# Patient Record
Sex: Female | Born: 1945
Health system: Southern US, Community
[De-identification: ages and names within clinical notes are randomized; demographics above are authoritative.]

## PROBLEM LIST (undated history)

## (undated) DIAGNOSIS — E785 Hyperlipidemia, unspecified: Secondary | ICD-10-CM

## (undated) DIAGNOSIS — R0683 Snoring: Secondary | ICD-10-CM

## (undated) DIAGNOSIS — E079 Disorder of thyroid, unspecified: Secondary | ICD-10-CM

## (undated) DIAGNOSIS — T7840XA Allergy, unspecified, initial encounter: Secondary | ICD-10-CM

## (undated) DIAGNOSIS — Z86010 Personal history of colonic polyps: Secondary | ICD-10-CM

## (undated) DIAGNOSIS — E7800 Pure hypercholesterolemia, unspecified: Secondary | ICD-10-CM

## (undated) DIAGNOSIS — K5900 Constipation, unspecified: Secondary | ICD-10-CM

## (undated) DIAGNOSIS — E119 Type 2 diabetes mellitus without complications: Secondary | ICD-10-CM

## (undated) DIAGNOSIS — N611 Abscess of the breast and nipple: Secondary | ICD-10-CM

## (undated) DIAGNOSIS — I10 Essential (primary) hypertension: Secondary | ICD-10-CM

## (undated) HISTORY — DX: Essential (primary) hypertension: I10

## (undated) HISTORY — DX: Constipation, unspecified: K59.00

## (undated) HISTORY — DX: Type 2 diabetes mellitus without complications: E11.9

## (undated) HISTORY — DX: Pure hypercholesterolemia, unspecified: E78.00

## (undated) HISTORY — DX: Abscess of the breast and nipple: N61.1

## (undated) HISTORY — DX: Snoring: R06.83

## (undated) HISTORY — DX: Allergy, unspecified, initial encounter: T78.40XA

## (undated) HISTORY — DX: Disorder of thyroid, unspecified: E07.9

## (undated) HISTORY — PX: POLYPECTOMY: SHX149

## (undated) HISTORY — DX: Hyperlipidemia, unspecified: E78.5

## (undated) HISTORY — DX: Personal history of colonic polyps: Z86.010

## (undated) HISTORY — PX: COLONOSCOPY: SHX174

---

## 1991-05-06 HISTORY — PX: ABDOMINAL HYSTERECTOMY: SHX81

## 2008-12-04 ENCOUNTER — Ambulatory Visit: Payer: Self-pay | Admitting: Internal Medicine

## 2008-12-19 ENCOUNTER — Ambulatory Visit: Payer: Self-pay | Admitting: Internal Medicine

## 2008-12-19 ENCOUNTER — Encounter: Payer: Self-pay | Admitting: Internal Medicine

## 2008-12-25 ENCOUNTER — Encounter: Payer: Self-pay | Admitting: Internal Medicine

## 2011-05-15 DIAGNOSIS — Z1231 Encounter for screening mammogram for malignant neoplasm of breast: Secondary | ICD-10-CM | POA: Diagnosis not present

## 2011-06-04 ENCOUNTER — Ambulatory Visit (INDEPENDENT_AMBULATORY_CARE_PROVIDER_SITE_OTHER): Payer: Medicare Other | Admitting: Family Medicine

## 2011-06-04 ENCOUNTER — Encounter: Payer: Self-pay | Admitting: Family Medicine

## 2011-06-04 VITALS — BP 112/61 | HR 76 | Temp 98.1°F | Resp 16 | Ht 64.0 in | Wt 117.0 lb

## 2011-06-04 DIAGNOSIS — F17200 Nicotine dependence, unspecified, uncomplicated: Secondary | ICD-10-CM | POA: Diagnosis not present

## 2011-06-04 DIAGNOSIS — Z720 Tobacco use: Secondary | ICD-10-CM

## 2011-06-04 DIAGNOSIS — E785 Hyperlipidemia, unspecified: Secondary | ICD-10-CM | POA: Diagnosis not present

## 2011-06-04 DIAGNOSIS — E119 Type 2 diabetes mellitus without complications: Secondary | ICD-10-CM | POA: Diagnosis not present

## 2011-06-04 MED ORDER — VARENICLINE TARTRATE 0.5 MG PO TABS: 0.5000 mg | ORAL_TABLET | Freq: Two times a day (BID) | ORAL | Status: AC

## 2011-06-04 MED ORDER — VARENICLINE TARTRATE 1 MG PO TABS: 1.0000 mg | ORAL_TABLET | Freq: Two times a day (BID) | ORAL | Status: AC

## 2011-06-04 MED ORDER — PRAVASTATIN SODIUM 40 MG PO TABS: 40.0000 mg | ORAL_TABLET | Freq: Every day | ORAL | Status: DC

## 2011-06-04 NOTE — Patient Instructions (Signed)
Smoking Cessation This document explains the best ways for you to quit smoking and new treatments to help. It lists new medicines that can double or triple your chances of quitting and quitting for good. It also considers ways to avoid relapses and concerns you may have about quitting, including weight gain. NICOTINE: A POWERFUL ADDICTION If you have tried to quit smoking, you know how hard it can be. It is hard because nicotine is a very addictive drug. For some people, it can be as addictive as heroin or cocaine. Usually, people make 2 or 3 tries, or more, before finally being able to quit. Each time you try to quit, you can learn about what helps and what hurts. Quitting takes hard work and a lot of effort, but you can quit smoking. QUITTING SMOKING IS ONE OF THE MOST IMPORTANT THINGS YOU WILL EVER DO.  You will live longer, feel better, and live better.   The impact on your body of quitting smoking is felt almost immediately:   Within 20 minutes, blood pressure decreases. Pulse returns to its normal level.   After 8 hours, carbon monoxide levels in the blood return to normal. Oxygen level increases.   After 24 hours, chance of heart attack starts to decrease. Breath, hair, and body stop smelling like smoke.   After 48 hours, damaged nerve endings begin to recover. Sense of taste and smell improve.   After 72 hours, the body is virtually free of nicotine. Bronchial tubes relax and breathing becomes easier.   After 2 to 12 weeks, lungs can hold more air. Exercise becomes easier and circulation improves.   Quitting will reduce your risk of having a heart attack, stroke, cancer, or lung disease:   After 1 year, the risk of coronary heart disease is cut in half.   After 5 years, the risk of stroke falls to the same as a nonsmoker.   After 10 years, the risk of lung cancer is cut in half and the risk of other cancers decreases significantly.   After 15 years, the risk of coronary heart  disease drops, usually to the level of a nonsmoker.   If you are pregnant, quitting smoking will improve your chances of having a healthy baby.   The people you live with, especially your children, will be healthier.   You will have extra money to spend on things other than cigarettes.  FIVE KEYS TO QUITTING Studies have shown that these 5 steps will help you quit smoking and quit for good. You have the best chances of quitting if you use them together: 1. Get ready.  2. Get support and encouragement.  3. Learn new skills and behaviors.  4. Get medicine to reduce your nicotine addiction and use it correctly.  5. Be prepared for relapse or difficult situations. Be determined to continue trying to quit, even if you do not succeed at first.  1. GET READY  Set a quit date.   Change your environment.   Get rid of ALL cigarettes, ashtrays, matches, and lighters in your home, car, and place of work.   Do not let people smoke in your home.   Review your past attempts to quit. Think about what worked and what did not.   Once you quit, do not smoke. NOT EVEN A PUFF!  2. GET SUPPORT AND ENCOURAGEMENT Studies have shown that you have a better chance of being successful if you have help. You can get support in many ways.  Tell   your family, friends, and coworkers that you are going to quit and need their support. Ask them not to smoke around you.   Talk to your caregivers (doctor, dentist, nurse, pharmacist, psychologist, and/or smoking counselor).   Get individual, group, or telephone counseling and support. The more counseling you have, the better your chances are of quitting. Programs are available at local hospitals and health centers. Call your local health department for information about programs in your area.   Spiritual beliefs and practices may help some smokers quit.   Quit meters are small computer programs online or downloadable that keep track of quit statistics, such as amount  of "quit-time," cigarettes not smoked, and money saved.   Many smokers find one or more of the many self-help books available useful in helping them quit and stay off tobacco.  3. LEARN NEW SKILLS AND BEHAVIORS  Try to distract yourself from urges to smoke. Talk to someone, go for a walk, or occupy your time with a task.   When you first try to quit, change your routine. Take a different route to work. Drink tea instead of coffee. Eat breakfast in a different place.   Do something to reduce your stress. Take a hot bath, exercise, or read a book.   Plan something enjoyable to do every day. Reward yourself for not smoking.   Explore interactive web-based programs that specialize in helping you quit.  4. GET MEDICINE AND USE IT CORRECTLY Medicines can help you stop smoking and decrease the urge to smoke. Combining medicine with the above behavioral methods and support can quadruple your chances of successfully quitting smoking. The U.S. Food and Drug Administration (FDA) has approved 7 medicines to help you quit smoking. These medicines fall into 3 categories.  Nicotine replacement therapy (delivers nicotine to your body without the negative effects and risks of smoking):   Nicotine gum: Available over-the-counter.   Nicotine lozenges: Available over-the-counter.   Nicotine inhaler: Available by prescription.   Nicotine nasal spray: Available by prescription.   Nicotine skin patches (transdermal): Available by prescription and over-the-counter.   Antidepressant medicine (helps people abstain from smoking, but how this works is unknown):   Bupropion sustained-release (SR) tablets: Available by prescription.   Nicotinic receptor partial agonist (simulates the effect of nicotine in your brain):   Varenicline tartrate tablets: Available by prescription.   Ask your caregiver for advice about which medicines to use and how to use them. Carefully read the information on the package.    Everyone who is trying to quit may benefit from using a medicine. If you are pregnant or trying to become pregnant, nursing an infant, you are under age 18, or you smoke fewer than 10 cigarettes per day, talk to your caregiver before taking any nicotine replacement medicines.   You should stop using a nicotine replacement product and call your caregiver if you experience nausea, dizziness, weakness, vomiting, fast or irregular heartbeat, mouth problems with the lozenge or gum, or redness or swelling of the skin around the patch that does not go away.   Do not use any other product containing nicotine while using a nicotine replacement product.   Talk to your caregiver before using these products if you have diabetes, heart disease, asthma, stomach ulcers, you had a recent heart attack, you have high blood pressure that is not controlled with medicine, a history of irregular heartbeat, or you have been prescribed medicine to help you quit smoking.  5. BE PREPARED FOR RELAPSE OR   DIFFICULT SITUATIONS  Most relapses occur within the first 3 months after quitting. Do not be discouraged if you start smoking again. Remember, most people try several times before they finally quit.   You may have symptoms of withdrawal because your body is used to nicotine. You may crave cigarettes, be irritable, feel very hungry, cough often, get headaches, or have difficulty concentrating.   The withdrawal symptoms are only temporary. They are strongest when you first quit, but they will go away within 10 to 14 days.  Here are some difficult situations to watch for:  Alcohol. Avoid drinking alcohol. Drinking lowers your chances of successfully quitting.   Caffeine. Try to reduce the amount of caffeine you consume. It also lowers your chances of successfully quitting.   Other smokers. Being around smoking can make you want to smoke. Avoid smokers.   Weight gain. Many smokers will gain weight when they quit, usually  less than 10 pounds. Eat a healthy diet and stay active. Do not let weight gain distract you from your main goal, quitting smoking. Some medicines that help you quit smoking may also help delay weight gain. You can always lose the weight gained after you quit.   Bad mood or depression. There are a lot of ways to improve your mood other than smoking.  If you are having problems with any of these situations, talk to your caregiver. SPECIAL SITUATIONS AND CONDITIONS Studies suggest that everyone can quit smoking. Your situation or condition can give you a special reason to quit.  Pregnant women/new mothers: By quitting, you protect your baby's health and your own.   Hospitalized patients: By quitting, you reduce health problems and help healing.   Heart attack patients: By quitting, you reduce your risk of a second heart attack.   Lung, head, and neck cancer patients: By quitting, you reduce your chance of a second cancer.   Parents of children and adolescents: By quitting, you protect your children from illnesses caused by secondhand smoke.  QUESTIONS TO THINK ABOUT Think about the following questions before you try to stop smoking. You may want to talk about your answers with your caregiver.  Why do you want to quit?   If you tried to quit in the past, what helped and what did not?   What will be the most difficult situations for you after you quit? How will you plan to handle them?   Who can help you through the tough times? Your family? Friends? Caregiver?   What pleasures do you get from smoking? What ways can you still get pleasure if you quit?  Here are some questions to ask your caregiver:  How can you help me to be successful at quitting?   What medicine do you think would be best for me and how should I take it?   What should I do if I need more help?   What is smoking withdrawal like? How can I get information on withdrawal?  Quitting takes hard work and a lot of effort,  but you can quit smoking. FOR MORE INFORMATION  Smokefree.gov (http://www.smokefree.gov) provides free, accurate, evidence-based information and professional assistance to help support the immediate and long-term needs of people trying to quit smoking. Document Released: 04/15/2001 Document Revised: 01/01/2011 Document Reviewed: 02/05/2009 [employer] Patient Information 2012 [employer]. 

## 2011-06-04 NOTE — Progress Notes (Signed)
  Subjective:    Patient ID: Terry Armstrong, female    DOB: Apr 22, 1946, 66 y.o.   MRN: [id number]  HPI 66 yo AA female who is recently retired and her for 39-month follow-up. She has no complaints       and no adverse effects with the current medications. She is ready to quit smoking and requests        "the little pill to help her quit". She was given a prescription several months ago but never filled it.        She denies hypoglycemia.        Review of Systems  Constitutional: Negative.   Respiratory: Negative.   Cardiovascular: Negative.   Gastrointestinal: Negative.   Musculoskeletal: Negative.   Skin: Negative.   Neurological: Negative.        Objective:   Physical Exam  Vitals reviewed. Constitutional: She is oriented to person, place, and time. She appears well-developed and well-nourished.  HENT:  Head: Normocephalic and atraumatic.  Neck: Normal range of motion. No thyromegaly present.  Cardiovascular: Normal rate, regular rhythm, normal heart sounds and intact distal pulses.   Pulmonary/Chest: Effort normal and breath sounds normal.  Abdominal: Soft. She exhibits no mass. There is no tenderness.  Musculoskeletal: Normal range of motion. She exhibits no edema.       Diabetic foot exam: Negative for abnormal foot shape or color, ulcerations. Normal sensation to light touch.  Neurological: She is alert and oriented to person, place, and time.  Skin: Skin is warm and dry.  Psychiatric: She has a normal mood and affect. Thought content normal.          Assessment & Plan:   1. Type II or unspecified type diabetes mellitus without mention of complication, not stated as uncontrolled  Well controlled, cont. Current medications  2. Tobacco user  Strongly advised Tobacco cessation, Rx: Chantix.   Started smoking at age 69. Today, pt is interested in cessation. Wants to try Chantix.  3. Hyperlipidemia LDL goal < 100  Cont. Pravastatin at current dose pending lab results.

## 2011-06-05 LAB — COMPREHENSIVE METABOLIC PANEL
AST: 13 U/L (ref 0–37)
Albumin: 4.5 g/dL (ref 3.5–5.2)
Alkaline Phosphatase: 67 U/L (ref 39–117)
BUN: 12 mg/dL (ref 6–23)
Potassium: 4.5 mEq/L (ref 3.5–5.3)
Sodium: 140 mEq/L (ref 135–145)
Total Bilirubin: 0.5 mg/dL (ref 0.3–1.2)
Total Protein: 6.7 g/dL (ref 6.0–8.3)

## 2011-06-05 LAB — LIPID PANEL
HDL: 54 mg/dL (ref >39)
LDL Cholesterol: 98 mg/dL (ref 0–99)
VLDL: 18 mg/dL (ref 0–40)

## 2011-06-05 NOTE — Progress Notes (Signed)
Quick Note:  Please notify pt that results are normal. ______ 

## 2012-01-28 ENCOUNTER — Ambulatory Visit (INDEPENDENT_AMBULATORY_CARE_PROVIDER_SITE_OTHER): Payer: Medicare Other | Admitting: Family Medicine

## 2012-01-28 VITALS — BP 128/64 | HR 75 | Temp 97.8°F | Resp 16 | Ht 64.0 in | Wt 120.0 lb

## 2012-01-28 DIAGNOSIS — L723 Sebaceous cyst: Secondary | ICD-10-CM | POA: Diagnosis not present

## 2012-01-28 DIAGNOSIS — E785 Hyperlipidemia, unspecified: Secondary | ICD-10-CM | POA: Diagnosis not present

## 2012-01-28 DIAGNOSIS — Z23 Encounter for immunization: Secondary | ICD-10-CM

## 2012-01-28 DIAGNOSIS — E119 Type 2 diabetes mellitus without complications: Secondary | ICD-10-CM

## 2012-01-28 DIAGNOSIS — L729 Follicular cyst of the skin and subcutaneous tissue, unspecified: Secondary | ICD-10-CM

## 2012-01-28 LAB — POCT GLYCOSYLATED HEMOGLOBIN (HGB A1C): Hemoglobin A1C: 5.4

## 2012-01-28 MED ORDER — METFORMIN HCL 500 MG PO TABS: 500.0000 mg | ORAL_TABLET | Freq: Two times a day (BID) | ORAL | Status: DC

## 2012-01-28 MED ORDER — PRAVASTATIN SODIUM 40 MG PO TABS: 40.0000 mg | ORAL_TABLET | Freq: Every day | ORAL | Status: DC

## 2012-01-28 NOTE — Progress Notes (Signed)
[facility] [address] [place] [address] [phone]  Date:  01/28/2012   Name:  Terry Armstrong   DOB:  21-Jun-1945   MRN:  [id number]  PCP:  No primary provider on file.    Chief Complaint: Cyst, Medication Refill and Flu Vaccine   History of Present Illness:  Terry Armstrong is a 67 y.o. very pleasant female patient who presents with the following:  She has noted a tender nodule on her left temple for about 2 weeks.  It has been getting larger.  She has not been able to get it to drain or otherwise resolve as of yet - she has been picking, squeezing, and trying to get it to drain.   She also needs to follow- up on her DM today- she is not fasting so will not check an FLP but her FLP in January of this year looked great.   Also desires her flu shot today  Patient Active Problem List  Diagnosis  . Type II or unspecified type diabetes mellitus without mention of complication, not stated as uncontrolled  . Tobacco user  . Hyperlipidemia LDL goal < 100    No past medical history on file.  Past Surgical History  Procedure Date  . Abdominal hysterectomy 1993    History  Substance Use Topics  . Smoking status: Current Every Day Smoker -- 1.0 packs/day for 28 years    Types: Cigarettes  . Smokeless tobacco: Not on file  . Alcohol Use: Not on file    Family History  Problem Relation Age of Onset  . Diabetes    . Hypertension    . Hyperlipidemia      Allergies  Allergen Reactions  . Aspirin     Medication list has been reviewed and updated.  Current Outpatient Prescriptions on File Prior to Visit  Medication Sig Dispense Refill  . metFORMIN (GLUCOPHAGE) 500 MG tablet Take 500 mg by mouth 2 (two) times daily with a meal.      . pravastatin (PRAVACHOL) 40 MG tablet Take 1 tablet (40 mg total) by mouth daily.  90 tablet  3    Review of Systems:  As per HPI- otherwise negative. Otherwise she feels well today and has no other  complaint  Physical Examination: Filed Vitals:   01/28/12 1238  BP: 128/64  Pulse: 75  Temp: 97.8 F (36.6 C)  Resp: 16   Filed Vitals:   01/28/12 1238  Height: 5\' 4"  (1.626 m)  Weight: 120 lb (54.432 kg)   Body mass index is 20.60 kg/(m^2). Ideal Body Weight: Weight in (lb) to have BMI = 25: 145.3   GEN: WDWN, NAD, Non-toxic, A & O x 3 HEENT: Atraumatic, Normocephalic. Neck supple. No masses, No LAD.  There is a small, round, moveable nodule palpable under the skin of her left temple, consistent with a cyst.  No pore or head, no inflammation Ears and Nose: No external deformity. CV: RRR, No M/G/R. No JVD. No thrill. No extra heart sounds. PULM: CTA B, no wheezes, crackles, rhonchi. No retractions. No resp. distress. No accessory muscle use. EXTR: No c/c/e NEURO Normal gait.  PSYCH: Normally interactive. Conversant. Not depressed or anxious appearing.  Calm demeanor.   Results for orders placed in visit on 01/28/12  POCT GLYCOSYLATED HEMOGLOBIN (HGB A1C)      Component Value Range   Hemoglobin A1C 5.4      Assessment and Plan: 1. Hyperlipidemia LDL goal < 100  pravastatin (  PRAVACHOL) 40 MG tablet  2. DM (diabetes mellitus)  metFORMIN (GLUCOPHAGE) 500 MG tablet, Flu vaccine greater than or equal to 3yo preservative free IM, POCT glycosylated hemoglobin (Hb A1C)  3. Skin cyst     Refilled pravacol, and plan to recheck FLP after the holidays.   DM is well controlled- continue metformin Give flu vaccine Cyst on face: this may resolve with watchful waiting.  She will not pick at it- if not resolved in 2 weeks she will call and I will send her to derm to have this removed.   Abbe Amsterdam, MD

## 2012-02-03 DIAGNOSIS — L723 Sebaceous cyst: Secondary | ICD-10-CM | POA: Diagnosis not present

## 2012-03-25 ENCOUNTER — Ambulatory Visit (INDEPENDENT_AMBULATORY_CARE_PROVIDER_SITE_OTHER): Payer: Medicare Other | Admitting: Emergency Medicine

## 2012-03-25 ENCOUNTER — Ambulatory Visit: Payer: Medicare Other

## 2012-03-25 VITALS — BP 120/78 | HR 78 | Temp 98.6°F | Resp 18 | Ht 65.0 in | Wt 119.2 lb

## 2012-03-25 DIAGNOSIS — S6990XA Unspecified injury of unspecified wrist, hand and finger(s), initial encounter: Secondary | ICD-10-CM | POA: Diagnosis not present

## 2012-03-25 DIAGNOSIS — M79646 Pain in unspecified finger(s): Secondary | ICD-10-CM

## 2012-03-25 DIAGNOSIS — M79609 Pain in unspecified limb: Secondary | ICD-10-CM | POA: Diagnosis not present

## 2012-03-25 DIAGNOSIS — S6980XA Other specified injuries of unspecified wrist, hand and finger(s), initial encounter: Secondary | ICD-10-CM | POA: Diagnosis not present

## 2012-03-25 NOTE — Progress Notes (Signed)
  Subjective:    Patient ID: Terry Armstrong, female    DOB: [birth year], 66 y.o.   MRN: [id number]  HPIpresents today with R index finger injury. Bowling Tuesday am and finger got smashed in between the balls.    Review of Systems     Objective:   Physical Exam is tenderness and swelling over the flexor pad of the right index finger. She has mild decreased range of motion at the DIP joint. There is no instability   [facility] reading (PRIMARY) by  Dr. Cleta Alberts no fracture is seen       Assessment & Plan:  We'll treat with splint, ice, elevation

## 2012-04-19 ENCOUNTER — Ambulatory Visit (INDEPENDENT_AMBULATORY_CARE_PROVIDER_SITE_OTHER): Payer: Medicare Other | Admitting: Family Medicine

## 2012-04-19 ENCOUNTER — Encounter: Payer: Self-pay | Admitting: Family Medicine

## 2012-04-19 VITALS — BP 114/67 | HR 73 | Temp 98.4°F | Resp 16 | Ht 65.0 in | Wt 118.0 lb

## 2012-04-19 DIAGNOSIS — E785 Hyperlipidemia, unspecified: Secondary | ICD-10-CM | POA: Diagnosis not present

## 2012-04-19 DIAGNOSIS — R102 Pelvic and perineal pain: Secondary | ICD-10-CM

## 2012-04-19 DIAGNOSIS — N760 Acute vaginitis: Secondary | ICD-10-CM

## 2012-04-19 DIAGNOSIS — R350 Frequency of micturition: Secondary | ICD-10-CM | POA: Diagnosis not present

## 2012-04-19 DIAGNOSIS — F17200 Nicotine dependence, unspecified, uncomplicated: Secondary | ICD-10-CM | POA: Diagnosis not present

## 2012-04-19 DIAGNOSIS — K5900 Constipation, unspecified: Secondary | ICD-10-CM | POA: Diagnosis not present

## 2012-04-19 DIAGNOSIS — E119 Type 2 diabetes mellitus without complications: Secondary | ICD-10-CM | POA: Diagnosis not present

## 2012-04-19 DIAGNOSIS — B9689 Other specified bacterial agents as the cause of diseases classified elsewhere: Secondary | ICD-10-CM

## 2012-04-19 DIAGNOSIS — Z720 Tobacco use: Secondary | ICD-10-CM

## 2012-04-19 DIAGNOSIS — A499 Bacterial infection, unspecified: Secondary | ICD-10-CM | POA: Diagnosis not present

## 2012-04-19 LAB — POCT UA - MICROSCOPIC ONLY
Casts, Ur, LPF, POC: NEGATIVE
Crystals, Ur, HPF, POC: NEGATIVE
Mucus, UA: NEGATIVE
Yeast, UA: NEGATIVE

## 2012-04-19 LAB — POCT URINALYSIS DIPSTICK
Bilirubin, UA: NEGATIVE
Blood, UA: NEGATIVE
Glucose, UA: NEGATIVE
Ketones, UA: NEGATIVE
Nitrite, UA: NEGATIVE
Protein, UA: NEGATIVE
Spec Grav, UA: 1.025
Urobilinogen, UA: 2
pH, UA: 6

## 2012-04-19 LAB — POCT WET PREP WITH KOH: Clue Cells Wet Prep HPF POC: 100

## 2012-04-19 MED ORDER — CEFTRIAXONE SODIUM 1 G IJ SOLR
1.0000 g | Freq: Once | INTRAMUSCULAR | Status: AC
Start: 2012-04-19 — End: 2012-04-19
  Administered 2012-04-19: 1 g via INTRAMUSCULAR

## 2012-04-19 MED ORDER — METRONIDAZOLE 500 MG PO TABS: 500.0000 mg | ORAL_TABLET | Freq: Two times a day (BID) | ORAL | Status: DC

## 2012-04-19 MED ORDER — PHENAZOPYRIDINE HCL 200 MG PO TABS: 200.0000 mg | ORAL_TABLET | Freq: Three times a day (TID) | ORAL | Status: DC | PRN

## 2012-04-19 MED ORDER — POLYETHYLENE GLYCOL 3350 17 GM/SCOOP PO POWD: 17.0000 g | Freq: Two times a day (BID) | ORAL | Status: DC | PRN

## 2012-04-19 NOTE — Progress Notes (Signed)
Subjective:    Patient ID: Terry Armstrong, female    DOB: 1945-12-09, 66 y.o.   MRN: [id number]  HPI  Terry Armstrong is a delightful 66 yo woman who presents with 1 wk of dark urine and urinary odor - esp if she has not urinated in a while. No dysuria or increased frequency. She has had similar sxs with UTI in the past so she tried cranberry juice and water but didn't improve. She presented here today when she developed right pelvic pain.  Has been having some constipation recently and tried 3 laxative pills w/o much relief.   Has not had a pelvic exam since 2009 since she no longer needs pap smears and is still sexually active.   Had total HSO and BSO in 1986 - for fibroids so went through early menopausea, never had any hormone therapy after TAH/BSO despite young age.    No past medical history on file. Current Outpatient Prescriptions on File Prior to Visit  Medication Sig Dispense Refill  . metFORMIN (GLUCOPHAGE) 500 MG tablet Take 1 tablet (500 mg total) by mouth 2 (two) times daily with a meal.  180 tablet  3  . pravastatin (PRAVACHOL) 40 MG tablet Take 1 tablet (40 mg total) by mouth daily.  90 tablet  3   Past Surgical History  Procedure Date  . Abdominal hysterectomy - total w/ BSO - for fibroids, no malignancy 1986 vs 1994     Review of Systems  Constitutional: Positive for diaphoresis. Negative for fever, chills, activity change, appetite change and unexpected weight change.  Gastrointestinal: Positive for abdominal pain and constipation. Negative for nausea and vomiting.  Genitourinary: Positive for hematuria and pelvic pain. Negative for dysuria, frequency, flank pain, decreased urine volume, vaginal bleeding, vaginal discharge, difficulty urinating, genital sores and menstrual problem.  Musculoskeletal: Negative for back pain.      BP 114/67  Pulse 73  Temp 98.4 F (36.9 C) (Oral)  Resp 16  Ht 5\' 5"  (1.651 m)  Wt 118 lb (53.524 kg)  BMI 19.64 kg/m2  SpO2 99% Objective:    Physical Exam  Constitutional: She is oriented to person, place, and time. She appears well-developed and well-nourished. No distress.  HENT:  Head: Normocephalic and atraumatic.  Cardiovascular: Normal rate, regular rhythm, normal heart sounds and intact distal pulses.   Pulmonary/Chest: Effort normal and breath sounds normal.  Abdominal: Soft. Bowel sounds are normal. She exhibits no distension. There is no hepatosplenomegaly. There is tenderness in the right lower quadrant. There is no rigidity, no rebound, no guarding, no CVA tenderness, no tenderness at McBurney's point and negative Murphy's sign. No hernia.  Genitourinary: Rectum normal. Pelvic exam was performed with patient supine. There is no rash, tenderness or lesion on the right labia. There is no rash, tenderness or lesion on the left labia. Right adnexum displays tenderness. Right adnexum displays no mass and no fullness. Left adnexum displays no mass, no tenderness and no fullness. No erythema or tenderness around the vagina. Vaginal discharge found.       Cervix and uterus surgically absent  Lymphadenopathy:       Right: No inguinal adenopathy present.       Left: No inguinal adenopathy present.  Neurological: She is alert and oriented to person, place, and time.  Skin: Skin is warm and dry. She is not diaphoretic.  Psychiatric: She has a normal mood and affect. Her behavior is normal.      Results for orders placed in visit on  04/19/12  POCT UA - MICROSCOPIC ONLY      Component Value Range   WBC, Ur, HPF, POC 0-1     RBC, urine, microscopic 0-1     Bacteria, U Microscopic trace     Mucus, UA neg     Epithelial cells, urine per micros 0-3     Crystals, Ur, HPF, POC neg     Casts, Ur, LPF, POC neg     Yeast, UA neg    POCT URINALYSIS DIPSTICK      Component Value Range   Color, UA yellow     Clarity, UA clear     Glucose, UA neg     Bilirubin, UA neg     Ketones, UA neg     Spec Grav, UA 1.025     Blood, UA neg      pH, UA 6.0     Protein, UA neg     Urobilinogen, UA 2.0     Nitrite, UA neg     Leukocytes, UA Trace    IFOBT (OCCULT BLOOD)      Component Value Range   IFOBT Negative    POCT WET PREP WITH KOH      Component Value Range   Trichomonas, UA Negative     Clue Cells Wet Prep HPF POC 100%     Epithelial Wet Prep HPF POC 5-15     Yeast Wet Prep HPF POC neg     Bacteria Wet Prep HPF POC 4++     RBC Wet Prep HPF POC 10-20     WBC Wet Prep HPF POC 0-5     KOH Prep POC Negative     Assessment & Plan:   1. Urinary frequency  POCT UA - Microscopic Only, POCT urinalysis dipstick, phenazopyridine (PYRIDIUM) 200 MG tablet, Urine culture, cefTRIAXone (ROCEPHIN) injection 1 g.  UA looks completley normal but due to pt's hx, will give 1g Rocepin IM x 1 now to hold pt until urine clture results return before we put her on a full course of abx course.  2. Pelvic pain - ddx inc possible uti, constipation, or BV. UClx P, treat BM and constipation. If no improvement or ever worsens, rtc immed for further eval. IFOBT POC (occult bld, rslt in office) neg, POCT Wet Prep with KOH, GC/chlamydia probe amp, genital  3. Constipation  polyethylene glycol powder (GLYCOLAX/MIRALAX) powder  4. Hyperlipidemia LDL goal < 100    5. Tobacco user    6. Type II or unspecified type diabetes mellitus without mention of complication, not stated as uncontrolled    7. Bacterial vaginosis  metroNIDAZOLE (FLAGYL) 500 MG tablet

## 2012-04-19 NOTE — Patient Instructions (Signed)
Try miralax and colace (docusate) and drink plenty of water and a high fiber diet.

## 2012-05-19 DIAGNOSIS — Z1231 Encounter for screening mammogram for malignant neoplasm of breast: Secondary | ICD-10-CM | POA: Diagnosis not present

## 2012-10-29 ENCOUNTER — Ambulatory Visit (INDEPENDENT_AMBULATORY_CARE_PROVIDER_SITE_OTHER): Payer: Medicare Other | Admitting: Family Medicine

## 2012-10-29 VITALS — BP 100/70 | HR 75 | Temp 98.0°F | Resp 16 | Ht 66.0 in | Wt 118.0 lb

## 2012-10-29 DIAGNOSIS — H93239 Hyperacusis, unspecified ear: Secondary | ICD-10-CM

## 2012-10-29 DIAGNOSIS — H6123 Impacted cerumen, bilateral: Secondary | ICD-10-CM

## 2012-10-29 DIAGNOSIS — H6120 Impacted cerumen, unspecified ear: Secondary | ICD-10-CM | POA: Diagnosis not present

## 2012-10-29 DIAGNOSIS — H93233 Hyperacusis, bilateral: Secondary | ICD-10-CM

## 2012-10-29 NOTE — Progress Notes (Addendum)
Subjective: Patient is here complaining of not being able to hear. She says she has wax in her years. She tried to get it out unsuccessfully. Cannot hear  Objective: Bilateral plugs in her years. Throat is clear. Speaking loudly and cannot hear well  Assessment: Cerumen impaction Hearing loss temporary do to cerumen  Plan: Have my assistant irrigate ears out.  Recheck of ears reveals a eardrums look normal. Is just a tiny bit of residual wax or old dry skin in both canals. She was instructed in is about at home.

## 2012-10-29 NOTE — Patient Instructions (Addendum)
In a couple of days repeat using the ear wax drops that you haven't home and try to clean the remainder of the wax out of the years.  About every 4 months makes in a routine to try to flush ears out. Use earwax softening drops. Keep that on your calendar. That can help you do not have to come in to get this done.

## 2012-11-23 ENCOUNTER — Telehealth: Payer: Self-pay

## 2012-11-23 DIAGNOSIS — N63 Unspecified lump in unspecified breast: Secondary | ICD-10-CM | POA: Diagnosis not present

## 2012-11-23 NOTE — Telephone Encounter (Signed)
Spoke w/Dr Clelia Croft and she Ok'd order for MM but asked for me to call pt and let her know MM is only one aspect of assessment. She should still come in for exam possibly after the MM. Called pt and she thanked [place] and agreed to come in and see Dr Clelia Croft Thurs morning at 102. I am putting in order for MM.

## 2012-11-23 NOTE — Telephone Encounter (Signed)
[facility] called and stated pt has appt scheduled today at 1:45 for Dx MM left breast for swelling/possible cyst. Dr Clelia Croft, pt asked if you can refer her for this?

## 2012-11-23 NOTE — Telephone Encounter (Signed)
Needs a referral,today, for her appointment at 1.

## 2012-11-25 ENCOUNTER — Other Ambulatory Visit: Payer: Self-pay | Admitting: Radiology

## 2012-11-25 DIAGNOSIS — N6489 Other specified disorders of breast: Secondary | ICD-10-CM | POA: Diagnosis not present

## 2012-11-25 DIAGNOSIS — N61 Mastitis without abscess: Secondary | ICD-10-CM | POA: Diagnosis not present

## 2012-11-30 ENCOUNTER — Encounter (INDEPENDENT_AMBULATORY_CARE_PROVIDER_SITE_OTHER): Payer: Self-pay | Admitting: Surgery

## 2012-11-30 ENCOUNTER — Ambulatory Visit (INDEPENDENT_AMBULATORY_CARE_PROVIDER_SITE_OTHER): Payer: Medicare Other | Admitting: Surgery

## 2012-11-30 VITALS — BP 110/60 | HR 62 | Resp 14 | Ht 66.0 in | Wt 119.0 lb

## 2012-11-30 DIAGNOSIS — N611 Abscess of the breast and nipple: Secondary | ICD-10-CM

## 2012-11-30 DIAGNOSIS — N61 Mastitis without abscess: Secondary | ICD-10-CM | POA: Diagnosis not present

## 2012-11-30 NOTE — Progress Notes (Signed)
Chief Complaint:  Left breast abscess at 12:00 treated by Dr. Yolanda Bonine with biopsy and doxycycline  History of Present Illness:  Terry Armstrong is an 67 y.o. female referred by Dr. Isabell Jarvis having had a tender area in the left breast with some suspicious findings on mammogram that required biopsy which showed inflammatory changes. Meanwhile an apparent breast abscesses drained spontaneously and while on doxycycline is begun to completely resolve. At the 12:00 position there is a little area that slightly excoriated and still draining some.  I think that she should finish our course of doxycycline over the next 3 days. Continue using warm compresses and shower. As long as this infection has resolved I will not need to see her again.    Past Medical History  Diagnosis Date  . Diabetes mellitus without complication     Past Surgical History  Procedure Laterality Date  . Abdominal hysterectomy  1993    Current Outpatient Prescriptions  Medication Sig Dispense Refill  . fish oil-omega-3 fatty acids 1000 MG capsule Take 2 g by mouth daily.      . metFORMIN (GLUCOPHAGE) 500 MG tablet Take 500 mg by mouth 2 (two) times daily with a meal.      . metroNIDAZOLE (FLAGYL) 500 MG tablet Take 1 tablet (500 mg total) by mouth 2 (two) times daily with a meal. DO NOT CONSUME ALCOHOL WHILE TAKING THIS MEDICATION.  14 tablet  0  . multivitamin-iron-minerals-folic acid (CENTRUM) chewable tablet Chew 1 tablet by mouth daily.      . phenazopyridine (PYRIDIUM) 200 MG tablet Take 1 tablet (200 mg total) by mouth 3 (three) times daily as needed for pain.  10 tablet  0  . polyethylene glycol powder (GLYCOLAX/MIRALAX) powder Take 17 g by mouth 2 (two) times daily as needed.  527 g  5  . pravastatin (PRAVACHOL) 40 MG tablet Take 1 tablet (40 mg total) by mouth daily.  90 tablet  3   No current facility-administered medications for this visit.   Aspirin Family History  Problem Relation Age of Onset  . Diabetes    .  Hypertension    . Hyperlipidemia     Social History:   reports that she has been smoking Cigarettes.  She has a 28 pack-year smoking history. She has never used smokeless tobacco. She reports that she does not drink alcohol or use illicit drugs.   REVIEW OF SYSTEMS - PERTINENT POSITIVES ONLY: noncontributory  Physical Exam:   Blood pressure 110/60, pulse 62, resp. rate 14, height 5\' 6"  (1.676 m), weight 119 lb (53.978 kg). Body mass index is 19.22 kg/(m^2).  Gen:  WDWN AAF NAD  Neurological: Alert and oriented to person, place, and time. Motor and sensory function is grossly intact  Head: Normocephalic and atraumatic.  Eyes: Conjunctivae are normal. Pupils are equal, round, and reactive to light. No scleral icterus.  Breast: The left breast at 12 oclock has a 0.5 cm area of healing excoriation.  Nontender. Marland Kitchen  LABORATORY RESULTS: No results found for this or any previous visit (from the past 48 hour(s)).  RADIOLOGY RESULTS: No results found.  Problem List: Patient Active Problem List   Diagnosis Date Noted  . Type II or unspecified type diabetes mellitus without mention of complication, not stated as uncontrolled 06/04/2011  . Tobacco user 06/04/2011  . Hyperlipidemia LDL goal < 100 06/04/2011    Assessment & Plan: Healing left breast abscess  Complete course of Doxycycline Return prn    Matt B. Daphine Deutscher, MD,  [facility] [phone] beeper [phone]  11/30/2012 3:43 PM

## 2012-11-30 NOTE — Patient Instructions (Addendum)
Thanks for your patience.  If you need further assistance after leaving the office, please call our office and speak with a [facility] nurse.  [phone].  If you want to leave a message for Dr. Mabel Roll, please call his office phone at [phone]. 

## 2012-12-02 ENCOUNTER — Encounter: Payer: Self-pay | Admitting: Family Medicine

## 2012-12-06 ENCOUNTER — Encounter: Payer: Self-pay | Admitting: Family Medicine

## 2013-01-06 DIAGNOSIS — Z09 Encounter for follow-up examination after completed treatment for conditions other than malignant neoplasm: Secondary | ICD-10-CM | POA: Diagnosis not present

## 2013-01-10 ENCOUNTER — Encounter (INDEPENDENT_AMBULATORY_CARE_PROVIDER_SITE_OTHER): Payer: Self-pay

## 2013-01-17 ENCOUNTER — Encounter (INDEPENDENT_AMBULATORY_CARE_PROVIDER_SITE_OTHER): Payer: Self-pay

## 2013-01-19 ENCOUNTER — Encounter (INDEPENDENT_AMBULATORY_CARE_PROVIDER_SITE_OTHER): Payer: Self-pay

## 2013-01-21 ENCOUNTER — Encounter (INDEPENDENT_AMBULATORY_CARE_PROVIDER_SITE_OTHER): Payer: Self-pay

## 2013-01-28 ENCOUNTER — Encounter: Payer: Self-pay | Admitting: *Deleted

## 2013-01-28 DIAGNOSIS — N611 Abscess of the breast and nipple: Secondary | ICD-10-CM

## 2013-02-01 ENCOUNTER — Encounter (INDEPENDENT_AMBULATORY_CARE_PROVIDER_SITE_OTHER): Payer: Self-pay

## 2013-03-16 ENCOUNTER — Other Ambulatory Visit: Payer: Self-pay | Admitting: Family Medicine

## 2013-03-17 ENCOUNTER — Other Ambulatory Visit: Payer: Self-pay | Admitting: Family Medicine

## 2013-04-08 ENCOUNTER — Encounter: Payer: Self-pay | Admitting: Family Medicine

## 2013-04-08 ENCOUNTER — Ambulatory Visit (INDEPENDENT_AMBULATORY_CARE_PROVIDER_SITE_OTHER): Payer: Medicare Other | Admitting: Family Medicine

## 2013-04-08 VITALS — BP 152/80 | HR 77 | Temp 98.3°F | Resp 16 | Ht 64.0 in | Wt 120.2 lb

## 2013-04-08 DIAGNOSIS — Z720 Tobacco use: Secondary | ICD-10-CM

## 2013-04-08 DIAGNOSIS — R5381 Other malaise: Secondary | ICD-10-CM | POA: Diagnosis not present

## 2013-04-08 DIAGNOSIS — Z23 Encounter for immunization: Secondary | ICD-10-CM

## 2013-04-08 DIAGNOSIS — R0609 Other forms of dyspnea: Secondary | ICD-10-CM

## 2013-04-08 DIAGNOSIS — E785 Hyperlipidemia, unspecified: Secondary | ICD-10-CM

## 2013-04-08 DIAGNOSIS — F17200 Nicotine dependence, unspecified, uncomplicated: Secondary | ICD-10-CM | POA: Diagnosis not present

## 2013-04-08 DIAGNOSIS — R0683 Snoring: Secondary | ICD-10-CM

## 2013-04-08 DIAGNOSIS — M654 Radial styloid tenosynovitis [de Quervain]: Secondary | ICD-10-CM

## 2013-04-08 DIAGNOSIS — M899 Disorder of bone, unspecified: Secondary | ICD-10-CM

## 2013-04-08 DIAGNOSIS — R030 Elevated blood-pressure reading, without diagnosis of hypertension: Secondary | ICD-10-CM

## 2013-04-08 DIAGNOSIS — E119 Type 2 diabetes mellitus without complications: Secondary | ICD-10-CM

## 2013-04-08 DIAGNOSIS — M8580 Other specified disorders of bone density and structure, unspecified site: Secondary | ICD-10-CM

## 2013-04-08 LAB — COMPREHENSIVE METABOLIC PANEL
AST: 15 U/L (ref 0–37)
Albumin: 4.4 g/dL (ref 3.5–5.2)
Alkaline Phosphatase: 68 U/L (ref 39–117)
BUN: 13 mg/dL (ref 6–23)
Calcium: 9.4 mg/dL (ref 8.4–10.5)
Chloride: 105 mEq/L (ref 96–112)
Creat: 0.61 mg/dL (ref 0.50–1.10)

## 2013-04-08 LAB — CBC WITH DIFFERENTIAL/PLATELET
Hemoglobin: 14.2 g/dL (ref 12.0–15.0)
Lymphocytes Relative: 30 % (ref 12–46)
Lymphs Abs: 2.5 10*3/uL (ref 0.7–4.0)
Monocytes Relative: 6 % (ref 3–12)
Neutro Abs: 5.1 10*3/uL (ref 1.7–7.7)
Neutrophils Relative %: 60 % (ref 43–77)
RBC: 4.29 MIL/uL (ref 3.87–5.11)
WBC: 8.3 10*3/uL (ref 4.0–10.5)

## 2013-04-08 LAB — LIPID PANEL
Cholesterol: 214 mg/dL — ABNORMAL HIGH (ref 0–200)
HDL: 70 mg/dL (ref >39)
LDL Cholesterol: 132 mg/dL — ABNORMAL HIGH (ref 0–99)
Total CHOL/HDL Ratio: 3.1 Ratio
Triglycerides: 59 mg/dL (ref <150)
VLDL: 12 mg/dL (ref 0–40)

## 2013-04-08 LAB — TSH: TSH: 0.306 u[IU]/mL — ABNORMAL LOW (ref 0.350–4.500)

## 2013-04-08 LAB — MICROALBUMIN / CREATININE URINE RATIO: Microalb Creat Ratio: 5.6 mg/g (ref 0.0–30.0)

## 2013-04-08 MED ORDER — ZOSTER VACCINE LIVE 19400 UNT/0.65ML ~~LOC~~ SOLR: 0.6500 mL | Freq: Once | SUBCUTANEOUS | Status: DC

## 2013-04-08 MED ORDER — METFORMIN HCL 500 MG PO TABS: 500.0000 mg | ORAL_TABLET | Freq: Every day | ORAL | Status: DC

## 2013-04-08 NOTE — Patient Instructions (Addendum)
I am seen at [facility] on [facility] - they are very good.  You could also go to [facility] or any other eye doctor that you prefer.  Recommend making an appointment for a wellness exam in 6 mos.  Check your blood pressure at home once a week to ensure it is normal - <130/85. If it is elevated, please return to clinic so we can discuss the need for a blood pressure medication.  Ice the area of left wrist pain at the base of your thumb 3-4 times/day for 20 minutes and wear the thumb spica splint for at least 3 to 4 weeks. After your pain is completely gone, you can take of the splint and start doing stretching exercises with the wrist. If the pain does not go away in 1 month despite icing and splint, please return to clinic. OK to use otc ibuprofen or aleve as needed.  De Quervain's Disease Suzette Battiest disease is a condition often seen in racquet sports where there is a soreness (inflammation) in the cord like structures (tendons) which attach muscle to bone on the thumb side of the wrist. There may be a tightening of the tissuesaround the tendons. This condition is often helped by giving up or modifying the activity which caused it. When conservative treatment does not help, surgery may be required. Conservative treatment could include changes in the activity which brought about the problem or made it worse. Anti-inflammatory medications and injections may be used to help decrease the inflammation and help with pain control. Your caregiver will help you determine which is best for you. DIAGNOSIS  Often the diagnosis (learning what is wrong) can be made by examination. Sometimes x-rays are required. HOME CARE INSTRUCTIONS   Apply ice to the sore area for 15-20 minutes, 03-04 times per day while awake. Put the ice in a plastic bag and place a towel between the bag of ice and your skin. This is especially helpful if it can be done after all activities involving the sore wrist.  Temporary  splinting may help.  Only take over-the-counter or prescription medicines for pain, discomfort or fever as directed by your caregiver. SEEK MEDICAL CARE IF:   Pain relief is not obtained with medications, or if you have increasing pain and seem to be getting worse rather than better. MAKE SURE YOU:   Understand these instructions.  Will watch your condition.  Will get help right away if you are not doing well or get worse. Document Released: 01/14/2001 Document Revised: 07/14/2011 Document Reviewed: 04/21/2005 [facility] Patient Information 2014 [place], [place].   DASH Diet The DASH diet stands for "Dietary Approaches to Stop Hypertension." It is a healthy eating plan that has been shown to reduce high blood pressure (hypertension) in as little as 14 days, while also possibly providing other significant health benefits. These other health benefits include reducing the risk of breast cancer after menopause and reducing the risk of type 2 diabetes, heart disease, colon cancer, and stroke. Health benefits also include weight loss and slowing kidney failure in patients with chronic kidney disease.  DIET GUIDELINES  Limit salt (sodium). Your diet should contain less than 1500 mg of sodium daily.  Limit refined or processed carbohydrates. Your diet should include mostly whole grains. Desserts and added sugars should be used sparingly.  Include small amounts of heart-healthy fats. These types of fats include nuts, oils, and tub margarine. Limit saturated and trans fats. These fats have been shown to be harmful in the  body. CHOOSING FOODS  The following food groups are based on a 2000 calorie diet. See your Registered Dietitian for individual calorie needs. Grains and Grain Products (6 to 8 servings daily)  Eat More Often: Whole-wheat bread, brown rice, whole-grain or wheat pasta, quinoa, popcorn without added fat or salt (air popped).  Eat Less Often: White bread, white pasta, white rice,  cornbread. Vegetables (4 to 5 servings daily)  Eat More Often: Fresh, frozen, and canned vegetables. Vegetables may be raw, steamed, roasted, or grilled with a minimal amount of fat.  Eat Less Often/Avoid: Creamed or fried vegetables. Vegetables in a cheese sauce. Fruit (4 to 5 servings daily)  Eat More Often: All fresh, canned (in natural juice), or frozen fruits. Dried fruits without added sugar. One hundred percent fruit juice ( cup [237 mL] daily).  Eat Less Often: Dried fruits with added sugar. Canned fruit in light or heavy syrup. [employer], Fish, and Poultry (2 servings or less daily. One serving is 3 to 4 oz [85-114 g]).  Eat More Often: Ninety percent or leaner ground beef, tenderloin, sirloin. Round cuts of beef, chicken breast, [place] breast. All fish. Grill, bake, or broil your meat. Nothing should be fried.  Eat Less Often/Avoid: Fatty cuts of meat, [place], or chicken leg, thigh, or wing. Fried cuts of meat or fish. Dairy (2 to 3 servings)  Eat More Often: Low-fat or fat-free milk, low-fat plain or light yogurt, reduced-fat or part-skim cheese.  Eat Less Often/Avoid: Milk (whole, 2%).Whole milk yogurt. Full-fat cheeses. Nuts, Seeds, and Legumes (4 to 5 servings per week)  Eat More Often: All without added salt.  Eat Less Often/Avoid: Salted nuts and seeds, canned beans with added salt. Fats and Sweets (limited)  Eat More Often: Vegetable oils, tub margarines without trans fats, sugar-free gelatin. Mayonnaise and salad dressings.  Eat Less Often/Avoid: Coconut oils, palm oils, butter, stick margarine, cream, half and half, cookies, candy, pie. FOR MORE INFORMATION The Dash Diet Eating Plan: www.dashdiet.org Document Released: 04/10/2011 Document Revised: 07/14/2011 Document Reviewed: 04/10/2011 [facility] Patient Information 2014 [place], [place].

## 2013-04-08 NOTE — Progress Notes (Signed)
Subjective:    Patient ID: Terry Armstrong, female    DOB: Feb 08, 1946, 67 y.o.   MRN: [id number] Chief Complaint  Patient presents with  . Follow-up    blood pressure     HPI  Does have a BP cuff at home which her husband uses and is used to cooking w/ a low salt diet due to her husbands HTN.   Not checking cbgs outside of the office.  On metformin 500 bid. Fasting today.  Smoking a little less than 1 ppd. Chantix was very expensive and stopped working as soon as she went off of it after the first mo. No claudication  Moved here in 2009 from [place] - had shingles around 2006-7.  Aspirin caused itching and swelling.  No eye exam since 2009. Taking fish oil everday. Taking good care of feet. Feet stay cold all the time but no numbness or tingling.  Husband tells her that she stops breathing overnight, occasinally snore so bad he has to wake her up. Wakes up fatigued. No daytime napping but stays very busy.  C/o 2 wks of pain in her left radial styloid aspect of wrist that shoots up her forearm - throbbing, trouble grasping, dropping things, really hurts her at night.  Would like a stress test - is concerned that she is at increased risk for heart disease due to her medical comorbidities and smoking, is asymptomatic.  Pneumovax 2012 TDaP 2010 No prior zostavax - gave rx today. Mammogram with biopsies done at [facility] Colonoscopy: DEXA: was ordered on 05/2011 but looks like never done - last DEXA scan per [facility] was 01/10/2009  Past Medical History  Diagnosis Date  . Diabetes mellitus without complication   . Breast abscess   . Hyperlipidemia   . Primary snoring    Past Surgical History  Procedure Laterality Date  . Abdominal hysterectomy  1993   Current Outpatient Prescriptions on File Prior to Visit  Medication Sig Dispense Refill  . multivitamin-iron-minerals-folic acid (CENTRUM) chewable tablet Chew 1 tablet by mouth daily.      . polyethylene glycol powder  (GLYCOLAX/MIRALAX) powder Take 17 g by mouth 2 (two) times daily as needed.  527 g  5  . pravastatin (PRAVACHOL) 40 MG tablet Take 1 tablet (40 mg total) by mouth daily. PATIENT NEEDS OFFICE VISIT FOR ADDITIONAL REFILLS  30 tablet  0   No current facility-administered medications on file prior to visit.   Allergies  Allergen Reactions  . Aspirin     Swelling, scratching in throat   Family History  Problem Relation Age of Onset  . Diabetes    . Hypertension Sister   . Hyperlipidemia    . Heart disease Sister     defibrillator  . Kidney failure Maternal Aunt     dialysis  . Hypertension Maternal Aunt   . Heart Problems Maternal Grandmother   . Cancer Maternal Grandmother    History   Social History  . Marital Status: Single    Spouse Name: N/A    Number of Children: 0  . Years of Education: 14   Occupational History  .     Social History Main Topics  . Smoking status: Current Every Day Smoker -- 1.00 packs/day for 28 years    Types: Cigarettes  . Smokeless tobacco: Never Used  . Alcohol Use: Yes     Comment: social  . Drug Use: No  . Sexual Activity: None   Other Topics Concern  . None  Social History Narrative   Patient lives with her significant other.   Patient is retired.   Patient drinks 2-3 cups of caffeine daily in the winter.   Patient has a college education.   Patient is right-handed.          Review of Systems  Constitutional: Positive for fatigue. Negative for fever, chills, diaphoresis and appetite change.  Eyes: Negative for visual disturbance.  Respiratory: Negative for cough and shortness of breath.   Cardiovascular: Negative for chest pain, palpitations and leg swelling.  Endocrine: Positive for cold intolerance.  Genitourinary: Negative for decreased urine volume.  Neurological: Negative for syncope, weakness, numbness and headaches.  Hematological: Does not bruise/bleed easily.      BP 152/80  Pulse 77  Temp(Src) 98.3 F (36.8 C)  (Oral)  Resp 16  Ht 5\' 4"  (1.626 m)  Wt 120 lb 3.2 oz (54.522 kg)  BMI 20.62 kg/m2  SpO2 100% Objective:   Physical Exam  Constitutional: She is oriented to person, place, and time. She appears well-developed and well-nourished. No distress.  HENT:  Head: Normocephalic and atraumatic.  Right Ear: External ear normal.  Left Ear: External ear normal.  Eyes: Conjunctivae are normal. No scleral icterus.  Neck: Normal range of motion. Neck supple. No thyromegaly present.  Cardiovascular: Normal rate, regular rhythm, normal heart sounds and intact distal pulses.   Pulmonary/Chest: Effort normal and breath sounds normal. No respiratory distress.  Musculoskeletal: She exhibits no edema.  Lymphadenopathy:    She has no cervical adenopathy.  Neurological: She is alert and oriented to person, place, and time.  Skin: Skin is warm and dry. She is not diaphoretic. No erythema.  Psychiatric: She has a normal mood and affect. Her behavior is normal.      Assessment & Plan:  Need for prophylactic vaccination and inoculation against influenza - Plan: Flu Vaccine QUAD 36+ mos IM  Elevated blood pressure - Plan: Ambulatory referral to Cardiology - check BP outside of the office, record, and bring to f/u.  Snoring disorder - Plan: Ambulatory referral to Sleep Studies  Other malaise and fatigue - Plan: Ambulatory referral to Sleep Studies, TSH, CBC with Differential  Type II or unspecified type diabetes mellitus without mention of complication, not stated as uncontrolled - Plan: Ambulatory referral to Ophthalmology, POCT glycosylated hemoglobin (Hb A1C), Comprehensive metabolic panel, Microalbumin/Creatinine Ratio, Urine, Ambulatory referral to CardiologyDecrease metformin from 500  - bid to qd as a1c fantastic at 5.4. Recheck in 6 mos and consider stopping. Foot exam today, rfer to optho, retry asa 81mg .  Hyperlipidemia LDL goal < 100 - Plan: Lipid panel, Ambulatory referral to Cardiology - Refill  pravastin after lipid panel returns.  Tobacco user - Plan: Vit D  25 hydroxy (rtn osteoporosis monitoring), Ambulatory referral to Cardiology  Disorder of bone and cartilage  - Plan: Vit D  25 hydroxy (rtn osteoporosis monitoring)  De Quervain's disease (radial styloid tenosynovitis)  Need for shingles vaccine - Plan: zoster vaccine live, PF, (ZOSTAVAX) 16109 UNT/0.65ML injection  Osteopenia - Plan: DG Bone Density - last DEXA was 01/10/2009 despite order in chart in Jan 2013. D[facility] from [facility] was put to scanned in to Epic but Rt hip T score was -2.4 so borderline osteoporosis  HM - recommend pt make appt for wellness visit at her f/u in 6 mos.  Over 40 minutes spent in face to face evaluation of patient. Additional time spent in coordinating pt care.  Meds ordered this encounter  Medications  .  zoster vaccine live, PF, (ZOSTAVAX) 21308 UNT/0.65ML injection    Sig: Inject 19,400 Units into the skin once.    Dispense:  1 each    Refill:  0  . metFORMIN (GLUCOPHAGE) 500 MG tablet    Sig: Take 1 tablet (500 mg total) by mouth daily with breakfast.    Dispense:  90 tablet    Refill:  3    Norberto Sorenson, MD MPH

## 2013-04-09 LAB — VITAMIN D 25 HYDROXY (VIT D DEFICIENCY, FRACTURES): Vit D, 25-Hydroxy: 65 ng/mL (ref 30–89)

## 2013-05-11 ENCOUNTER — Ambulatory Visit (INDEPENDENT_AMBULATORY_CARE_PROVIDER_SITE_OTHER): Payer: Medicare Other | Admitting: Internal Medicine

## 2013-05-11 ENCOUNTER — Encounter: Payer: Self-pay | Admitting: Internal Medicine

## 2013-05-11 VITALS — BP 152/96 | HR 80 | Ht 65.0 in | Wt 119.2 lb

## 2013-05-11 DIAGNOSIS — E119 Type 2 diabetes mellitus without complications: Secondary | ICD-10-CM

## 2013-05-11 DIAGNOSIS — I10 Essential (primary) hypertension: Secondary | ICD-10-CM

## 2013-05-11 DIAGNOSIS — R0602 Shortness of breath: Secondary | ICD-10-CM

## 2013-05-11 DIAGNOSIS — E785 Hyperlipidemia, unspecified: Secondary | ICD-10-CM

## 2013-05-11 DIAGNOSIS — Z139 Encounter for screening, unspecified: Secondary | ICD-10-CM | POA: Diagnosis not present

## 2013-05-11 NOTE — Patient Instructions (Signed)
Dr. Debara Pickett has ordered an exercise stress test. This test is done in our office.   Please schedule a follow up appointment in a few weeks, after your test.

## 2013-05-11 NOTE — Progress Notes (Signed)
OFFICE NOTE  Chief Complaint:  Hypertension  Primary Care Physician: Delman Cheadle, MD  HPI:  Terry Armstrong is a pleasant 68 year old female with a history of diabetes, dyslipidemia and a family history of heart disease. She's recently been having some elevated blood pressures and was referred for evaluation of hypertension and management questioning in the office recently also revealed that she snores loudly and may be stopping to breathe. She has been referred to a sleep study with [facility]. Her family history is significant for a sister who has heart disease at age 7 and has had AICD placed.  Both of her parents unfortunately died in separate car accidents. She does report some activity including exercise which does not cause her significant shortness of breath or chest discomfort.  However she is interested in more significant exercise and is concerned that her elevation in blood pressure may be due to cardiac ischemia.  PMHx:  Past Medical History  Diagnosis Date  . Diabetes mellitus without complication   . Breast abscess   . Hyperlipidemia     Past Surgical History  Procedure Laterality Date  . Abdominal hysterectomy  1993    FAMHx:  Family History  Problem Relation Age of Onset  . Diabetes    . Hypertension Sister   . Hyperlipidemia    . Heart disease Sister     defibrillator  . Kidney failure Maternal Aunt     dialysis  . Hypertension Maternal Aunt   . Heart Problems Maternal Grandmother   . Cancer Maternal Grandmother     SOCHx:   reports that she has been smoking Cigarettes.  She has a 28 pack-year smoking history. She has never used smokeless tobacco. She reports that she does not drink alcohol or use illicit drugs.  ALLERGIES:  Allergies  Allergen Reactions  . Aspirin     Swelling, scratching in throat    ROS: A comprehensive review of systems was negative.  HOME MEDS: Current Outpatient Prescriptions  Medication Sig Dispense Refill  .  metFORMIN (GLUCOPHAGE) 500 MG tablet Take 500 mg by mouth 2 (two) times daily with a meal.      . multivitamin-iron-minerals-folic acid (CENTRUM) chewable tablet Chew 1 tablet by mouth daily.      . Omega-3 Fatty Acids (FISH OIL) 1200 MG CAPS Take 1 capsule by mouth daily.      . polyethylene glycol powder (GLYCOLAX/MIRALAX) powder Take 17 g by mouth 2 (two) times daily as needed.  527 g  5  . pravastatin (PRAVACHOL) 40 MG tablet Take 1 tablet (40 mg total) by mouth daily. PATIENT NEEDS OFFICE VISIT FOR ADDITIONAL REFILLS  30 tablet  0  . zoster vaccine live, PF, (ZOSTAVAX) 23762 UNT/0.65ML injection Inject 19,400 Units into the skin once.  1 each  0   No current facility-administered medications for this visit.    LABS/IMAGING: No results found for this or any previous visit (from the past 48 hour(s)). No results found.  VITALS: BP 152/96  Pulse 80  Ht 5\' 5"  (1.651 m)  Wt 119 lb 3.2 oz (54.069 kg)  BMI 19.84 kg/m2  EXAM: General appearance: alert and no distress Neck: no carotid bruit and no JVD Lungs: clear to auscultation bilaterally Heart: regular rate and rhythm, S1, S2 normal, no murmur, click, rub or gallop Abdomen: soft, non-tender; bowel sounds normal; no masses,  no organomegaly Extremities: extremities normal, atraumatic, no cyanosis or edema Pulses: 2+ and symmetric Skin: Skin color, texture, turgor normal. No rashes or  lesions Neurologic: Grossly normal Psych: Mood, affect normal  EKG: Normal sinus rhythm at 80  ASSESSMENT: 1. Uncontrolled hypertension 2. Diabetes type 2 3. Dyslipidemia 4. Possible obstructive sleep apnea 5. Strong family history of coronary disease  PLAN: 1.   Based on Ms. Gibson's strong family history of coronary disease and other cardiac risk factors as well as new onset hypertension which is unusual for her, I would like for her to undergo exercise stress testing.  If this is negative, she could consider starting a more rigorous exercise  program to work on her blood pressure. At this point I'll wait on starting her on blood pressure medications. I did give her a card so that she can keep track of her blood pressures and bring a list of them to me at her next visit. If the blood pressure continues to be elevated, I will start her on blood pressure medication.  Pixie Casino, MD, [facility] Attending Cardiologist [facility]  HILTY,Kenneth C 05/11/2013, 7:14 PM

## 2013-05-12 DIAGNOSIS — M949 Disorder of cartilage, unspecified: Secondary | ICD-10-CM | POA: Diagnosis not present

## 2013-05-12 DIAGNOSIS — M899 Disorder of bone, unspecified: Secondary | ICD-10-CM | POA: Diagnosis not present

## 2013-05-12 LAB — HM DEXA SCAN

## 2013-05-18 ENCOUNTER — Encounter (HOSPITAL_COMMUNITY): Payer: PRIVATE HEALTH INSURANCE

## 2013-05-23 ENCOUNTER — Ambulatory Visit (INDEPENDENT_AMBULATORY_CARE_PROVIDER_SITE_OTHER): Payer: Medicare Other | Admitting: Neurology

## 2013-05-23 ENCOUNTER — Encounter: Payer: Self-pay | Admitting: Neurology

## 2013-05-23 ENCOUNTER — Encounter (INDEPENDENT_AMBULATORY_CARE_PROVIDER_SITE_OTHER): Payer: Self-pay

## 2013-05-23 VITALS — BP 147/76 | HR 74 | Resp 16 | Ht 64.25 in | Wt 119.5 lb

## 2013-05-23 DIAGNOSIS — R0609 Other forms of dyspnea: Secondary | ICD-10-CM | POA: Diagnosis not present

## 2013-05-23 DIAGNOSIS — G478 Other sleep disorders: Secondary | ICD-10-CM | POA: Diagnosis not present

## 2013-05-23 DIAGNOSIS — R0989 Other specified symptoms and signs involving the circulatory and respiratory systems: Secondary | ICD-10-CM

## 2013-05-23 DIAGNOSIS — R0683 Snoring: Secondary | ICD-10-CM | POA: Insufficient documentation

## 2013-05-23 DIAGNOSIS — F17200 Nicotine dependence, unspecified, uncomplicated: Secondary | ICD-10-CM | POA: Diagnosis not present

## 2013-05-23 NOTE — Progress Notes (Signed)
[facility]  Provider:  Larey Seat, M D  Referring Provider: Shawnee Knapp, MD Primary Care Physician:  Delman Cheadle, MD  Chief Complaint  Patient presents with  . New Evaluation    Room 11  . sleep consult- snoring disorder    HPI:  Terry Armstrong is a 68 y.o. female  Is seen here as a referral/ revisit  from Dr. Delman Cheadle for a sleep consultation.  Mrs. Terry Armstrong had recently seen her primary care physician as well as her cardiologist Dr. Debara Pickett, she has recently been having some elevated blood pressures and she was referred for further evaluation she also acknowledges that she's more likely. Dr. Annice Needy therefore referred her to a sleep study. The patient is an active smoker which for the makes the evaluation more reasonable as she has occasional shortness of breath and has not 28 pack years and is at a higher risk of developing emphysema or COPD. She also uses metformin for control of diabetes which is another risk factor. The patient is slender but she has a strong family history of coronary disease. Dr. Debara Pickett decided to wait until she would start on pressure medicines - here in  today's visit again it was 782NF Hg  Systolic.  Mrs. Terry Armstrong reports that over the holidays she had lots of company on proceed also was part of the season. 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 She had noticed that she slept poorly and that her sleep is nonrestorative. She does not believe that her snoring necessarily increased. She rarely has nocturia usually she would only goal once were not at all. Her bedtime is about 11:30 PM, usually she is asleep by midnight. She likes to watch the TV minerals before she goes to bed. She will wake up spontaneously between 6:30 and 7:00 personal alarm needed. To all roll her nocturnal sleep time equals 6-1/2 hours.  But she doesn't feel necessarily restored in the morning. She does normally not take her breakfast, drinkable cups of coffee in the morning, her morning medication she will take sometimes with a slice of tools to have something in the stomach Bufferin. But she reports that sometimes she will take her medication not until p.m. She often doesn't eat until 4 PM at all. The dinner at home traditionally as of 4 coarse meal that she cooks. The liver dinner is her largest meal of the day. She eats around 5:30 PM. There is no further intake of caffeine negative beverages she does not like structured, iced tea , sodas. Is not a regular exerciser but she gardens as a hobby.   [employer] has not reported any restlessness and E./N. yelling sleep talking sleep walking. This is to be no acute change in her sleep pattern and her snoring has been present in spite of her low body mass index for many years.  The patient today and was geriatric depression score at 2 points the fatigue severity score at 17 points in the Epworth sleepiness score at one point on I was able to review his laboratory results from Dr. Raul Del and Dr. Lysbeth Penner office HbA1c is 5.4 her TSH is slightly below normal her vitamin D level was 65 mammogram in middle range,  her CBC was normal and her lipid panel showed an elevated total cholesterol of 214 mg.  Her metabolic panel was entirely normal as was her urine test.  That she needs to stop smoking but has not found a way to make it - She thinks about joining perhaps a group , or some specific classes to get support.   Review of Systems: Out of a complete 14 system review, the patient complains of only the following symptoms, and all other reviewed systems are negative. Weight  loss, fatigue,  nonrestorative sleep,  History   Social History  . Marital Status: Single    Spouse Name: N/A    Number of Children: 0  . Years of Education: 14   Occupational History  .     Social History Main  Topics  . Smoking status: Current Every Day Smoker -- 1.00 packs/day for 28 years    Types: Cigarettes  . Smokeless tobacco: Never Used  . Alcohol Use: Yes     Comment: social  . Drug Use: No  . Sexual Activity: Not on file   Other Topics Concern  . Not on file   Social History Narrative   Patient lives with her significant other.   Patient is retired.   Patient drinks 2-3 cups of caffeine daily in the winter.   Patient has a college education.   Patient is right-handed.          Family History  Problem Relation Age of Onset  . Diabetes    . Hypertension Sister   . Hyperlipidemia    . Heart disease Sister     defibrillator  . Kidney failure Maternal Aunt     dialysis  . Hypertension Maternal Aunt   . Heart Problems Maternal Grandmother   . Cancer Maternal Grandmother     Past Medical History  Diagnosis Date  . Diabetes mellitus without complication   . Breast abscess   . Hyperlipidemia   . Primary snoring     Past Surgical History  Procedure Laterality Date  . Abdominal hysterectomy  1993    Current Outpatient Prescriptions  Medication Sig Dispense Refill  . metFORMIN (GLUCOPHAGE) 500 MG tablet Take 500 mg by mouth 2 (two) times daily with a meal.      . multivitamin-iron-minerals-folic acid (CENTRUM) chewable tablet Chew 1 tablet by mouth daily.      . Omega-3 Fatty Acids (FISH OIL) 1200 MG CAPS Take 1 capsule by mouth daily.      . polyethylene glycol powder (GLYCOLAX/MIRALAX) powder Take 17 g by mouth 2 (two) times daily as needed.  527 g  5  . pravastatin (PRAVACHOL) 40 MG tablet Take 1 tablet (40 mg total) by mouth daily. PATIENT NEEDS OFFICE VISIT FOR ADDITIONAL REFILLS  30 tablet  0  . zoster vaccine live, PF, (ZOSTAVAX) 91478 UNT/0.65ML injection Inject 19,400 Units into the skin once.  1 each  0   No current facility-administered medications for this visit.    Allergies as of 05/23/2013 - Review Complete 05/23/2013  Allergen Reaction Noted  .  Aspirin  12/04/2008    Vitals: BP 147/76  Pulse 74  Resp 16  Ht 5' 4.25" (1.632 m)  Wt 119 lb 8 oz (54.205 kg)  BMI 20.35 kg/m2 Last Weight:  Wt Readings from Last 1 Encounters:  05/23/13 119 lb 8 oz (54.205 kg)   Last Height:   Ht Readings from Last 1 Encounters:  05/23/13 5' 4.25" (1.632 m)    Physical exam:  General: The patient is awake, alert and appears not in acute distress. The patient is  well groomed. Head: Normocephalic, atraumatic. Neck is supple. Mallampati 4 , neck circumference:13 - suprisingly large tongue.  Cardiovascular:  Regular rate and rhythm , without  murmurs or carotid bruit, and without distended neck veins. Respiratory: Lungs are clear to auscultation. Skin:  Without evidence of edema, or rash Trunk:  Very slender . Neurologic exam : The patient is awake and alert, oriented to place and time.  Memory subjective described as intact. There is a normal attention span & concentration ability.  Speech is fluent without  dysarthria, dysphonia or aphasia. Mood and affect are appropriate.  Cranial nerves: Pupils are equal and briskly reactive to light. Funduscopic exam without  evidence of pallor or edema. Extraocular movements  in vertical and horizontal planes intact and without nystagmus. Visual fields by finger perimetry are intact. Hearing to finger rub intact.  Facial sensation intact to fine touch. Facial motor strength is symmetric and tongue and uvula move midline.  Motor exam:   Normal tone and normal muscle bulk and symmetric normal strength in all extremities.  Sensory:  Fine touch, pinprick and vibration were tested in all extremities. Proprioception  normal.  Coordination: Rapid alternating movements in the fingers/hands is tested and normal. Finger-to-nose maneuver tested and normal without evidence of ataxia, dysmetria or tremor.  Gait and station: Patient walks without assistive device . Deep tendon reflexes: in the  upper and lower  extremities are symmetric and intact, rather brisk , but without clonus. Babinski maneuver response is down going on the left, equivocal on the right .       Assessment:  After physical and neurologic examination, review of laboratory studies, imaging, neurophysiology testing and pre-existing records, assessment is   Transient non restorative sleep, new onset hypertension, weight loss and TSH is reduced- she is hyperreflexic . Her right Babinski is equivocal .Plan:  Treatment plan and additional workup :  Will evaluate for oxygen levels at night, PSG with co2 measures. I referred her for a smoking cessation class to [place].   TSH to be reviewed by PCP- weight loss was not planned, she reports.

## 2013-05-23 NOTE — Patient Instructions (Signed)
You Can Quit Smoking If you are ready to quit smoking or are thinking about it, congratulations! You have chosen to help yourself be healthier and live longer! There are lots of different ways to quit smoking. Nicotine gum, nicotine patches, a nicotine inhaler, or nicotine nasal spray can help with physical craving. Hypnosis, support groups, and medicines help break the habit of smoking. TIPS TO GET OFF AND STAY OFF CIGARETTES  Learn to predict your moods. Do not let a bad situation be your excuse to have a cigarette. Some situations in your life might tempt you to have a cigarette.  Ask friends and co-workers not to smoke around you.  Make your home smoke-free.  Never have "just one" cigarette. It leads to wanting another and another. Remind yourself of your decision to quit.  On a card, make a list of your reasons for not smoking. Read it at least the same number of times a day as you have a cigarette. Tell yourself everyday, "I do not want to smoke. I choose not to smoke."  Ask someone at home or work to help you with your plan to quit smoking.  Have something planned after you eat or have a cup of coffee. Take a walk or get other exercise to perk you up. This will help to keep you from overeating.  Try a relaxation exercise to calm you down and decrease your stress. Remember, you may be tense and nervous the first two weeks after you quit. This will pass.  Find new activities to keep your hands busy. Play with a pen, coin, or rubber band. Doodle or draw things on paper.  Brush your teeth right after eating. This will help cut down the craving for the taste of tobacco after meals. You can try mouthwash too.  Try gum, breath mints, or diet candy to keep something in your mouth. IF YOU SMOKE AND WANT TO QUIT:  Do not stock up on cigarettes. Never buy a carton. Wait until one pack is finished before you buy another.  Never carry cigarettes with you at work or at home.  Keep cigarettes  as far away from you as possible. Leave them with someone else.  Never carry matches or a lighter with you.  Ask yourself, "Do I need this cigarette or is this just a reflex?"  Bet with someone that you can quit. Put cigarette money in a piggy bank every morning. If you smoke, you give up the money. If you do not smoke, by the end of the week, you keep the money.  Keep trying. It takes 21 days to change a habit!  Talk to your doctor about using medicines to help you quit. These include nicotine replacement gum, lozenges, or skin patches. Document Released: 02/15/2009 Document Revised: 07/14/2011 Document Reviewed: 02/15/2009 [employer] Patient Information 2014 [employer].  

## 2013-05-24 ENCOUNTER — Ambulatory Visit: Payer: PRIVATE HEALTH INSURANCE | Admitting: Internal Medicine

## 2013-06-01 ENCOUNTER — Ambulatory Visit (HOSPITAL_COMMUNITY)
Admission: RE | Admit: 2013-06-01 | Discharge: 2013-06-01 | Disposition: A | Discharge status: Home / Self Care | Payer: Medicare Other | Source: Ambulatory Visit | Attending: Internal Medicine | Admitting: Internal Medicine

## 2013-06-01 DIAGNOSIS — I10 Essential (primary) hypertension: Secondary | ICD-10-CM | POA: Diagnosis not present

## 2013-06-01 DIAGNOSIS — R0602 Shortness of breath: Secondary | ICD-10-CM

## 2013-06-01 DIAGNOSIS — Z139 Encounter for screening, unspecified: Secondary | ICD-10-CM

## 2013-06-02 ENCOUNTER — Encounter: Payer: Self-pay | Admitting: Family Medicine

## 2013-06-02 MED ORDER — ATORVASTATIN CALCIUM 40 MG PO TABS: 40.0000 mg | ORAL_TABLET | Freq: Every day | ORAL | Status: DC

## 2013-06-03 ENCOUNTER — Telehealth: Payer: Self-pay | Admitting: Family Medicine

## 2013-06-03 MED ORDER — ALENDRONATE SODIUM 70 MG PO TABS: 70.0000 mg | ORAL_TABLET | ORAL | Status: DC

## 2013-06-03 NOTE — Telephone Encounter (Signed)
Lm for rtn call 

## 2013-06-03 NOTE — Telephone Encounter (Signed)
Pt DEXA scan came back showing new-onset osteoporosis.  Needs to start bisphosphonate therapy - fosamax was sent to pharmacy - take once per week on an empty stomach for at least an hr and sit upright during this time.  Make sure she is taking calcium total of 1200mg  daily (at least in 2 divided doses) and vitamin D and getting weight bearing exercise. We will need to recheck DEXA scan in 2 yrs.

## 2013-06-05 NOTE — Telephone Encounter (Signed)
Pt notified. A little upset that we didn't call her about her labs and that we changed her to lipitor from pravastatin. Wants to call tomorrow and make an appt with you this week to sit and discuss her labs and DEXA.

## 2013-06-06 NOTE — Telephone Encounter (Signed)
Pt was sent letter in the mail that states below and included her lab results:  The test results show that your salts in your blood, kidneys, liver, blood counts, and vitamin D levels are perfect.    However, your thyroid is a hair below normal which could mean that it may be overactive.  Please make an appointment or come in to urgent care to have additional thyroid testing done.    Your cholesterol level is still much to high so we will change your pravastatin to atorvastatin - a stronger cholesterol medication which has been sent to your pharmacy.    At your next visit, we will want to recheck your liver to ensure you are tolerating your atorvastatin but you do not need to be fasting until your follow-up visit this summer.  She is welcome to make an appt w/ Dr. Marin Comment or Dr. Leward Quan as needed.

## 2013-06-06 NOTE — Telephone Encounter (Signed)
Patient is wanting to stay on Pravastatin instead of Lipitor.    Best#: [phone]

## 2013-06-06 NOTE — Telephone Encounter (Signed)
Unfortunately Dr. Brigitte Pulse is out of the office on maternity leave.  However we would be happy to discuss results with her at an appt with another provider

## 2013-06-06 NOTE — Telephone Encounter (Signed)
Made appointment with Dr Marin Comment on 2/27 @ 8am to follow up of bloodwork.

## 2013-06-06 NOTE — Telephone Encounter (Signed)
Message copied by Gonzella Lex on Mon Jun 06, 2013  2:20 PM ------      Message from: Brigitte Pulse, EVA      Created: Thu Jun 02, 2013 12:54 PM       please make OV for pt to come in for repeat lab testing sometime in the next month.  Her thyroid was just borderline low soneeds to be rechecked along with some additional studies.  Also, her cholesterol from pravastatin to atorvastatin which is stronger so when she gets her blood rechecked for thyroid tests she should also have her liver rechecked.  Does NOT need to be fasting.      Dr. Brigitte Pulse ------

## 2013-06-07 ENCOUNTER — Encounter: Payer: Self-pay | Admitting: Internal Medicine

## 2013-06-07 ENCOUNTER — Ambulatory Visit (INDEPENDENT_AMBULATORY_CARE_PROVIDER_SITE_OTHER): Payer: Medicare Other | Admitting: Internal Medicine

## 2013-06-07 VITALS — BP 130/86 | HR 76 | Ht 65.0 in | Wt 121.1 lb

## 2013-06-07 DIAGNOSIS — E785 Hyperlipidemia, unspecified: Secondary | ICD-10-CM | POA: Diagnosis not present

## 2013-06-07 DIAGNOSIS — I10 Essential (primary) hypertension: Secondary | ICD-10-CM

## 2013-06-07 DIAGNOSIS — E119 Type 2 diabetes mellitus without complications: Secondary | ICD-10-CM | POA: Diagnosis not present

## 2013-06-07 NOTE — Progress Notes (Signed)
OFFICE NOTE  Chief Complaint:  Hypertension  Primary Care Physician: Terry Cheadle, MD  HPI:  Terry Armstrong is a pleasant 68 year old female with a history of diabetes, dyslipidemia and a family history of heart disease. She's recently been having some elevated blood pressures and was referred for evaluation of hypertension and management questioning in the office recently also revealed that she snores loudly and may be stopping to breathe. She has been referred to a sleep study with [facility]. Her family history is significant for a sister who has heart disease at age 80 and has had AICD placed.  Both of her parents unfortunately died in separate car accidents. She does report some activity including exercise which does not cause her significant shortness of breath or chest discomfort.  However she is interested in more significant exercise and is concerned that her elevation in blood pressure may be due to cardiac ischemia.  Terry Armstrong returns today for followup of her exercise treadmill stress test. She did very well and exercised for over 7 minutes 11 metabolic equivalents. There is no evidence for ischemia. She did have a hypertensive response to exercise with systolic blood pressure of 200.  Her blood pressure however quickly recovered back to baseline. I did ask her to take blood pressure readings at home and she brought him about 15 readings today. This demonstrates blood pressures between 875 systolic up to 643/32-95. Overall this is generally good control and indicates there is probably not a need for blood pressure medication.  PMHx:  Past Medical History  Diagnosis Date  . Diabetes mellitus without complication   . Breast abscess   . Hyperlipidemia   . Primary snoring     Past Surgical History  Procedure Laterality Date  . Abdominal hysterectomy  1993    FAMHx:  Family History  Problem Relation Age of Onset  . Diabetes    . Hypertension Sister   .  Hyperlipidemia    . Heart disease Sister     defibrillator  . Kidney failure Maternal Aunt     dialysis  . Hypertension Maternal Aunt   . Heart Problems Maternal Grandmother   . Cancer Maternal Grandmother     SOCHx:   reports that she has been smoking Cigarettes.  She has a 28 pack-year smoking history. She has never used smokeless tobacco. She reports that she drinks alcohol. She reports that she does not use illicit drugs.  ALLERGIES:  Allergies  Allergen Reactions  . Aspirin     Swelling, scratching in throat    ROS: A comprehensive review of systems was negative.  HOME MEDS: Current Outpatient Prescriptions  Medication Sig Dispense Refill  . alendronate (FOSAMAX) 70 MG tablet Take 1 tablet (70 mg total) by mouth once a week. Take with a full glass of water on an empty stomach.  12 tablet  3  . metFORMIN (GLUCOPHAGE) 500 MG tablet Take 500 mg by mouth 2 (two) times daily with a meal.      . multivitamin-iron-minerals-folic acid (CENTRUM) chewable tablet Chew 1 tablet by mouth daily.      . Omega-3 Fatty Acids (FISH OIL) 1200 MG CAPS Take 1 capsule by mouth daily.      . polyethylene glycol powder (GLYCOLAX/MIRALAX) powder Take 17 g by mouth 2 (two) times daily as needed.  527 g  5  . PRAVASTATIN SODIUM PO Take by mouth daily.      Marland Kitchen zoster vaccine live, PF, (ZOSTAVAX) 18841 UNT/0.65ML injection Inject 19,400  Units into the skin once.  1 each  0   No current facility-administered medications for this visit.    LABS/IMAGING: No results found for this or any previous visit (from the past 48 hour(s)). No results found.  VITALS: BP 130/86  Pulse 76  Ht 5\' 5"  (1.651 m)  Wt 121 lb 1.6 oz (54.931 kg)  BMI 20.15 kg/m2  EXAM: deferred  EKG: deferred  ASSESSMENT: 1. Diabetes type 2 2. Dyslipidemia 3. Possible obstructive sleep apnea 4. Strong family history of coronary disease  PLAN: 1.   Ms. Terry Armstrong had a low-risk treadmill stress test. She is asymptomatic at  this time. She does have risk factors for coronary disease and should be watched closely. Her blood pressure is well controlled and I do not feel that she needs medication for this at this time. She was referred to Dr. Brett Armstrong at [facility] for a sleep study. I believe this is still pending. It would be interesting to see if she has sleep apnea. Her hypertension may be situational or perhaps "white coat".  I advised that she continue to periodically monitor her blood pressure and increase her exercise which should help if she has an element of small vessel ischemia which could have caused diastolic dysfunction and the increase in her blood pressure during exercise.   I'll plan to see her back annually or sooner as necessary.  Terry Casino, MD, [facility] Attending Cardiologist [facility]  Terry Armstrong 06/07/2013, 4:19 PM

## 2013-06-07 NOTE — Patient Instructions (Signed)
Your physician wants you to follow-up in: 1 year. You will receive a reminder letter in the mail two months in advance. If you don't receive a letter, please call our office to schedule the follow-up appointment.  

## 2013-06-14 ENCOUNTER — Ambulatory Visit (INDEPENDENT_AMBULATORY_CARE_PROVIDER_SITE_OTHER): Payer: Medicare Other

## 2013-06-14 DIAGNOSIS — F17200 Nicotine dependence, unspecified, uncomplicated: Secondary | ICD-10-CM

## 2013-06-14 DIAGNOSIS — G4733 Obstructive sleep apnea (adult) (pediatric): Secondary | ICD-10-CM

## 2013-06-14 DIAGNOSIS — G478 Other sleep disorders: Secondary | ICD-10-CM

## 2013-06-14 DIAGNOSIS — R0683 Snoring: Secondary | ICD-10-CM

## 2013-06-15 ENCOUNTER — Other Ambulatory Visit: Payer: Self-pay | Admitting: Family Medicine

## 2013-06-24 ENCOUNTER — Telehealth: Payer: Self-pay | Admitting: Neurology

## 2013-06-24 ENCOUNTER — Encounter: Payer: Self-pay | Admitting: *Deleted

## 2013-06-24 NOTE — Telephone Encounter (Signed)
I called and left a message for the patient about her recent sleep study results. I informed the patient that the study revealed periodic leg movement arousals which affect patients sleep quality, evidence of Upper airway resistance syndrome with a respiratory disturbance index of 13.9 and mild obstructive sleep apnea. Dr. Brett Fairy recommends she schedule a revisit, so that she may discuss treatment options. I will mail a copy of the report to Dr. Wilhemina Bonito office and I will mail the report to the patient.

## 2013-07-01 ENCOUNTER — Ambulatory Visit: Payer: Medicare Other | Admitting: Family Medicine

## 2013-07-01 ENCOUNTER — Encounter: Payer: Medicare Other | Admitting: Family Medicine

## 2013-07-01 MED ORDER — PRAVASTATIN SODIUM 40 MG PO TABS: 40.0000 mg | ORAL_TABLET | Freq: Every day | ORAL | Status: DC

## 2013-07-08 ENCOUNTER — Encounter: Payer: Self-pay | Admitting: Family Medicine

## 2013-07-08 ENCOUNTER — Ambulatory Visit (INDEPENDENT_AMBULATORY_CARE_PROVIDER_SITE_OTHER): Payer: Medicare Other | Admitting: Family Medicine

## 2013-07-08 VITALS — BP 132/70 | HR 75 | Temp 97.9°F | Resp 16 | Ht 64.5 in | Wt 120.0 lb

## 2013-07-08 DIAGNOSIS — Z23 Encounter for immunization: Secondary | ICD-10-CM

## 2013-07-08 DIAGNOSIS — E119 Type 2 diabetes mellitus without complications: Secondary | ICD-10-CM

## 2013-07-08 DIAGNOSIS — E785 Hyperlipidemia, unspecified: Secondary | ICD-10-CM

## 2013-07-08 DIAGNOSIS — R946 Abnormal results of thyroid function studies: Secondary | ICD-10-CM

## 2013-07-08 DIAGNOSIS — R7989 Other specified abnormal findings of blood chemistry: Secondary | ICD-10-CM

## 2013-07-08 LAB — COMPREHENSIVE METABOLIC PANEL WITH GFR
ALT: 13 U/L (ref 0–35)
CO2: 27 meq/L (ref 19–32)
Chloride: 105 meq/L (ref 96–112)
Sodium: 140 meq/L (ref 135–145)
Total Bilirubin: 0.5 mg/dL (ref 0.2–1.2)
Total Protein: 6.6 g/dL (ref 6.0–8.3)

## 2013-07-08 LAB — COMPREHENSIVE METABOLIC PANEL
AST: 15 U/L (ref 0–37)
Albumin: 4.1 g/dL (ref 3.5–5.2)
Alkaline Phosphatase: 59 U/L (ref 39–117)
BUN: 12 mg/dL (ref 6–23)
Calcium: 9.5 mg/dL (ref 8.4–10.5)
Creat: 0.63 mg/dL (ref 0.50–1.10)
Glucose, Bld: 92 mg/dL (ref 70–99)
Potassium: 4.6 mEq/L (ref 3.5–5.3)

## 2013-07-08 LAB — TSH: TSH: 0.386 u[IU]/mL (ref 0.350–4.500)

## 2013-07-08 LAB — POCT GLYCOSYLATED HEMOGLOBIN (HGB A1C): Hemoglobin A1C: 5.5

## 2013-07-08 MED ORDER — PRAVASTATIN SODIUM 40 MG PO TABS: 40.0000 mg | ORAL_TABLET | Freq: Every day | ORAL | Status: DC

## 2013-07-08 MED ORDER — ZOSTER VACCINE LIVE 19400 UNT/0.65ML ~~LOC~~ SOLR: 0.6500 mL | Freq: Once | SUBCUTANEOUS | Status: DC

## 2013-07-08 MED ORDER — METFORMIN HCL 500 MG PO TABS: 500.0000 mg | ORAL_TABLET | Freq: Two times a day (BID) | ORAL | Status: DC

## 2013-07-08 NOTE — Progress Notes (Signed)
Chief Complaint:  Chief Complaint  Patient presents with  . Medication Refill    Pravastatin 40     HPI: Terry Armstrong is a 68 y.o. female who is here for  Medication refills.  She does not know if she needs metformin but she needs refills on her pravastatin She went to see Dr Brett Fairy. She had an abnormal sleep study, has not bee to d/w Dr Brett Fairy results: see note below from her office:  Study revealed periodic leg movement arousals which affect patients sleep quality, evidence of Upper airway resistance syndrome with a respiratory disturbance index of 13.9 and mild obstructive sleep apnea. Dr. Brett Fairy recommends she schedule a revisit, so that she may discuss treatment options.   She also saw cardiologist, Dr Debara Pickett. No BP intervention needed. See A/P from note: Terry Armstrong had a low-risk treadmill stress test. She is asymptomatic at this time. She does have risk factors for coronary disease and should be watched closely. Her blood pressure is well controlled and I do not feel that she needs medication for this at this time. She was referred to Dr. Brett Fairy at [facility] for a sleep study. I believe this is still pending. It would be interesting to see if she has sleep apnea. Her hypertension may be situational or perhaps "white coat". I advised that she continue to periodically monitor her blood pressure and increase her exercise which should help if she has an element of small vessel ischemia which could have caused diastolic dysfunction and the increase in her blood pressure during exercise. I'll plan to see her back annually or sooner as necessary.  She is currently in a high stress situation, step daughter, her boyfriend and their child moved down from [place] and is living with her and her partner  She has been with her partner ( childhood  since 2005, she is doing well with him but the stepdaughter and her family are making it stressfull Taking medications as  prescribed, no SEs No abd pain, n/v/fevers, chills, dizziness, cp  She also needs a reprint of shingles vaccine since she misplaced it   Last OV from Doctor Brigitte Pulse in 04/2013:   Assessment & Plan:   Need for prophylactic vaccination and inoculation against influenza - Plan: Flu Vaccine QUAD 36+ mos IM  Elevated blood pressure - Plan: Ambulatory referral to Cardiology - check BP outside of the office, record, and bring to f/u.  Snoring disorder - Plan: Ambulatory referral to Sleep Studies  Other malaise and fatigue - Plan: Ambulatory referral to Sleep Studies, TSH, CBC with Differential  Type II or unspecified type diabetes mellitus without mention of complication, not stated as uncontrolled - Plan: Ambulatory referral to Ophthalmology, POCT glycosylated hemoglobin (Hb A1C), Comprehensive metabolic panel, Microalbumin/Creatinine Ratio, Urine, Ambulatory referral to CardiologyDecrease metformin from 500 - bid to qd as a1c fantastic at 5.4. Recheck in 6 mos and consider stopping. Foot exam today, rfer to optho, retry asa 77m.  Hyperlipidemia LDL goal < 100 - Plan: Lipid panel, Ambulatory referral to Cardiology - Refill pravastin after lipid panel returns.  Tobacco user - Plan: Vit D 25 hydroxy (rtn osteoporosis monitoring), Ambulatory referral to Cardiology  Disorder of bone and cartilage - Plan: Vit D 25 hydroxy (rtn osteoporosis monitoring)  De Quervain's disease (radial styloid tenosynovitis)  Need for shingles vaccine - Plan: zoster vaccine live, PF, (ZOSTAVAX) 133383UNT/0.65ML injection  Osteopenia - Plan: DG Bone Density - last DEXA was 01/10/2009 despite order in chart  in Jan 2013. DEXA from [facility] was put to scanned in to Epic but Rt hip T score was -2.4 so borderline osteoporosis  HM - recommend pt make appt for wellness visit at her f/u in 6 mos.  Over 40 minutes spent in face to face evaluation of patient. Additional time spent in coordinating pt care.   Past Medical History  Diagnosis  Date  . Diabetes mellitus without complication   . Breast abscess   . Hyperlipidemia   . Primary snoring    Past Surgical History  Procedure Laterality Date  . Abdominal hysterectomy  1993   History   Social History  . Marital Status: Single    Spouse Name: N/A    Number of Children: 0  . Years of Education: 14   Occupational History  .     Social History Main Topics  . Smoking status: Current Every Day Smoker -- 1.00 packs/day for 28 years    Types: Cigarettes  . Smokeless tobacco: Never Used  . Alcohol Use: Yes     Comment: social  . Drug Use: No  . Sexual Activity: Not on file   Other Topics Concern  . Not on file   Social History Narrative   Patient lives with her significant other.   Patient is retired.   Patient drinks 2-3 cups of caffeine daily in the winter.   Patient has a college education.   Patient is right-handed.         Family History  Problem Relation Age of Onset  . Diabetes    . Hypertension Sister   . Hyperlipidemia    . Heart disease Sister     defibrillator  . Kidney failure Maternal Aunt     dialysis  . Hypertension Maternal Aunt   . Heart Problems Maternal Grandmother   . Cancer Maternal Grandmother    Allergies  Allergen Reactions  . Aspirin     Swelling, scratching in throat   Prior to Admission medications   Medication Sig Start Date End Date Taking? Authorizing Provider  alendronate (FOSAMAX) 70 MG tablet Take 1 tablet (70 mg total) by mouth once a week. Take with a full glass of water on an empty stomach. 06/03/13   Shawnee Knapp, MD  metFORMIN (GLUCOPHAGE) 500 MG tablet Take 500 mg by mouth 2 (two) times daily with a meal. 04/08/13   Shawnee Knapp, MD  multivitamin-iron-minerals-folic acid (CENTRUM) chewable tablet Chew 1 tablet by mouth daily.    Historical Provider, MD  Omega-3 Fatty Acids (FISH OIL) 1200 MG CAPS Take 1 capsule by mouth daily.    Historical Provider, MD  polyethylene glycol powder (GLYCOLAX/MIRALAX) powder Take  17 g by mouth 2 (two) times daily as needed. 04/19/12   Shawnee Knapp, MD  pravastatin (PRAVACHOL) 40 MG tablet Take 1 tablet (40 mg total) by mouth daily. PATIENT NEEDS OFFICE VISIT FOR ADDITIONAL REFILLS 07/01/13   Montrez Marietta P Wynn Kernes, DO  PRAVASTATIN SODIUM PO Take by mouth daily.    Historical Provider, MD  zoster vaccine live, PF, (ZOSTAVAX) 07867 UNT/0.65ML injection Inject 19,400 Units into the skin once. 04/08/13   Shawnee Knapp, MD     ROS: The patient denies fevers, chills, night sweats, unintentional weight loss, chest pain, palpitations, wheezing, dyspnea on exertion, nausea, vomiting, abdominal pain, dysuria, hematuria, melena, numbness, weakness, or tingling.   All other systems have been reviewed and were otherwise negative with the exception of those mentioned in the HPI and as above.  PHYSICAL EXAM: Filed Vitals:   07/08/13 0902  BP: 132/70  Pulse: 75  Temp: 97.9 F (36.6 C)  Resp: 16   Filed Vitals:   07/08/13 0902  Height: 5' 4.5" (1.638 m)  Weight: 120 lb (54.432 kg)   Body mass index is 20.29 kg/(m^2).  General: Alert, no acute distress, thin pleasant AA female HEENT:  Normocephalic, atraumatic, oropharynx patent. EOMI, PERRLA, fundo exam grossly nl Cardiovascular:  Regular rate and rhythm, no rubs murmurs or gallops.  No Carotid bruits, radial pulse intact. No pedal edema.  Respiratory: Clear to auscultation bilaterally.  No wheezes, rales, or rhonchi.  No cyanosis, no use of accessory musculature GI: No organomegaly, abdomen is soft and non-tender, positive bowel sounds.  No masses. Skin: No rashes. Neurologic: Facial musculature symmetric. Psychiatric: Patient is appropriate throughout our interaction. Lymphatic: No cervical lymphadenopathy Musculoskeletal: Gait intact. Microfilament exam--negative, good DP and PTA May have right great toe nail fungus but may be nailcolor, will monitor   LABS: Results for orders placed in visit on 04/08/13  VITAMIN D 25 HYDROXY       Result Value Ref Range   Vit D, 25-Hydroxy 65  30 - 89 ng/mL  TSH      Result Value Ref Range   TSH 0.306 (*) 0.350 - 4.500 uIU/mL  CBC WITH DIFFERENTIAL      Result Value Ref Range   WBC 8.3  4.0 - 10.5 K/uL   RBC 4.29  3.87 - 5.11 MIL/uL   Hemoglobin 14.2  12.0 - 15.0 g/dL   HCT 40.4  36.0 - 46.0 %   MCV 94.2  78.0 - 100.0 fL   MCH 33.1  26.0 - 34.0 pg   MCHC 35.1  30.0 - 36.0 g/dL   RDW 14.4  11.5 - 15.5 %   Platelets 320  150 - 400 K/uL   Neutrophils Relative % 60  43 - 77 %   Neutro Abs 5.1  1.7 - 7.7 K/uL   Lymphocytes Relative 30  12 - 46 %   Lymphs Abs 2.5  0.7 - 4.0 K/uL   Monocytes Relative 6  3 - 12 %   Monocytes Absolute 0.5  0.1 - 1.0 K/uL   Eosinophils Relative 3  0 - 5 %   Eosinophils Absolute 0.2  0.0 - 0.7 K/uL   Basophils Relative 1  0 - 1 %   Basophils Absolute 0.1  0.0 - 0.1 K/uL   Smear Review Criteria for review not met    LIPID PANEL      Result Value Ref Range   Cholesterol 214 (*) 0 - 200 mg/dL   Triglycerides 59  <150 mg/dL   HDL 70  >39 mg/dL   Total CHOL/HDL Ratio 3.1     VLDL 12  0 - 40 mg/dL   LDL Cholesterol 132 (*) 0 - 99 mg/dL  COMPREHENSIVE METABOLIC PANEL      Result Value Ref Range   Sodium 140  135 - 145 mEq/L   Potassium 4.2  3.5 - 5.3 mEq/L   Chloride 105  96 - 112 mEq/L   CO2 27  19 - 32 mEq/L   Glucose, Bld 89  70 - 99 mg/dL   BUN 13  6 - 23 mg/dL   Creat 0.61  0.50 - 1.10 mg/dL   Total Bilirubin 0.5  0.3 - 1.2 mg/dL   Alkaline Phosphatase 68  39 - 117 U/L   AST 15  0 - 37 U/L  ALT 15  0 - 35 U/L   Total Protein 7.1  6.0 - 8.3 g/dL   Albumin 4.4  3.5 - 5.2 g/dL   Calcium 9.4  8.4 - 10.5 mg/dL  MICROALBUMIN / CREATININE URINE RATIO      Result Value Ref Range   Microalb, Ur 1.16  0.00 - 1.89 mg/dL   Creatinine, Urine 206.2     Microalb Creat Ratio 5.6  0.0 - 30.0 mg/g  POCT GLYCOSYLATED HEMOGLOBIN (HGB A1C)      Result Value Ref Range   Hemoglobin A1C 5.4       EKG/XRAY:   Primary read interpreted by Dr. Marin Comment at  [facility].   ASSESSMENT/PLAN: Encounter Diagnoses  Name Primary?  . Need for shingles vaccine Yes  . Abnormal thyroid blood test   . Other and unspecified hyperlipidemia   . Type II or unspecified type diabetes mellitus without mention of complication, not stated as uncontrolled    She is doing well Compliant with meds without SEs Has not taken Fosamax will do that soon She will see Dr Carolynn Comment again to d/w her what options she has for her mold OSA and restless leg She will be re -referred to Dr Katy Fitch for a diabetic eye exam. Labs pending, will recheck TSH since slightly low last visit and never came back for recheck She will see Dr Brigitte Pulse in 3 months and let her know her progress   Gross sideeffects, risk and benefits, and alternatives of medications d/w patient. Patient is aware that all medications have potential sideeffects and we are unable to predict every sideeffect or drug-drug interaction that may occur.  Dillard Pascal, [place], DO 07/08/2013 10:02 AM

## 2013-07-14 ENCOUNTER — Encounter: Payer: Self-pay | Admitting: Family Medicine

## 2013-07-20 ENCOUNTER — Encounter: Payer: Self-pay | Admitting: Family Medicine

## 2013-07-21 ENCOUNTER — Encounter: Payer: Self-pay | Admitting: Family Medicine

## 2013-08-18 NOTE — Progress Notes (Signed)
This encounter was created in error - please disregard.

## 2013-08-24 DIAGNOSIS — N61 Mastitis without abscess: Secondary | ICD-10-CM | POA: Diagnosis not present

## 2013-08-30 DIAGNOSIS — E119 Type 2 diabetes mellitus without complications: Secondary | ICD-10-CM | POA: Diagnosis not present

## 2013-08-30 DIAGNOSIS — H2510 Age-related nuclear cataract, unspecified eye: Secondary | ICD-10-CM | POA: Diagnosis not present

## 2013-09-15 ENCOUNTER — Encounter: Payer: Self-pay | Admitting: Family Medicine

## 2013-09-16 ENCOUNTER — Encounter: Payer: Self-pay | Admitting: Family Medicine

## 2013-09-16 ENCOUNTER — Ambulatory Visit (INDEPENDENT_AMBULATORY_CARE_PROVIDER_SITE_OTHER): Payer: Medicare Other | Admitting: Family Medicine

## 2013-09-16 VITALS — BP 130/70 | HR 76 | Temp 98.4°F | Resp 16 | Ht 64.25 in | Wt 120.0 lb

## 2013-09-16 DIAGNOSIS — R3989 Other symptoms and signs involving the genitourinary system: Secondary | ICD-10-CM

## 2013-09-16 DIAGNOSIS — Z23 Encounter for immunization: Secondary | ICD-10-CM

## 2013-09-16 DIAGNOSIS — L748 Other eccrine sweat disorders: Secondary | ICD-10-CM

## 2013-09-16 DIAGNOSIS — N399 Disorder of urinary system, unspecified: Secondary | ICD-10-CM | POA: Diagnosis not present

## 2013-09-16 DIAGNOSIS — L750 Bromhidrosis: Secondary | ICD-10-CM

## 2013-09-16 LAB — POCT URINALYSIS DIPSTICK
Bilirubin, UA: NEGATIVE
Blood, UA: NEGATIVE
Glucose, UA: NEGATIVE
Leukocytes, UA: NEGATIVE
Nitrite, UA: NEGATIVE
PH UA: 7
PROTEIN UA: 30
SPEC GRAV UA: 1.02
UROBILINOGEN UA: 4

## 2013-09-16 LAB — POCT UA - MICROSCOPIC ONLY
CASTS, UR, LPF, POC: NEGATIVE
Crystals, Ur, HPF, POC: NEGATIVE
Epithelial cells, urine per micros: NEGATIVE
Mucus, UA: NEGATIVE
Yeast, UA: NEGATIVE

## 2013-09-16 MED ORDER — ZOSTER VACCINE LIVE 19400 UNT/0.65ML ~~LOC~~ SOLR: 0.6500 mL | Freq: Once | SUBCUTANEOUS | Status: DC

## 2013-09-16 NOTE — Progress Notes (Signed)
Subjective:    Patient ID: Terry Armstrong, female    DOB: 07/06/1945, 68 y.o.   MRN: [id number] Chief Complaint  Patient presents with  . Toe Pain    LEFT X 1 MONTH AND URINARY PROBLEMS WITH ODOR    HPI This chart was scribed for Shawnee Knapp, MD by Ladene Artist, ED Scribe. The patient was seen in room 21. Patient's care was started at 3:38 PM.  HPI Comments: Terry Armstrong is a 68 y.o. female who presents to the [facility] complaining of L great toe pain onset approximately 2 months ago. She reports discoloration to the toenail. Pt does not recall injury to the toe.   Pt also reports urinary urgency and malodor. Pt suspects a UTI that has resolved. She denies vaginal discharge or urinary incontinence. Pt states that she drinks less water than she should and reports fatigue with dehydration. She states that she will try to consume 2-3 bottles of water per day.    Pt is still taking metformin twice daily. She wants to continue the medication due to fear of being taken off of the medication. Pt states that she has returned to being active.   Pt takes Miralx once a week. She denies constipation.   Pt plans to start Fosamax newt week. Pt still takes Fish Oil.   Pt has eaten multiple times today.  Pt went to cardiologist, optometrist with [employer] [facility] and has had a sleep study. Pt did not get the shingles vaccination since she lost the prescription.   Pt states that she loves Spring and is doing a lot of gardening in the garden in her backyard.   Past Medical History  Diagnosis Date  . Diabetes mellitus without complication   . Breast abscess   . Hyperlipidemia   . Primary snoring    Current Outpatient Prescriptions on File Prior to Visit  Medication Sig Dispense Refill  . alendronate (FOSAMAX) 70 MG tablet Take 1 tablet (70 mg total) by mouth once a week. Take with a full glass of water on an empty stomach.  12 tablet  3  . metFORMIN (GLUCOPHAGE) 500 MG tablet  Take 1 tablet (500 mg total) by mouth 2 (two) times daily with a meal.  60 tablet  5  . multivitamin-iron-minerals-folic acid (CENTRUM) chewable tablet Chew 1 tablet by mouth daily.      . Omega-3 Fatty Acids (FISH OIL) 1200 MG CAPS Take 1 capsule by mouth daily.      . polyethylene glycol powder (GLYCOLAX/MIRALAX) powder Take 17 g by mouth 2 (two) times daily as needed.  527 g  5  . pravastatin (PRAVACHOL) 40 MG tablet Take 1 tablet (40 mg total) by mouth daily.  30 tablet  5  . PRAVASTATIN SODIUM PO Take by mouth daily.      Marland Kitchen zoster vaccine live, PF, (ZOSTAVAX) 40102 UNT/0.65ML injection Inject 19,400 Units into the skin once.  1 each  0  . zoster vaccine live, PF, (ZOSTAVAX) 72536 UNT/0.65ML injection Inject 19,400 Units into the skin once.  1 each  0   No current facility-administered medications on file prior to visit.   Allergies  Allergen Reactions  . Aspirin     Swelling, scratching in throat   Review of Systems  Constitutional: Positive for activity change and fatigue (occasional ).  Gastrointestinal: Negative for constipation.  Genitourinary: Positive for urgency. Negative for vaginal discharge and enuresis.  Musculoskeletal: Positive for myalgias.     BP  130/70  Pulse 76  Temp(Src) 98.4 F (36.9 C) (Oral)  Resp 16  Ht 5' 4.25" (1.632 m)  Wt 120 lb (54.432 kg)  BMI 20.44 kg/m2  SpO2 98%  Objective:   Physical Exam  Nursing note and vitals reviewed. Constitutional: She is oriented to person, place, and time. She appears well-developed and well-nourished. No distress.  HENT:  Head: Normocephalic and atraumatic.  Right Ear: External ear normal.  Left Ear: External ear normal.  Eyes: Conjunctivae are normal. No scleral icterus.  Neck: Normal range of motion. Neck supple. No thyromegaly present.  Cardiovascular: Normal rate, regular rhythm, normal heart sounds and intact distal pulses.   Pulmonary/Chest: Effort normal and breath sounds normal. No respiratory distress.    Lungs clear   Musculoskeletal: She exhibits no edema.  Lymphadenopathy:    She has no cervical adenopathy.  Neurological: She is alert and oriented to person, place, and time.  Skin: Skin is warm and dry. She is not diaphoretic. No erythema.  Psychiatric: She has a normal mood and affect. Her behavior is normal.      Results for orders placed in visit on 09/16/13  URINE CULTURE      Result Value Ref Range   Colony Count NO GROWTH     Organism ID, Bacteria NO GROWTH    POCT UA - MICROSCOPIC ONLY      Result Value Ref Range   WBC, Ur, HPF, POC 0-1     RBC, urine, microscopic 0-1     Bacteria, U Microscopic trace     Mucus, UA neg     Epithelial cells, urine per micros neg     Crystals, Ur, HPF, POC neg     Casts, Ur, LPF, POC neg     Yeast, UA neg    POCT URINALYSIS DIPSTICK      Result Value Ref Range   Color, UA yellow     Clarity, UA clear     Glucose, UA neg     Bilirubin, UA neg     Ketones, UA trace     Spec Grav, UA 1.020     Blood, UA neg     pH, UA 7.0     Protein, UA 30     Urobilinogen, UA 4.0     Nitrite, UA neg     Leukocytes, UA Negative      Assessment & Plan:   Urinary problem - Plan: POCT UA - Microscopic Only, POCT urinalysis dipstick, Urine culture  Need for shingles vaccine - Plan: zoster vaccine live, PF, (ZOSTAVAX) 94854 UNT/0.65ML injection  Urinary body odor - Plan: Urine culture  Meds ordered this encounter  Medications  . zoster vaccine live, PF, (ZOSTAVAX) 62703 UNT/0.65ML injection    Sig: Inject 19,400 Units into the skin once.    Dispense:  1 each    Refill:  0    I personally performed the services described in this documentation, which was scribed in my presence. The recorded information has been reviewed and considered, and addended by me as needed.  Delman Cheadle, MD MPH

## 2013-09-18 LAB — URINE CULTURE
Colony Count: NO GROWTH
Organism ID, Bacteria: NO GROWTH

## 2013-10-20 ENCOUNTER — Encounter: Payer: Self-pay | Admitting: Family Medicine

## 2013-10-21 ENCOUNTER — Encounter: Payer: Self-pay | Admitting: Family Medicine

## 2013-11-03 ENCOUNTER — Encounter: Payer: Self-pay | Admitting: Family Medicine

## 2014-01-16 ENCOUNTER — Encounter: Payer: Self-pay | Admitting: Internal Medicine

## 2014-01-27 ENCOUNTER — Ambulatory Visit: Payer: Medicare Other | Admitting: Family Medicine

## 2014-04-15 ENCOUNTER — Ambulatory Visit (INDEPENDENT_AMBULATORY_CARE_PROVIDER_SITE_OTHER): Payer: Medicare Other | Admitting: Family Medicine

## 2014-04-15 VITALS — BP 134/72 | HR 72 | Temp 98.2°F | Resp 18 | Ht 65.0 in | Wt 119.0 lb

## 2014-04-15 DIAGNOSIS — R1030 Lower abdominal pain, unspecified: Secondary | ICD-10-CM

## 2014-04-15 DIAGNOSIS — Z23 Encounter for immunization: Secondary | ICD-10-CM

## 2014-04-15 DIAGNOSIS — N898 Other specified noninflammatory disorders of vagina: Secondary | ICD-10-CM | POA: Diagnosis not present

## 2014-04-15 DIAGNOSIS — E119 Type 2 diabetes mellitus without complications: Secondary | ICD-10-CM

## 2014-04-15 LAB — COMPREHENSIVE METABOLIC PANEL
ALBUMIN: 4.3 g/dL (ref 3.5–5.2)
ALK PHOS: 63 U/L (ref 39–117)
ALT: 11 U/L (ref 0–35)
AST: 13 U/L (ref 0–37)
BILIRUBIN TOTAL: 0.5 mg/dL (ref 0.2–1.2)
BUN: 11 mg/dL (ref 6–23)
CO2: 24 mEq/L (ref 19–32)
Calcium: 9.3 mg/dL (ref 8.4–10.5)
Chloride: 107 mEq/L (ref 96–112)
Creat: 0.69 mg/dL (ref 0.50–1.10)
GLUCOSE: 92 mg/dL (ref 70–99)
POTASSIUM: 4.2 meq/L (ref 3.5–5.3)
Sodium: 139 mEq/L (ref 135–145)
TOTAL PROTEIN: 6.7 g/dL (ref 6.0–8.3)

## 2014-04-15 LAB — POCT CBC
Granulocyte percent: 58 % (ref 37–80)
HCT, POC: 41.3 % (ref 37.7–47.9)
Hemoglobin: 13.5 g/dL (ref 12.2–16.2)
Lymph, poc: 2.8 (ref 0.6–3.4)
MCH, POC: 32.4 pg — AB (ref 27–31.2)
MCHC: 32.7 g/dL (ref 31.8–35.4)
MCV: 99.3 fL — AB (ref 80–97)
MID (cbc): 0.3 (ref 0–0.9)
MPV: 7.5 fL (ref 0–99.8)
POC Granulocyte: 4.2 (ref 2–6.9)
POC LYMPH PERCENT: 38.5 % (ref 10–50)
POC MID %: 3.5 % (ref 0–12)
Platelet Count, POC: 295 10*3/uL (ref 142–424)
RBC: 4.15 M/uL (ref 4.04–5.48)
RDW, POC: 14.5 %
WBC: 7.2 10*3/uL (ref 4.6–10.2)

## 2014-04-15 LAB — POCT WET PREP WITH KOH
CLUE CELLS WET PREP PER HPF POC: NEGATIVE
KOH Prep POC: POSITIVE
RBC WET PREP PER HPF POC: NEGATIVE
Trichomonas, UA: NEGATIVE
Yeast Wet Prep HPF POC: POSITIVE

## 2014-04-15 LAB — POCT URINALYSIS DIPSTICK
BILIRUBIN UA: NEGATIVE
GLUCOSE UA: NEGATIVE
KETONES UA: NEGATIVE
Leukocytes, UA: NEGATIVE
Nitrite, UA: NEGATIVE
Protein, UA: NEGATIVE
RBC UA: NEGATIVE
Spec Grav, UA: 1.025
Urobilinogen, UA: 1
pH, UA: 6

## 2014-04-15 LAB — POCT UA - MICROSCOPIC ONLY
Bacteria, U Microscopic: NEGATIVE
Casts, Ur, LPF, POC: NEGATIVE
Crystals, Ur, HPF, POC: NEGATIVE
Epithelial cells, urine per micros: NEGATIVE
Mucus, UA: NEGATIVE
RBC, urine, microscopic: NEGATIVE
WBC, Ur, HPF, POC: NEGATIVE
Yeast, UA: NEGATIVE

## 2014-04-15 LAB — POCT GLYCOSYLATED HEMOGLOBIN (HGB A1C): Hemoglobin A1C: 5.3

## 2014-04-15 LAB — GLUCOSE, POCT (MANUAL RESULT ENTRY): POC GLUCOSE: 93 mg/dL (ref 70–99)

## 2014-04-15 MED ORDER — FLUCONAZOLE 150 MG PO TABS: 150.0000 mg | ORAL_TABLET | Freq: Once | ORAL | Status: DC

## 2014-04-15 NOTE — Patient Instructions (Signed)

## 2014-04-15 NOTE — Progress Notes (Signed)
Subjective:    Patient ID: Terry Armstrong, female    DOB: Jul 26, 1945, 68 y.o.   MRN: [id number] This chart was scribed for Delman Cheadle, MD by Zola Button, Medical Scribe. This patient was seen in Room 13 and the patient's care was started at 12:10 PM.  Chief Complaint  Patient presents with  . Vaginal Discharge    with odor since monday worse today   . Abdominal Pain    lower right side     HPI HPI Comments: Terry Armstrong is a 68 y.o. female who presents to the [facility] complaining of vaginal discharge with a fishy odor that began 5 days ago. Patient does not know what the discharge looks like because she has not looked at it. She also notes having some abdominal pain in her right inguinal area as well as some constipation problems that seems to have been going on for a while. She has been taking Miralax for constipation; she last took it 4 days ago. Patient denies any urinary symptoms. She has been compliant with her medications; she has received them from [facility] with my name on them. Patient had not eaten today, but she did have a coffee today with cream. She has not been checking her blood sugar levels at home.   Patient recently married in July; she is now sexually active. She denies dyspareunia.  Past Medical History  Diagnosis Date  . Diabetes mellitus without complication   . Breast abscess   . Hyperlipidemia   . Primary snoring    Current Outpatient Prescriptions on File Prior to Visit  Medication Sig Dispense Refill  . metFORMIN (GLUCOPHAGE) 500 MG tablet Take 1 tablet (500 mg total) by mouth 2 (two) times daily with a meal. 60 tablet 5  . multivitamin-iron-minerals-folic acid (CENTRUM) chewable tablet Chew 1 tablet by mouth daily.    . Omega-3 Fatty Acids (FISH OIL) 1200 MG CAPS Take 1 capsule by mouth daily.    . polyethylene glycol powder (GLYCOLAX/MIRALAX) powder Take 17 g by mouth 2 (two) times daily as needed. 527 g 5  . pravastatin (PRAVACHOL) 40  MG tablet Take 1 tablet (40 mg total) by mouth daily. 30 tablet 5  . alendronate (FOSAMAX) 70 MG tablet Take 1 tablet (70 mg total) by mouth once a week. Take with a full glass of water on an empty stomach. (Patient not taking: Reported on 04/15/2014) 12 tablet 3  . zoster vaccine live, PF, (ZOSTAVAX) 41962 UNT/0.65ML injection Inject 19,400 Units into the skin once. (Patient not taking: Reported on 04/15/2014) 1 each 0   No current facility-administered medications on file prior to visit.   Allergies  Allergen Reactions  . Aspirin     Swelling, scratching in throat     Review of Systems  Gastrointestinal: Positive for abdominal pain and constipation.  Genitourinary: Positive for vaginal discharge. Negative for dysuria, urgency, frequency, hematuria, decreased urine volume, difficulty urinating and dyspareunia.       Objective:  BP 134/72 mmHg  Pulse 72  Temp(Src) 98.2 F (36.8 C) (Oral)  Resp 18  Ht 5\' 5"  (1.651 m)  Wt 119 lb (53.978 kg)  BMI 19.80 kg/m2  SpO2 100%  Physical Exam  Constitutional: She is oriented to person, place, and time. She appears well-developed and well-nourished. No distress.  HENT:  Head: Normocephalic and atraumatic.  Mouth/Throat: Oropharynx is clear and moist. No oropharyngeal exudate.  Eyes: Pupils are equal, round, and reactive to light.  Neck: Neck supple.  No thyromegaly present.  Thyroid normal.  Cardiovascular: Normal rate and regular rhythm.   Murmur heard.  Systolic murmur is present with a grade of 2/6  2/6 systolic murmur, right upper sternal border.  Pulmonary/Chest: Effort normal and breath sounds normal. No respiratory distress. She has no wheezes. She has no rales.  Abdominal: Soft. Bowel sounds are normal. She exhibits no distension and no mass. There is no hepatosplenomegaly. There is tenderness in the right lower quadrant. There is no rebound and no guarding. Hernia confirmed negative in the right inguinal area and confirmed  negative in the left inguinal area.  Mild TTP over RLQ.  Genitourinary: Right adnexum displays no mass and no tenderness. Left adnexum displays no mass and no tenderness. Vaginal discharge found.  Labia majora and labia minora normal. Vaginal mucosa pink with small amount of white vaginal discharge. Cervix and uterus surgically absent. Vaginal cuff visualized without abnormality. No adnexal masses or tenderness on bimanual exam.  Musculoskeletal: She exhibits no edema.  Lymphadenopathy:    She has no cervical adenopathy.  Neurological: She is alert and oriented to person, place, and time. No cranial nerve deficit.  Skin: Skin is warm and dry. No rash noted.  Psychiatric: She has a normal mood and affect. Her behavior is normal.  Nursing note and vitals reviewed.     Results for orders placed or performed in visit on 04/15/14  Comprehensive metabolic panel  Result Value Ref Range   Sodium 139 135 - 145 mEq/L   Potassium 4.2 3.5 - 5.3 mEq/L   Chloride 107 96 - 112 mEq/L   CO2 24 19 - 32 mEq/L   Glucose, Bld 92 70 - 99 mg/dL   BUN 11 6 - 23 mg/dL   Creat 0.69 0.50 - 1.10 mg/dL   Total Bilirubin 0.5 0.2 - 1.2 mg/dL   Alkaline Phosphatase 63 39 - 117 U/L   AST 13 0 - 37 U/L   ALT 11 0 - 35 U/L   Total Protein 6.7 6.0 - 8.3 g/dL   Albumin 4.3 3.5 - 5.2 g/dL   Calcium 9.3 8.4 - 10.5 mg/dL  POCT urinalysis dipstick  Result Value Ref Range   Color, UA yellow    Clarity, UA clear    Glucose, UA neg    Bilirubin, UA neg    Ketones, UA neg    Spec Grav, UA 1.025    Blood, UA neg    pH, UA 6.0    Protein, UA neg    Urobilinogen, UA 1.0    Nitrite, UA neg    Leukocytes, UA Negative   POCT UA - Microscopic Only  Result Value Ref Range   WBC, Ur, HPF, POC neg    RBC, urine, microscopic neg    Bacteria, U Microscopic neg    Mucus, UA neg    Epithelial cells, urine per micros neg    Crystals, Ur, HPF, POC neg    Casts, Ur, LPF, POC neg    Yeast, UA neg   POCT CBC  Result Value  Ref Range   WBC 7.2 4.6 - 10.2 K/uL   Lymph, poc 2.8 0.6 - 3.4   POC LYMPH PERCENT 38.5 10 - 50 %L   MID (cbc) 0.3 0 - 0.9   POC MID % 3.5 0 - 12 %M   POC Granulocyte 4.2 2 - 6.9   Granulocyte percent 58.0 37 - 80 %G   RBC 4.15 4.04 - 5.48 M/uL   Hemoglobin 13.5 12.2 -  16.2 g/dL   HCT, POC 41.3 37.7 - 47.9 %   MCV 99.3 (A) 80 - 97 fL   MCH, POC 32.4 (A) 27 - 31.2 pg   MCHC 32.7 31.8 - 35.4 g/dL   RDW, POC 14.5 %   Platelet Count, POC 295 142 - 424 K/uL   MPV 7.5 0 - 99.8 fL  POCT glucose (manual entry)  Result Value Ref Range   POC Glucose 93 70 - 99 mg/dl  POCT Wet Prep with KOH  Result Value Ref Range   Trichomonas, UA Negative    Clue Cells Wet Prep HPF POC neg    Epithelial Wet Prep HPF POC 5-10    Yeast Wet Prep HPF POC positive    Bacteria Wet Prep HPF POC 1+    RBC Wet Prep HPF POC neg    WBC Wet Prep HPF POC 4-8    KOH Prep POC Positive   POCT glycosylated hemoglobin (Hb A1C)  Result Value Ref Range   Hemoglobin A1C 5.3     Assessment & Plan:   Lower abdominal pain - Plan: POCT urinalysis dipstick, POCT UA - Microscopic Only, POCT CBC, POCT Wet Prep with KOH, Comprehensive metabolic panel - suspect due to constipation, increase use of miralax and maintain on this  Vaginal discharge - Plan: POCT CBC, POCT glucose (manual entry), POCT Wet Prep with KOH, POCT glycosylated hemoglobin (Hb A1C), Comprehensive metabolic panel - YEAST infection  Need for prophylactic vaccination and inoculation against influenza - Plan: Flu Vaccine QUAD 36+ mos IM  Type 2 diabetes mellitus without complication - Plan: POCT glucose (manual entry), POCT glycosylated hemoglobin (Hb A1C) - doing EXCELLENT so decrease metformin from 500 bid to qam.  May be able to stop at f/u in 3-6 months  Meds ordered this encounter  Medications  . fluconazole (DIFLUCAN) 150 MG tablet    Sig: Take 1 tablet (150 mg total) by mouth once. Repeat if needed    Dispense:  2 tablet    Refill:  1    I  personally performed the services described in this documentation, which was scribed in my presence. The recorded information has been reviewed and considered, and addended by me as needed.  Delman Cheadle, MD MPH

## 2014-04-17 ENCOUNTER — Encounter: Payer: Self-pay | Admitting: Family Medicine

## 2014-04-17 ENCOUNTER — Telehealth: Payer: Self-pay | Admitting: Family Medicine

## 2014-04-17 NOTE — Telephone Encounter (Signed)
LMOM to CB. 

## 2014-04-24 NOTE — Progress Notes (Signed)
Appointment for fasting labs scheduled for 07/28/2014 @ 9:00 with Dr. Brigitte Pulse.  Patient notified.

## 2014-06-13 ENCOUNTER — Other Ambulatory Visit: Payer: Self-pay | Admitting: Family Medicine

## 2014-07-01 ENCOUNTER — Ambulatory Visit (INDEPENDENT_AMBULATORY_CARE_PROVIDER_SITE_OTHER): Payer: PPO | Admitting: Family Medicine

## 2014-07-01 ENCOUNTER — Ambulatory Visit (INDEPENDENT_AMBULATORY_CARE_PROVIDER_SITE_OTHER): Payer: PPO

## 2014-07-01 VITALS — BP 152/84 | HR 94 | Temp 97.3°F | Resp 18 | Ht 64.25 in | Wt 120.0 lb

## 2014-07-01 DIAGNOSIS — W109XXA Fall (on) (from) unspecified stairs and steps, initial encounter: Secondary | ICD-10-CM

## 2014-07-01 DIAGNOSIS — M79671 Pain in right foot: Secondary | ICD-10-CM

## 2014-07-01 DIAGNOSIS — S99911A Unspecified injury of right ankle, initial encounter: Secondary | ICD-10-CM | POA: Diagnosis not present

## 2014-07-01 DIAGNOSIS — S99921A Unspecified injury of right foot, initial encounter: Secondary | ICD-10-CM

## 2014-07-01 DIAGNOSIS — S92001A Unspecified fracture of right calcaneus, initial encounter for closed fracture: Secondary | ICD-10-CM

## 2014-07-01 MED ORDER — OXYCODONE-ACETAMINOPHEN 5-325 MG PO TABS: 1.0000 | ORAL_TABLET | Freq: Three times a day (TID) | ORAL | Status: DC | PRN

## 2014-07-01 NOTE — Patient Instructions (Signed)
Heel Spur A heel spur is a hook of bone that can form on the calcaneus (the heel bone and the largest bone of the foot). Heel spurs are often associated with plantar fasciitis and usually come in people who have had the problem for an extended period of time. The cause of the relationship is unknown. The pain associated with them is thought to be caused by an inflammation (soreness and redness) of the plantar fascia rather than the spur itself. The plantar fascia is a thick fibrous like tissue that runs from the calcaneus (heel bone) to the ball of the foot. This strong, tight tissue helps maintain the arch of your foot. It helps distribute the weight across your foot as you walk or run. Stresses placed on the plantar fascia can be tremendous. When it is inflamed normal activities become painful. Pain is worse in the morning after sleeping. After sleeping the plantar fascia is tight. The first movements stretch the fascia and this causes pain. As the tendon loosens, the pain usually gets better. It often returns with too much standing or walking.  About 70% of patients with plantar fasciitis have a heel spur. About half of people without foot pain also have heel spurs. DIAGNOSIS  The diagnosis of a heel spur is made by X-ray. The X-ray shows a hook of bone protruding from the bottom of the calcaneus at the point where the plantar fascia is attached to the heel bone.  TREATMENT  It is necessary to find out what is causing the stretching of the plantar fascia. If the cause is over-pronation (flat feet), orthotics and proper foot ware may help.  Stretching exercises, losing weight, wearing shoes that have a cushioned heel that absorbs shock, and elevating the heel with the use of a heel cradle, heel cup, or orthotics may all help. Heel cradles and heel cups provide extra comfort and cushion to the heel, and reduce the amount of shock to the sore area. AVOIDING THE PAIN OF PLANTAR FASCIITIS AND HEEL  SPURS  Consult a sports medicine professional before beginning a new exercise program.  Walking programs offer a good workout. There is a lower chance of overuse injuries common to the runners. There is less impact and less jarring of the joints.  Begin all new exercise programs slowly. If problems or pains develop, decrease the amount of time or distance until you are at a comfortable level.  Wear good shoes and replace them regularly.  Stretch your foot and the heel cords at the back of the ankle (Achilles tendons) both before and after exercise.  Run or exercise on even surfaces that are not hard. For example, asphalt is better than pavement.  Do not run barefoot on hard surfaces.  If using a treadmill, vary the incline.  Do not continue to workout if you have foot or joint problems. Seek professional help if they do not improve. HOME CARE INSTRUCTIONS   Avoid activities that cause you pain until you recover.  Use ice or cold packs to the problem or painful areas after working out.  Only take over-the-counter or prescription medicines for pain, discomfort, or fever as directed by your caregiver.  Soft shoe inserts or athletic shoes with air or gel sole cushions may be helpful.  If problems continue or become more severe, consult a sports medicine caregiver. Cortisone is a potent anti-inflammatory medication that may be injected into the painful area. You can discuss this treatment with your caregiver. MAKE SURE YOU:  Understand these instructions.  Will watch your condition.  Will get help right away if you are not doing well or get worse. Document Released: 05/28/2005 Document Revised: 07/14/2011 Document Reviewed: 06/22/2013 [facility] Patient Information 2015 [place], [place]. This information is not intended to replace advice given to you by your health care provider. Make sure you discuss any questions you have with your health care provider.

## 2014-07-01 NOTE — Progress Notes (Signed)
Subjective:  This chart was scribed for Dr. Delman Cheadle, MD by Erling Conte, ED Scribe. This patient was seen in Room 3 and the patient's care was started at 12:52 PM.   Patient ID: Terry Armstrong, female    DOB: Jul 08, 1945, 69 y.o.   MRN: [id number]  Chief Complaint  Patient presents with  . Fall    down stairs--right foot    HPI HPI Comments: Terry Armstrong is a 69 y.o. female who presents to the [facility] due to a fall that occurred yesterday. Pt states she fell down the stairs and injured her right foot. She is having some associated swelling. She has been using crutches to get around. She took Ibuprofen and Tylenol last night with no relief. Pt states she was not able to sleep much due to pain. She denies any numbness or weakness.   Past Medical History  Diagnosis Date  . Diabetes mellitus without complication   . Breast abscess   . Hyperlipidemia   . Primary snoring    Current Outpatient Prescriptions on File Prior to Visit  Medication Sig Dispense Refill  . alendronate (FOSAMAX) 70 MG tablet Take 1 tablet (70 mg total) by mouth once a week. Take with a full glass of water on an empty stomach. 12 tablet 3  . fluconazole (DIFLUCAN) 150 MG tablet Take 1 tablet (150 mg total) by mouth once. Repeat if needed 2 tablet 1  . metFORMIN (GLUCOPHAGE) 500 MG tablet TAKE ONE TABLET BY MOUTH TWICE DAILY WITH A MEAL 60 tablet 3  . multivitamin-iron-minerals-folic acid (CENTRUM) chewable tablet Chew 1 tablet by mouth daily.    . Omega-3 Fatty Acids (FISH OIL) 1200 MG CAPS Take 1 capsule by mouth daily.    . polyethylene glycol powder (GLYCOLAX/MIRALAX) powder Take 17 g by mouth 2 (two) times daily as needed. 527 g 5  . pravastatin (PRAVACHOL) 40 MG tablet TAKE ONE TABLET BY MOUTH ONCE DAILY 30 tablet 3  . zoster vaccine live, PF, (ZOSTAVAX) 03009 UNT/0.65ML injection Inject 19,400 Units into the skin once. 1 each 0   No current facility-administered medications on file  prior to visit.   Allergies  Allergen Reactions  . Aspirin     Swelling, scratching in throat    Review of Systems  Musculoskeletal: Positive for joint swelling (right foot) and arthralgias (right ankle and foot).  Skin: Negative for color change.  Neurological: Negative for weakness and numbness.       Vitals: BP 152/84 mmHg  Pulse 94  Temp(Src) 97.3 F (36.3 C) (Oral)  Resp 18  Ht 5' 4.25" (1.632 m)  Wt 120 lb (54.432 kg)  BMI 20.44 kg/m2  SpO2 97%  Objective:   Physical Exam  Constitutional: She is oriented to person, place, and time. She appears well-developed and well-nourished. No distress.  HENT:  Head: Normocephalic and atraumatic.  Eyes: Conjunctivae and EOM are normal.  Neck: Neck supple. No tracheal deviation present.  Cardiovascular: Normal rate.   Pulses:      Dorsalis pedis pulses are 2+ on the right side, and 2+ on the left side.       Posterior tibial pulses are 2+ on the right side, and 2+ on the left side.  Pulmonary/Chest: Effort normal. No respiratory distress.  Musculoskeletal: Normal range of motion.       Right ankle: Tenderness.       Right foot: There is tenderness.  Positive pain with tib fib pressure. Positive pain over left  ligament. No point tenderness over medial or lateral malleolus. She is having some significant pain to the posterior and distal to medial malleolus, same on lateral side. No tenderness over proximal 5th metatarsal. No significant tenderness over dorsal surface of metatarsals. Increased pain on plantar surface of proximal 1st and 2nd metatarsals  Neurological: She is alert and oriented to person, place, and time.  Skin: Skin is warm and dry.  Psychiatric: She has a normal mood and affect. Her behavior is normal.  Nursing note and vitals reviewed.  [facility] reading (PRIMARY) by  Dr. Brigitte Pulse -X-ray of right foot: Fracture seen in plantar surface of posterior calcaneous, non-displaced. osteoporosis present with some mild  degenerative changes of mid foot no other acute abnormalities seen - Right ankle x-ray: no acute abnormality    Dg Ankle Complete Right  07/01/2014   CLINICAL DATA:  Right ankle pain and swelling after fall on stairs last night. Initial encounter.  EXAM: RIGHT ANKLE - COMPLETE 3+ VIEW  COMPARISON:  None.  FINDINGS: There is no evidence of fracture, dislocation, or joint effusion in the ankle. Minimally displaced fracture of posterior calcaneus is noted. There is no evidence of arthropathy or other focal bone abnormality. Soft tissues are unremarkable.  IMPRESSION: No abnormality seen in the right ankle. Minimally displaced posterior calcaneal fracture is noted.   Electronically Signed   By: Marijo Conception, M.D.   On: 07/01/2014 13:52   Dg Foot Complete Right  07/01/2014   CLINICAL DATA:  Right foot injury from a fall on the stairs last night.  EXAM: RIGHT FOOT COMPLETE - 3+ VIEW  COMPARISON:  None.  FINDINGS: There is a minimally displaced fracture of the calcaneus, involving the posterior and inferior surfaces. Articular surface involvement is not fully assessed. No other fractures are identified. No dislocation.  IMPRESSION: Minimally displaced calcaneal fracture.   Electronically Signed   By: Nolon Nations M.D.   On: 07/01/2014 13:55    Assessment & Plan:   Foot injury, right, initial encounter - Plan: DG Foot Complete Right  Right ankle injury, initial encounter - Plan: DG Ankle Complete Right  Calcaneal fracture, right, closed, initial encounter - touch down weight-bearing only until pain resolved - then can gradually increase weight and ambulating as long as pain free.  RTC in 4-6 wks to  - pt has CPE sched for 6 wks so can repeat xray to document healing at that time. Ice, ROM.  Meds ordered this encounter  Medications  . oxyCODONE-acetaminophen (ROXICET) 5-325 MG per tablet    Sig: Take 1 tablet by mouth every 8 (eight) hours as needed for severe pain.    Dispense:  30 tablet     Refill:  0    I personally performed the services described in this documentation, which was scribed in my presence. The recorded information has been reviewed and considered, and addended by me as needed.  Delman Cheadle, MD MPH

## 2014-07-08 ENCOUNTER — Telehealth: Payer: Self-pay

## 2014-07-08 NOTE — Telephone Encounter (Signed)
Patient called stating she was seen recently by Dr. Brigitte Pulse and given a prescription for pain medication. The medication is not working and she would like to get something different. Preferred pharmacy is [facility] on [address]. Cb# [phone]

## 2014-07-09 ENCOUNTER — Other Ambulatory Visit: Payer: Self-pay

## 2014-07-09 NOTE — Telephone Encounter (Signed)
Pt would like a refill on her oxyCODONE-acetaminophen (ROXICET) 5-325 MG per tablet [[id number]] script. She can not come in until 3/25 for scheduled appointment with Dr. Brigitte Pulse. Please advise at [phone]

## 2014-07-09 NOTE — Telephone Encounter (Signed)
Pt called checking on status of medication. Please advise at [phone]

## 2014-07-09 NOTE — Telephone Encounter (Signed)
Oxycodone is the strongest pain medication that we prescribe.  It was prescribed as 1 tab every 8 hours - she can increase to EITHER 2 tabs every 8 hours OR 1 tab every 4 hours - she will need to follow up in OV for any additional refills so that we can ensure that her fracture is healing appropriately - she should not be having much pain as she should not be walking on her foot due to calcaneal fracture - she can do touchdown on her foot as long as it is not increasing in pain - her pain level makes me concerned that she is putting more pressure on her foot to get around with the crutches than she should which could impede healing - f/u w/ me this week to cons need for repeat imaging and/or referral.

## 2014-07-09 NOTE — Telephone Encounter (Signed)
Pt advised. Has not been weight bearing on her heel. Transferred to clerical to make a f/u appt to recheck this week

## 2014-07-10 NOTE — Telephone Encounter (Signed)
I spoke with Billing and they can re-submit the charge. Which code would you like to use?

## 2014-07-10 NOTE — Telephone Encounter (Signed)
See other phone note - I am concerned about pt's pain so really want her to f/u for eval - perhaps billing can work with me to see if we can resubmit her last OV and bill it under a fracture code charge so additional visits regarding this don't require copay. . . .

## 2014-07-14 MED ORDER — OXYCODONE-ACETAMINOPHEN 5-325 MG PO TABS: 1.0000 | ORAL_TABLET | ORAL | Status: DC | PRN

## 2014-07-14 NOTE — Telephone Encounter (Signed)
Oxycodone printed and signed for pt or her husband to pick up  - will look up fracture charge and submit next time am in clinic so pt doesn't have future copays for any visits about her foot during the healing period.

## 2014-07-14 NOTE — Telephone Encounter (Signed)
Notified pt rx is ready, and that Dr Brigitte Pulse is working on getting billing changes that would allow her to get back in before her appt. I advised we will be in touch with any new info about this.

## 2014-07-28 ENCOUNTER — Ambulatory Visit (INDEPENDENT_AMBULATORY_CARE_PROVIDER_SITE_OTHER): Payer: PPO | Admitting: Family Medicine

## 2014-07-28 ENCOUNTER — Encounter: Payer: Self-pay | Admitting: Family Medicine

## 2014-07-28 ENCOUNTER — Ambulatory Visit (INDEPENDENT_AMBULATORY_CARE_PROVIDER_SITE_OTHER): Payer: PPO

## 2014-07-28 VITALS — BP 121/71 | HR 83 | Temp 97.7°F | Resp 16 | Ht 65.0 in | Wt 117.8 lb

## 2014-07-28 DIAGNOSIS — M810 Age-related osteoporosis without current pathological fracture: Secondary | ICD-10-CM

## 2014-07-28 DIAGNOSIS — I10 Essential (primary) hypertension: Secondary | ICD-10-CM | POA: Diagnosis not present

## 2014-07-28 DIAGNOSIS — E041 Nontoxic single thyroid nodule: Secondary | ICD-10-CM | POA: Diagnosis not present

## 2014-07-28 DIAGNOSIS — Z79899 Other long term (current) drug therapy: Secondary | ICD-10-CM | POA: Diagnosis not present

## 2014-07-28 DIAGNOSIS — E119 Type 2 diabetes mellitus without complications: Secondary | ICD-10-CM

## 2014-07-28 DIAGNOSIS — S92001D Unspecified fracture of right calcaneus, subsequent encounter for fracture with routine healing: Secondary | ICD-10-CM | POA: Diagnosis not present

## 2014-07-28 DIAGNOSIS — E785 Hyperlipidemia, unspecified: Secondary | ICD-10-CM

## 2014-07-28 DIAGNOSIS — Z720 Tobacco use: Secondary | ICD-10-CM | POA: Diagnosis not present

## 2014-07-28 DIAGNOSIS — Z23 Encounter for immunization: Secondary | ICD-10-CM | POA: Diagnosis not present

## 2014-07-28 DIAGNOSIS — R7989 Other specified abnormal findings of blood chemistry: Secondary | ICD-10-CM

## 2014-07-28 LAB — COMPREHENSIVE METABOLIC PANEL
ALT: 8 U/L (ref 0–35)
AST: 9 U/L (ref 0–37)
Albumin: 4.3 g/dL (ref 3.5–5.2)
Alkaline Phosphatase: 91 U/L (ref 39–117)
BILIRUBIN TOTAL: 0.5 mg/dL (ref 0.2–1.2)
BUN: 12 mg/dL (ref 6–23)
CHLORIDE: 106 meq/L (ref 96–112)
CO2: 25 meq/L (ref 19–32)
CREATININE: 0.62 mg/dL (ref 0.50–1.10)
Calcium: 9.5 mg/dL (ref 8.4–10.5)
GLUCOSE: 99 mg/dL (ref 70–99)
Potassium: 4.5 mEq/L (ref 3.5–5.3)
Sodium: 142 mEq/L (ref 135–145)
Total Protein: 6.6 g/dL (ref 6.0–8.3)

## 2014-07-28 LAB — LIPID PANEL
Cholesterol: 220 mg/dL — ABNORMAL HIGH (ref 0–200)
HDL: 51 mg/dL (ref >=46)
LDL CALC: 139 mg/dL — AB (ref 0–99)
Total CHOL/HDL Ratio: 4.3 Ratio
Triglycerides: 148 mg/dL (ref <150)
VLDL: 30 mg/dL (ref 0–40)

## 2014-07-28 LAB — POCT GLYCOSYLATED HEMOGLOBIN (HGB A1C): Hemoglobin A1C: 5.9

## 2014-07-28 MED ORDER — ALENDRONATE SODIUM 70 MG PO TABS: 70.0000 mg | ORAL_TABLET | ORAL | Status: DC

## 2014-07-28 MED ORDER — ZOSTER VACCINE LIVE 19400 UNT/0.65ML ~~LOC~~ SOLR: 0.6500 mL | Freq: Once | SUBCUTANEOUS | Status: DC

## 2014-07-28 NOTE — Progress Notes (Addendum)
Subjective:    Patient ID: Terry Armstrong, female    DOB: [birth year], 69 y.o.   MRN: [id number] This chart was scribed for Terry Cheadle, MD by Zola Button, Medical Scribe. This patient was seen in Room 29 and the patient's care was started at 10:34 AM.   Chief Complaint  Patient presents with  . fasting blood work    HPI HPI Comments: Terry Armstrong is a 69 y.o. female with a hx of HTN, DM, and HDL who presents to the [facility] for fasting blood work.  Last saw 1 month ago for calcaneal fracture. 3 months ago, normal CMP, CBC. Hemoglobin A1C was 5.3. 1 year previously, vitamin D was normal at 65. Lipid panel was mildly elevated with an LDL of 132 and thyroid was borderline low but normalized. At that time, pravastatin was increased to atorvastatin. Urine microalbumin was normal. 1 year ago, when lipids were last checked, changed pravastatine to atorvastatin, but apparently she is back on pravastatin, but do not know why this was switched back. She is not taking her Fosamax and is taking her metformin twice a day. Dexa scan done at [facility] on January, 2015. Scanned into Epic. Shows osteoporosis, L-spine t-score -3.3. Weight has remained stable.  Right Calcaneal Fracture: Patient states her right foot has been doing well with not much pain. She states there is not much pain when ambulating at home. She has not had to take any pain medications for the past 2 days.   Lump on Head: When the patient fell 1 month ago, she felt like she had a lump to the right side of her scalp.  Hyperlipidemia: Patient is unsure why she did not get atorvastatin.  Vitamins/Supplements: Patient takes multivitamins and fish oil.  Constipation: She has had some constipation attributed to medications. She has been using laxatives with relief to her constipation.  Immunizations: Patient has not received the Armstrong vaccine.  Patient was recently married and is happy.  Review of Systems    Gastrointestinal: Positive for constipation.       Objective:  BP 121/71 mmHg  Pulse 83  Temp(Src) 97.7 F (36.5 C) (Oral)  Resp 16  Ht 5\' 5"  (1.651 m)  Wt 117 lb 12.8 oz (53.434 kg)  BMI 19.60 kg/m2  SpO2 96%  Physical Exam  Constitutional: She is oriented to person, place, and time. She appears well-developed and well-nourished. No distress.  HENT:  Head: Normocephalic and atraumatic.  Mouth/Throat: Oropharynx is clear and moist. No oropharyngeal exudate.  Eyes: Pupils are equal, round, and reactive to light.  Neck: Neck supple. Thyromegaly present.  Right increase in thyromegaly.  Cardiovascular: Normal rate, regular rhythm, S1 normal, S2 normal and normal heart sounds.   No murmur heard. Pulses:      Dorsalis pedis pulses are 2+ on the right side.       Posterior tibial pulses are 2+ on the right side.  Pulmonary/Chest: Effort normal and breath sounds normal. No respiratory distress. She has no wheezes. She has no rales.  Musculoskeletal: She exhibits tenderness. She exhibits no edema.  Tenderness over plantar calcaneous. No lower extremity edema.  Neurological: She is alert and oriented to person, place, and time. No cranial nerve deficit.  Skin: Skin is warm and dry. No rash noted.  5 mm soft, non-tender, non-mnile subcutaneous nodule to right anterior parietal scalp.  Psychiatric: She has a normal mood and affect. Her behavior is normal.  Vitals reviewed.   [facility] (PRIMARY) x-ray  report read by Dr. Brigitte Pulse: Right calcaneal fracture - no callus formation but unchanged from prior. Appears to be healing well.     .  Assessment & Plan:   Calcaneal fracture, right, with routine healing, subsequent encounter - Plan: DG Os Calcis Right - improving.  Essential hypertension  Type 2 diabetes mellitus without complication - Plan: POCT glycosylated hemoglobin (Hb A1C) - a1c excellent at 5.9 so try stopping metformin to see if pt can remain diet-controlled. Recheck in 4 mos.  F/u  w/ optho for annual diabetic retinopathy exam. Cont asa 81 qd.  Patient needs urine micro albumin at follow-up  Tobacco user  - pt will attempt cessaiton  Hyperlipidemia with target LDL less than 100 - Plan: Lipid panel - If pt can remain diet-controlled of her diabetes, we may be able to loosen her lipid goals. ldl worsening so try again to start atorvastatin 40 from pravastatin 40.  Polypharmacy - Plan: Comprehensive metabolic panel  Right thyroid nodule - Plan: TSH - TSH suppressed - recheck TFTs after 6 wks - if persists needs imaging  Osteoporosis - unsure why she is not on a bisphosphate - likely just fell of MAR.  Restart fosamax as well as calcium 600 bid w/ vit D  >800u  Need for zoster vaccination  Need for Armstrong vaccine - Plan: zoster vaccine live, PF, (ZOSTAVAX) 44315 UNT/0.65ML injection  Need for vaccination with 13-polyvalent pneumococcal conjugate vaccine - give at next OV.  Meds ordered this encounter  Medications  . alendronate (FOSAMAX) 70 MG tablet    Sig: Take 1 tablet (70 mg total) by mouth once a week. Take with a full glass of water on an empty stomach.    Dispense:  12 tablet    Refill:  3  . zoster vaccine live, PF, (ZOSTAVAX) 40086 UNT/0.65ML injection    Sig: Inject 19,400 Units into the skin once.    Dispense:  1 each    Refill:  0    I personally performed the services described in this documentation, which was scribed in my presence. The recorded information has been reviewed and considered, and addended by me as needed.  Terry Cheadle, MD MPH  Results for orders placed or performed in visit on 07/28/14  Comprehensive metabolic panel  Result Value Ref Range   Sodium 142 135 - 145 mEq/L   Potassium 4.5 3.5 - 5.3 mEq/L   Chloride 106 96 - 112 mEq/L   CO2 25 19 - 32 mEq/L   Glucose, Bld 99 70 - 99 mg/dL   BUN 12 6 - 23 mg/dL   Creat 0.62 0.50 - 1.10 mg/dL   Total Bilirubin 0.5 0.2 - 1.2 mg/dL   Alkaline Phosphatase 91 39 - 117 U/L   AST 9 0 - 37  U/L   ALT 8 0 - 35 U/L   Total Protein 6.6 6.0 - 8.3 g/dL   Albumin 4.3 3.5 - 5.2 g/dL   Calcium 9.5 8.4 - 10.5 mg/dL  Lipid panel  Result Value Ref Range   Cholesterol 220 (H) 0 - 200 mg/dL   Triglycerides 148 <150 mg/dL   HDL 51 >=46 mg/dL   Total CHOL/HDL Ratio 4.3 Ratio   VLDL 30 0 - 40 mg/dL   LDL Cholesterol 139 (H) 0 - 99 mg/dL  TSH  Result Value Ref Range   TSH 0.168 (L) 0.350 - 4.500 uIU/mL  POCT glycosylated hemoglobin (Hb A1C)  Result Value Ref Range   Hemoglobin A1C 5.9

## 2014-07-28 NOTE — Patient Instructions (Signed)
Stop your metformin.  Return to clinic in 4 months so we can make sure your blood sugars are doing great off this medication.  Make sure you have seen your eye doctor in the past year You do NOT need to be fasting at your next visit but we will check your urine and check your sugar level again. STOP SMOKING Take aspirin 73m daily. Restart the fosamax for your osteoporosis. Make sure you are taking 6070mof caclium twice a day with at least 800u of vitamin D at the same time. Be gentle with your foot.  Continue to use support whenever needed. If you are still having any pain in 1 month, please come back into clinic so we can see why. Osteoporosis Throughout your life, your body breaks down old bone and replaces it with new bone. As you get older, your body does not replace bone as quickly as it breaks it down. By the age of 3042ears, most people begin to gradually lose bone because of the imbalance between bone loss and replacement. Some people lose more bone than others. Bone loss beyond a specified normal degree is considered osteoporosis.  Osteoporosis affects the strength and durability of your bones. The inside of the ends of your bones and your flat bones, like the bones of your pelvis, look like honeycomb, filled with tiny open spaces. As bone loss occurs, your bones become less dense. This means that the open spaces inside your bones become bigger and the walls between these spaces become thinner. This makes your bones weaker. Bones of a person with osteoporosis can become so weak that they can break (fracture) during minor accidents, such as a simple fall. CAUSES  The following factors have been associated with the development of osteoporosis:  Smoking.  Drinking more than 2 alcoholic drinks several days per week.  Long-term use of certain medicines:  Corticosteroids.  Chemotherapy medicines.  Thyroid medicines.  Antiepileptic medicines.  Gonadal hormone suppression  medicine.  Immunosuppression medicine.  Being underweight.  Lack of physical activity.  Lack of exposure to the sun. This can lead to vitamin D deficiency.  Certain medical conditions:  Certain inflammatory bowel diseases, such as Crohn disease and ulcerative colitis.  Diabetes.  Hyperthyroidism.  Hyperparathyroidism. RISK FACTORS Anyone can develop osteoporosis. However, the following factors can increase your risk of developing osteoporosis:  Gender--Women are at higher risk than men.  Age--Being older than 50 years increases your risk.  Ethnicity--White and Asian people have an increased risk.  Weight --Being extremely underweight can increase your risk of osteoporosis.  Family history of osteoporosis--Having a family member who has developed osteoporosis can increase your risk. SYMPTOMS  Usually, people with osteoporosis have no symptoms.  DIAGNOSIS  Signs during a physical exam that may prompt your caregiver to suspect osteoporosis include:  Decreased height. This is usually caused by the compression of the bones that form your spine (vertebrae) because they have weakened and become fractured.  A curving or rounding of the upper back (kyphosis). To confirm signs of osteoporosis, your caregiver may request a procedure that uses 2 low-dose X-ray beams with different levels of energy to measure your bone mineral density (dual-energy X-ray absorptiometry [DXA]). Also, your caregiver may check your level of vitamin D. TREATMENT  The goal of osteoporosis treatment is to strengthen bones in order to decrease the risk of bone fractures. There are different types of medicines available to help achieve this goal. Some of these medicines work by slowing the processes  of bone loss. Some medicines work by increasing bone density. Treatment also involves making sure that your levels of calcium and vitamin D are adequate. PREVENTION  There are things you can do to help prevent  osteoporosis. Adequate intake of calcium and vitamin D can help you achieve optimal bone mineral density. Regular exercise can also help, especially resistance and weight-bearing activities. If you smoke, quitting smoking is an important part of osteoporosis prevention. MAKE SURE YOU:  Understand these instructions.  Will watch your condition.  Will get help right away if you are not doing well or get worse. FOR MORE INFORMATION www.osteo.org and EquipmentWeekly.com.ee Document Released: 01/29/2005 Document Revised: 08/16/2012 Document Reviewed: 04/05/2011 [facility] Patient Information 2015 [place], [place]. This information is not intended to replace advice given to you by your health care provider. Make sure you discuss any questions you have with your health care provider.  Complementary and Alternative Medical Therapies for Diabetes Complementary and alternative medicines are health care practices or products that are not always accepted as part of routine medicine. Complementary medicine is used along with routine medicine (medical therapy). Alternative medicine can sometimes be used instead of routine medicine. Some people use these methods to treat diabetes. While some of these therapies may be effective, others may not be. Some may even be harmful. Patients using these methods need to tell their caregiver. It is important to let your caregivers know what you are doing. Some of these therapies are discussed below. For more information, talk with your caregiver. THERAPIES Acupuncture Acupuncture is done by a professional who inserts needles into certain points on the skin. Some scientists believe that this triggers the release of the body's natural painkillers. It has been shown to relieve long-term (chronic) pain. This may help patients with painful nerve damage caused by diabetes. Biofeedback Biofeedback helps a person become more aware of the body's response to pain. It also helps you learn to deal  with the pain. This alternative therapy focuses on relaxation and stress-reduction techniques. Thinking of peaceful mental images (guided imagery) is one technique. Some people believe these images can ease their condition. MEDICATIONS Chromium Several studies report that chromium supplements may improve diabetes control. Chromium helps insulin improve its action. Research is not yet certain. Supplements have not been recommended or approved. Caution is needed if you have kidney (renal) problems. Ginseng There are several types of ginseng plants. American ginseng is used for diabetes studies. Those studies have shown some glucose-lowering effects. Those effects have been seen with fasting and after-meal blood glucose levels. They have also been seen in A1c levels (average blood glucose levels over a 86-monthperiod). More long-term studies are needed before recommendations for use of ginseng can be made. Magnesium Experts have studied the relationship between magnesium and diabetes for many years. But it is not yet fully understood. Studies suggest that a low amount of magnesium may make blood glucose control worse in type 2 diabetes. Research also shows that a low amount may contribute to certain diabetes complications. One study showed that people who consume more magnesium had less risk of type 2 diabetes. Eating whole grains, nuts, and green leafy vegetables raises the magnesium level. Vanadium Vanadium is a compound found in tiny amounts in plants and animals. Early studies showed that vanadium improved blood glucose levels in animals with type 1 and type 2 diabetes. One study found that when given vanadium, those with diabetes were able to decrease their insulin dosage. Researchers still need to learn how it  works in the body to discover any side effects, and to find safe dosages. Cinnamon There have been a couple of studies that seem to indicate cinnamon decreases insulin resistance and increases  insulin production. By doing so, it may lower blood glucose. Exact doses are unknown, but it may work best when used in combination with other diabetes medicines. Document Released: 02/16/2007 Document Revised: 07/14/2011 Document Reviewed: 03/01/2009 [facility] Patient Information 2015 [place], [place]. This information is not intended to replace advice given to you by your health care provider. Make sure you discuss any questions you have with your health care provider. Diabetes and Standards of Medical Care Diabetes is complicated. You may find that your diabetes team includes a dietitian, nurse, diabetes educator, eye doctor, and more. To help everyone know what is going on and to help you get the care you deserve, the following schedule of care was developed to help keep you on track. Below are the tests, exams, vaccines, medicines, education, and plans you will need. HbA1c test This test shows how well you have controlled your glucose over the past 2-3 months. It is used to see if your diabetes management plan needs to be adjusted.   It is performed at least 2 times a year if you are meeting treatment goals.  It is performed 4 times a year if therapy has changed or if you are not meeting treatment goals. Blood pressure test  This test is performed at every routine medical visit. The goal is less than 140/90 mm Hg for most people, but 130/80 mm Hg in some cases. Ask your health care provider about your goal. Dental exam  Follow up with the dentist regularly. Eye exam  If you are diagnosed with type 1 diabetes as a child, get an exam upon reaching the age of 68 years or older and have had diabetes for 3-5 years. Yearly eye exams are recommended after that initial eye exam.  If you are diagnosed with type 1 diabetes as an adult, get an exam within 5 years of diagnosis and then yearly.  If you are diagnosed with type 2 diabetes, get an exam as soon as possible after the diagnosis and then  yearly. Foot care exam  Visual foot exams are performed at every routine medical visit. The exams check for cuts, injuries, or other problems with the feet.  A comprehensive foot exam should be done yearly. This includes visual inspection as well as assessing foot pulses and testing for loss of sensation.  Check your feet nightly for cuts, injuries, or other problems with your feet. Tell your health care provider if anything is not healing. Kidney function test (urine microalbumin)  This test is performed once a year.  Type 1 diabetes: The first test is performed 5 years after diagnosis.  Type 2 diabetes: The first test is performed at the time of diagnosis.  A serum creatinine and estimated glomerular filtration rate (eGFR) test is done once a year to assess the level of chronic kidney disease (CKD), if present. Lipid profile (cholesterol, HDL, LDL, triglycerides)  Performed every 5 years for most people.  The goal for LDL is less than 100 mg/dL. If you are at high risk, the goal is less than 70 mg/dL.  The goal for HDL is 40 mg/dL-50 mg/dL for men and 50 mg/dL-60 mg/dL for women. An HDL cholesterol of 60 mg/dL or higher gives some protection against heart disease.  The goal for triglycerides is less than 150 mg/dL. Influenza vaccine,  pneumococcal vaccine, and hepatitis B vaccine  The influenza vaccine is recommended yearly.  It is recommended that people with diabetes who are over 68 years old get the pneumonia vaccine. In some cases, two separate shots may be given. Ask your health care provider if your pneumonia vaccination is up to date.  The hepatitis B vaccine is also recommended for adults with diabetes. Diabetes self-management education  Education is recommended at diagnosis and ongoing as needed. Treatment plan  Your treatment plan is reviewed at every medical visit. Document Released: 02/16/2009 Document Revised: 09/05/2013 Document Reviewed: 09/21/2012 [facility]  Patient Information 2015 [place], [place]. This information is not intended to replace advice given to you by your health care provider. Make sure you discuss any questions you have with your health care provider. Diabetes Mellitus and Food It is important for you to manage your blood sugar (glucose) level. Your blood glucose level can be greatly affected by what you eat. Eating healthier foods in the appropriate amounts throughout the day at about the same time each day will help you control your blood glucose level. It can also help slow or prevent worsening of your diabetes mellitus. Healthy eating may even help you improve the level of your blood pressure and reach or maintain a healthy weight.  HOW CAN FOOD AFFECT ME? Carbohydrates Carbohydrates affect your blood glucose level more than any other type of food. Your dietitian will help you determine how many carbohydrates to eat at each meal and teach you how to count carbohydrates. Counting carbohydrates is important to keep your blood glucose at a healthy level, especially if you are using insulin or taking certain medicines for diabetes mellitus. Alcohol Alcohol can cause sudden decreases in blood glucose (hypoglycemia), especially if you use insulin or take certain medicines for diabetes mellitus. Hypoglycemia can be a life-threatening condition. Symptoms of hypoglycemia (sleepiness, dizziness, and disorientation) are similar to symptoms of having too much alcohol.  If your health care provider has given you approval to drink alcohol, do so in moderation and use the following guidelines:  Women should not have more than one drink per day, and men should not have more than two drinks per day. One drink is equal to:  12 oz of beer.  5 oz of wine.  1 oz of hard liquor.  Do not drink on an empty stomach.  Keep yourself hydrated. Have water, diet soda, or unsweetened iced tea.  Regular soda, juice, and other mixers might contain a lot of  carbohydrates and should be counted. WHAT FOODS ARE NOT RECOMMENDED? As you make food choices, it is important to remember that all foods are not the same. Some foods have fewer nutrients per serving than other foods, even though they might have the same number of calories or carbohydrates. It is difficult to get your body what it needs when you eat foods with fewer nutrients. Examples of foods that you should avoid that are high in calories and carbohydrates but low in nutrients include:  Trans fats (most processed foods list trans fats on the Nutrition Facts label).  Regular soda.  Juice.  Candy.  Sweets, such as cake, pie, doughnuts, and cookies.  Fried foods. WHAT FOODS CAN I EAT? Have nutrient-rich foods, which will nourish your body and keep you healthy. The food you should eat also will depend on several factors, including:  The calories you need.  The medicines you take.  Your weight.  Your blood glucose level.  Your blood pressure level.  Your cholesterol level. You also should eat a variety of foods, including:  Protein, such as meat, poultry, fish, tofu, nuts, and seeds (lean animal proteins are best).  Fruits.  Vegetables.  Dairy products, such as milk, cheese, and yogurt (low fat is best).  Breads, grains, pasta, cereal, rice, and beans.  Fats such as olive oil, trans fat-free margarine, canola oil, avocado, and olives. DOES EVERYONE WITH DIABETES MELLITUS HAVE THE SAME MEAL PLAN? Because every person with diabetes mellitus is different, there is not one meal plan that works for everyone. It is very important that you meet with a dietitian who will help you create a meal plan that is just right for you. Document Released: 01/16/2005 Document Revised: 04/26/2013 Document Reviewed: 03/18/2013 [facility] Patient Information 2015 [place], [place]. This information is not intended to replace advice given to you by your health care provider. Make sure you discuss any  questions you have with your health care provider.

## 2014-07-28 NOTE — Telephone Encounter (Signed)
Pt seen in office today for her CPE  - she is doing well and hopefully will not need additional follow-up so may just leave as is

## 2014-07-29 LAB — TSH: TSH: 0.168 u[IU]/mL — ABNORMAL LOW (ref 0.350–4.500)

## 2014-08-30 ENCOUNTER — Encounter: Payer: Self-pay | Admitting: Family Medicine

## 2014-09-06 ENCOUNTER — Encounter: Payer: Self-pay | Admitting: Internal Medicine

## 2014-09-09 MED ORDER — ATORVASTATIN CALCIUM 40 MG PO TABS: 40.0000 mg | ORAL_TABLET | Freq: Every day | ORAL | Status: DC

## 2014-09-09 NOTE — Addendum Note (Signed)
Addended by: Delman Cheadle on: 09/09/2014 11:24 PM   Modules accepted: Orders, Medications

## 2014-12-01 ENCOUNTER — Ambulatory Visit: Payer: PPO | Admitting: Family Medicine

## 2015-01-10 DIAGNOSIS — M810 Age-related osteoporosis without current pathological fracture: Secondary | ICD-10-CM | POA: Insufficient documentation

## 2015-01-10 NOTE — Progress Notes (Signed)
Subjective:    Patient ID: Jonn Shingles, female    DOB: [birth year], 69 y.o.   MRN: [id number] Chief Complaint  Patient presents with  . Follow-up    wants thyroid checked  . Diabetes   Husband turned 8 HPI  Ms. Denes is a delightful 69 yo female who is here today for a 6 mo f/u OV of her chronic medical conditions.  Type II DM:  a1c was 5.9 at last OV 6 mos prior so pt was going to go off metformin to try diet control.  Has had annual DM optho exam??  On asa 81mg  and statin.  No acei/arb.  Tobacco use:  Osteoporosis: Last DEXA scan was 1/15 at [facility] which showed L-spine T-score of -3.2.   Vit D 65 04/2013 nml at 74. On fosamax, taking ca/vit D?    HPL:  Pt was increased from pravastatin 40 to atorvastatin 40 at last visit since LDL was not <100.  She is tolerating the fish oil well  HTN: currently exhausted frm having 4 generations of her family at her house for her husbands 75th birthday. Hyperthyroid:  TSH mildly suppressed at visit 6 mo prior - pt was asked to return for full thyroid panel to confirm but has not returned until today.  Pt has a sleep study around 06/14/2013 that showed periodic leg movement arousals which affect patients sleep quality, evidence of Upper airway resistance syndrome with a respiratory disturbance index of 13.9 and mild obstructive sleep apnea.  Pt did not f/u w/ Dr. Brett Fairy as she was recommended to discuss treatment options. Pt reports that she is still not sleeping well.  Pt and her husband are leaving on a vacation next wk - taking the train down to [place] where they will be for 5d - pt really looking forward to this.  Past Medical History  Diagnosis Date  . Diabetes mellitus without complication   . Breast abscess   . Hyperlipidemia   . Primary snoring    Past Surgical History  Procedure Laterality Date  . Abdominal hysterectomy  1993   Current Outpatient Prescriptions on File Prior to Visit  Medication Sig Dispense Refill  .  alendronate (FOSAMAX) 70 MG tablet Take 1 tablet (70 mg total) by mouth once a week. Take with a full glass of water on an empty stomach. 12 tablet 3  . multivitamin-iron-minerals-folic acid (CENTRUM) chewable tablet Chew 1 tablet by mouth daily.    . Omega-3 Fatty Acids (FISH OIL) 1200 MG CAPS Take 1 capsule by mouth daily.     No current facility-administered medications on file prior to visit.   Allergies  Allergen Reactions  . Aspirin     Swelling, scratching in throat   Family History  Problem Relation Age of Onset  . Diabetes    . Hypertension Sister   . Hyperlipidemia    . Heart disease Sister     defibrillator  . Kidney failure Maternal Aunt     dialysis  . Hypertension Maternal Aunt   . Heart Problems Maternal Grandmother   . Cancer Maternal Grandmother    Social History   Social History  . Marital Status: Single    Spouse Name: N/A  . Number of Children: 0  . Years of Education: 14   Occupational History  .     Social History Main Topics  . Smoking status: Current Every Day Smoker -- 1.00 packs/day for 28 years    Types: Cigarettes  . Smokeless tobacco:  Never Used  . Alcohol Use: Yes     Comment: social  . Drug Use: No  . Sexual Activity: Not Asked   Other Topics Concern  . None   Social History Narrative   Patient lives with her significant other.   Patient is retired.   Patient drinks 2-3 cups of caffeine daily in the winter.   Patient has a college education.   Patient is right-handed.           Review of Systems  Constitutional: Positive for fatigue. Negative for fever, chills, diaphoresis, activity change, appetite change and unexpected weight change.  HENT: Positive for congestion and postnasal drip.   Eyes: Negative for visual disturbance.  Respiratory: Negative for cough and shortness of breath.   Cardiovascular: Negative for chest pain, palpitations and leg swelling.  Gastrointestinal: Negative for nausea, vomiting, abdominal pain and  diarrhea.  Genitourinary: Negative for decreased urine volume.  Musculoskeletal: Negative for myalgias and gait problem.  Skin: Negative for rash.  Neurological: Negative for syncope, numbness and headaches.  Hematological: Does not bruise/bleed easily.  Psychiatric/Behavioral: Positive for sleep disturbance.       Objective:  BP 154/75 mmHg  Pulse 75  Temp(Src) 98.9 F (37.2 C)  Resp 16  Ht 5\' 5"  (1.651 m)  Wt 120 lb (54.432 kg)  BMI 19.97 kg/m2  Physical Exam  Constitutional: She is oriented to person, place, and time. She appears well-developed and well-nourished. No distress.  HENT:  Head: Normocephalic and atraumatic.  Right Ear: External ear normal.  Left Ear: External ear normal.  Eyes: Conjunctivae are normal. No scleral icterus.  Neck: Normal range of motion. Neck supple. No thyromegaly present.  Cardiovascular: Normal rate, regular rhythm, normal heart sounds and intact distal pulses.   Pulmonary/Chest: Effort normal and breath sounds normal. No respiratory distress.  Musculoskeletal: She exhibits no edema.  Lymphadenopathy:    She has no cervical adenopathy.  Neurological: She is alert and oriented to person, place, and time.  Skin: Skin is warm and dry. She is not diaphoretic. No erythema.  Psychiatric: She has a normal mood and affect. Her behavior is normal.   Diabetic Foot Exam - Simple   Simple Foot Form  Diabetic Foot exam was performed with the following findings:  Yes 01/11/2015  4:22 PM  Visual Inspection  No deformities, no ulcerations, no other skin breakdown bilaterally:  Yes  Sensation Testing  Intact to touch and monofilament testing bilaterally:  Yes  Pulse Check  Posterior Tibialis and Dorsalis pulse intact bilaterally:  Yes  Comments         Results for orders placed or performed in visit on 01/11/15  Comprehensive metabolic panel  Result Value Ref Range   Sodium 140 135 - 146 mmol/L   Potassium 4.4 3.5 - 5.3 mmol/L   Chloride 105 98 -  110 mmol/L   CO2 26 20 - 31 mmol/L   Glucose, Bld 91 65 - 99 mg/dL   BUN 10 7 - 25 mg/dL   Creat 0.68 0.50 - 0.99 mg/dL   Total Bilirubin 0.8 0.2 - 1.2 mg/dL   Alkaline Phosphatase 77 33 - 130 U/L   AST 14 10 - 35 U/L   ALT 11 6 - 29 U/L   Total Protein 6.5 6.1 - 8.1 g/dL   Albumin 4.1 3.6 - 5.1 g/dL   Calcium 9.2 8.6 - 10.4 mg/dL  Lipid panel  Result Value Ref Range   Cholesterol 205 (H) 125 - 200 mg/dL  Triglycerides 100 <150 mg/dL   HDL 64 >=46 mg/dL   Total CHOL/HDL Ratio 3.2 <=5.0 Ratio   VLDL 20 <30 mg/dL   LDL Cholesterol 121 <130 mg/dL  Thyroid Panel With TSH  Result Value Ref Range   T4, Total 7.0 4.5 - 12.0 ug/dL   T3 Uptake 30 22 - 35 %   Free Thyroxine Index 2.1 1.4 - 3.8   TSH 0.266 (L) 0.350 - 4.500 uIU/mL  POCT glycosylated hemoglobin (Hb A1C)  Result Value Ref Range   Hemoglobin A1C 5.8     Assessment & Plan:   flu shot done today Needs Prevnar at next OV - pt declines today 1. Essential hypertension - check bp w/ cuff at home - has been well controlled prior   2. Type 2 diabetes mellitus without complication - doing amazing!  Has been off of metformin for 6 wks and a1c is down to 5.8 (was 5.9 6 mo prior). Pt reminded to make f/u OV with Dr. Katy Fitch - last OV 08/30/13 was nml - pt requests repeat referral. Feet nml  3. Tobacco user   4. Hyperlipidemia with target LDL less than 100 - LDL not at goal at 121 on atorvastatin but as in pre-DM range off meds ok w/ leaving statin at current dose - recheck in 6 mos  5. Encounter for medication monitoring   6. Right thyroid nodule - hx noted in problem list but I do not see any prior imagine documenting this - rec repeat US - discuss at f/u  7. Abnormal TSH - suppressed but t3/t4 levels nml and pt without sig sxs. Reviewed sxs and risks of hyperthyroidism as well as various treatment options but pt asymptomatic and labs stable so will continue watchful waiting - recheck every 6 mos  8. Osteoporosis - At next oV will be  due for repeat dexa at [facility]. Has been forgetting to take fosamax but will restart  9. Need for immunization against Staphylococcal infection - agrees to prevnar at next OV in 6 mos  10. Need for prophylactic vaccination and inoculation against influenza   11. Need for hepatitis C screening test   12.     Poor sleep - Try flonase qhs due to upper airway resistance syndrome. Could f/u w/ Dr. Maureen Chatters to get further options or try a med for restless leg.  F/u in 6 mos for repeat fasting labs with CPE/AWV  Orders Placed This Encounter  Procedures  . Flu Vaccine QUAD 36+ mos IM  . Comprehensive metabolic panel    Order Specific Question:  Has the patient fasted?    Answer:  Yes  . Microalbumin/Creatinine Ratio, Urine  . Lipid panel    Order Specific Question:  Has the patient fasted?    Answer:  Yes  . Thyroid Panel With TSH  . Hepatitis C antibody  . Ambulatory referral to Ophthalmology    Referral Priority:  Routine    Referral Type:  Consultation    Referral Reason:  Specialty Services Required    Requested Specialty:  Ophthalmology    Number of Visits Requested:  1  . POCT glycosylated hemoglobin (Hb A1C)    Meds ordered this encounter  Medications  . atorvastatin (LIPITOR) 40 MG tablet    Sig: Take 1 tablet (40 mg total) by mouth daily.    Dispense:  90 tablet    Refill:  3  . fluticasone (FLONASE) 50 MCG/ACT nasal spray    Sig: Place 2 sprays into both nostrils  at bedtime.    Dispense:  16 g    Refill:  11     Delman Cheadle, MD MPH

## 2015-01-11 ENCOUNTER — Encounter: Payer: Self-pay | Admitting: Family Medicine

## 2015-01-11 ENCOUNTER — Ambulatory Visit (INDEPENDENT_AMBULATORY_CARE_PROVIDER_SITE_OTHER): Payer: PPO | Admitting: Family Medicine

## 2015-01-11 VITALS — BP 154/75 | HR 75 | Temp 98.9°F | Resp 16 | Ht 65.0 in | Wt 120.0 lb

## 2015-01-11 DIAGNOSIS — Z23 Encounter for immunization: Secondary | ICD-10-CM

## 2015-01-11 DIAGNOSIS — E119 Type 2 diabetes mellitus without complications: Secondary | ICD-10-CM | POA: Diagnosis not present

## 2015-01-11 DIAGNOSIS — Z5181 Encounter for therapeutic drug level monitoring: Secondary | ICD-10-CM

## 2015-01-11 DIAGNOSIS — G4761 Periodic limb movement disorder: Secondary | ICD-10-CM | POA: Insufficient documentation

## 2015-01-11 DIAGNOSIS — M810 Age-related osteoporosis without current pathological fracture: Secondary | ICD-10-CM | POA: Diagnosis not present

## 2015-01-11 DIAGNOSIS — G4733 Obstructive sleep apnea (adult) (pediatric): Secondary | ICD-10-CM | POA: Diagnosis not present

## 2015-01-11 DIAGNOSIS — E041 Nontoxic single thyroid nodule: Secondary | ICD-10-CM

## 2015-01-11 DIAGNOSIS — Z1159 Encounter for screening for other viral diseases: Secondary | ICD-10-CM | POA: Diagnosis not present

## 2015-01-11 DIAGNOSIS — I10 Essential (primary) hypertension: Secondary | ICD-10-CM

## 2015-01-11 DIAGNOSIS — G478 Other sleep disorders: Secondary | ICD-10-CM | POA: Diagnosis not present

## 2015-01-11 DIAGNOSIS — Z720 Tobacco use: Secondary | ICD-10-CM

## 2015-01-11 DIAGNOSIS — E785 Hyperlipidemia, unspecified: Secondary | ICD-10-CM

## 2015-01-11 DIAGNOSIS — R7989 Other specified abnormal findings of blood chemistry: Secondary | ICD-10-CM | POA: Insufficient documentation

## 2015-01-11 LAB — COMPREHENSIVE METABOLIC PANEL
ALBUMIN: 4.1 g/dL (ref 3.6–5.1)
ALT: 11 U/L (ref 6–29)
AST: 14 U/L (ref 10–35)
Alkaline Phosphatase: 77 U/L (ref 33–130)
BILIRUBIN TOTAL: 0.8 mg/dL (ref 0.2–1.2)
BUN: 10 mg/dL (ref 7–25)
CALCIUM: 9.2 mg/dL (ref 8.6–10.4)
CHLORIDE: 105 mmol/L (ref 98–110)
CO2: 26 mmol/L (ref 20–31)
Creat: 0.68 mg/dL (ref 0.50–0.99)
GLUCOSE: 91 mg/dL (ref 65–99)
POTASSIUM: 4.4 mmol/L (ref 3.5–5.3)
Sodium: 140 mmol/L (ref 135–146)
Total Protein: 6.5 g/dL (ref 6.1–8.1)

## 2015-01-11 LAB — MICROALBUMIN / CREATININE URINE RATIO
CREATININE, URINE: 178.8 mg/dL
MICROALB/CREAT RATIO: 3.4 mg/g (ref 0.0–30.0)
Microalb, Ur: 0.6 mg/dL (ref <2.0)

## 2015-01-11 LAB — LIPID PANEL
CHOLESTEROL: 205 mg/dL — AB (ref 125–200)
HDL: 64 mg/dL (ref >=46)
LDL Cholesterol: 121 mg/dL (ref <130)
Total CHOL/HDL Ratio: 3.2 Ratio (ref <=5.0)
Triglycerides: 100 mg/dL (ref <150)
VLDL: 20 mg/dL (ref <30)

## 2015-01-11 LAB — THYROID PANEL WITH TSH
Free Thyroxine Index: 2.1 (ref 1.4–3.8)
T3 Uptake: 30 % (ref 22–35)
T4 TOTAL: 7 ug/dL (ref 4.5–12.0)
TSH: 0.266 u[IU]/mL — AB (ref 0.350–4.500)

## 2015-01-11 LAB — POCT GLYCOSYLATED HEMOGLOBIN (HGB A1C): Hemoglobin A1C: 5.8

## 2015-01-11 MED ORDER — ATORVASTATIN CALCIUM 40 MG PO TABS: 40.0000 mg | ORAL_TABLET | Freq: Every day | ORAL | Status: DC

## 2015-01-11 MED ORDER — FLUTICASONE PROPIONATE 50 MCG/ACT NA SUSP: 2.0000 | Freq: Every day | NASAL | Status: DC

## 2015-01-12 ENCOUNTER — Ambulatory Visit: Payer: PPO | Admitting: Family Medicine

## 2015-01-12 LAB — HEPATITIS C ANTIBODY: HCV AB: NEGATIVE

## 2015-05-08 ENCOUNTER — Ambulatory Visit (INDEPENDENT_AMBULATORY_CARE_PROVIDER_SITE_OTHER): Payer: PPO | Admitting: Emergency Medicine

## 2015-05-08 ENCOUNTER — Ambulatory Visit (INDEPENDENT_AMBULATORY_CARE_PROVIDER_SITE_OTHER): Payer: PPO

## 2015-05-08 VITALS — BP 124/86 | HR 73 | Temp 97.8°F | Resp 16 | Ht 64.5 in | Wt 121.0 lb

## 2015-05-08 DIAGNOSIS — R1032 Left lower quadrant pain: Secondary | ICD-10-CM | POA: Diagnosis not present

## 2015-05-08 DIAGNOSIS — R1031 Right lower quadrant pain: Secondary | ICD-10-CM | POA: Diagnosis not present

## 2015-05-08 DIAGNOSIS — K5900 Constipation, unspecified: Secondary | ICD-10-CM

## 2015-05-08 LAB — POC MICROSCOPIC URINALYSIS (UMFC)

## 2015-05-08 LAB — POCT CBC
GRANULOCYTE PERCENT: 68.3 % (ref 37–80)
HCT, POC: 42.4 % (ref 37.7–47.9)
Hemoglobin: 14.7 g/dL (ref 12.2–16.2)
Lymph, poc: 2.5 (ref 0.6–3.4)
MCH: 32.7 pg — AB (ref 27–31.2)
MCHC: 34.7 g/dL (ref 31.8–35.4)
MCV: 94.2 fL (ref 80–97)
MID (CBC): 0.3 (ref 0–0.9)
MPV: 7.2 fL (ref 0–99.8)
PLATELET COUNT, POC: 275 10*3/uL (ref 142–424)
POC GRANULOCYTE: 6 (ref 2–6.9)
POC LYMPH %: 28.2 % (ref 10–50)
POC MID %: 3.5 %M (ref 0–12)
RBC: 4.5 M/uL (ref 4.04–5.48)
RDW, POC: 13.5 %
WBC: 8.8 10*3/uL (ref 4.6–10.2)

## 2015-05-08 LAB — POCT URINALYSIS DIP (MANUAL ENTRY)
BILIRUBIN UA: NEGATIVE
BILIRUBIN UA: NEGATIVE
Blood, UA: NEGATIVE
Leukocytes, UA: NEGATIVE
Nitrite, UA: NEGATIVE
Protein Ur, POC: NEGATIVE
SPEC GRAV UA: 1.02
Urobilinogen, UA: 1
pH, UA: 6

## 2015-05-08 NOTE — Progress Notes (Signed)
Subjective:  Patient ID: Terry Armstrong, female    DOB: March 20, 1946  Age: 70 y.o. MRN: [id number]  CC: Groin Pain   HPI Terry Armstrong presents   Patient has generalized abdominal pain mostly in the right lower quadrant. She has no nausea vomiting or stool change. In her stool or black stool. She has no fever or chills. Her appetite is normal. She has no food intolerance. She has no excess use of nonsteroidals or aspirin. She's had TAH/BSO. She has no vaginal discharge or bleeding dysuria urgency or frequency.  History Terry Armstrong has a past medical history of Diabetes mellitus without complication ([place]); Breast abscess; Hyperlipidemia; and Primary snoring.   She has past surgical history that includes Abdominal hysterectomy (1993).   Her  family history includes Cancer in her maternal grandmother; Heart Problems in her maternal grandmother; Heart disease in her sister; Hypertension in her maternal aunt and sister; Kidney failure in her maternal aunt.  She   reports that she has been smoking Cigarettes.  She has a 28 pack-year smoking history. She has never used smokeless tobacco. She reports that she drinks alcohol. She reports that she does not use illicit drugs.  Outpatient Prescriptions Prior to Visit  Medication Sig Dispense Refill  . atorvastatin (LIPITOR) 40 MG tablet Take 1 tablet (40 mg total) by mouth daily. 90 tablet 3  . fluticasone (FLONASE) 50 MCG/ACT nasal spray Place 2 sprays into both nostrils at bedtime. 16 g 11  . multivitamin-iron-minerals-folic acid (CENTRUM) chewable tablet Chew 1 tablet by mouth daily.    . Omega-3 Fatty Acids (FISH OIL) 1200 MG CAPS Take 1 capsule by mouth daily.    Marland Kitchen alendronate (FOSAMAX) 70 MG tablet Take 1 tablet (70 mg total) by mouth once a week. Take with a full glass of water on an empty stomach. (Patient not taking: Reported on 05/08/2015) 12 tablet 3   No facility-administered medications prior to visit.    Social History   Social History    . Marital Status: Single    Spouse Name: N/A  . Number of Children: 0  . Years of Education: 14   Occupational History  .     Social History Main Topics  . Smoking status: Current Every Day Smoker -- 1.00 packs/day for 28 years    Types: Cigarettes  . Smokeless tobacco: Never Used  . Alcohol Use: Yes     Comment: social  . Drug Use: No  . Sexual Activity: Not Asked   Other Topics Concern  . None   Social History Narrative   Patient lives with her significant other.   Patient is retired.   Patient drinks 2-3 cups of caffeine daily in the winter.   Patient has a college education.   Patient is right-handed.           Review of Systems  Constitutional: Negative for fever, chills and appetite change.  HENT: Negative for congestion, ear pain, postnasal drip, sinus pressure and sore throat.   Eyes: Negative for pain and redness.  Respiratory: Negative for cough, shortness of breath and wheezing.   Cardiovascular: Negative for leg swelling.  Gastrointestinal: Positive for abdominal pain (RLQ  pain). Negative for nausea, vomiting, diarrhea, constipation and blood in stool.  Endocrine: Negative for polyuria.  Genitourinary: Negative for dysuria, urgency, frequency and flank pain.  Musculoskeletal: Negative for gait problem.  Skin: Negative for rash.  Neurological: Negative for weakness and headaches.  Psychiatric/Behavioral: Negative for confusion and decreased concentration. The patient is not  nervous/anxious.     Objective:  BP 124/86 mmHg  Pulse 73  Temp(Src) 97.8 F (36.6 C) (Oral)  Resp 16  Ht 5' 4.5" (1.638 m)  Wt 121 lb (54.885 kg)  BMI 20.46 kg/m2  SpO2 98%  Physical Exam  Constitutional: She is oriented to person, place, and time. She appears well-developed and well-nourished. No distress.  HENT:  Head: Normocephalic and atraumatic.  Right Ear: External ear normal.  Left Ear: External ear normal.  Nose: Nose normal.  Eyes: Conjunctivae and EOM are  normal. Pupils are equal, round, and reactive to light. No scleral icterus.  Neck: Normal range of motion. Neck supple. No tracheal deviation present.  Cardiovascular: Normal rate, regular rhythm and normal heart sounds.   Pulmonary/Chest: Effort normal. No respiratory distress. She has no wheezes. She has no rales.  Abdominal: She exhibits no mass. There is tenderness in the right lower quadrant. There is no rebound and no guarding.  Musculoskeletal: She exhibits no edema.  Lymphadenopathy:    She has no cervical adenopathy.  Neurological: She is alert and oriented to person, place, and time. Coordination normal.  Skin: Skin is warm and dry. No rash noted.  Psychiatric: She has a normal mood and affect. Her behavior is normal.      Assessment & Plan:   Terry Armstrong was seen today for groin pain.  Diagnoses and all orders for this visit:  Abdominal pain, right lower quadrant -     POCT CBC -     POCT urinalysis dipstick -     POCT Microscopic Urinalysis ([facility]) -     DG Abd Acute W/Chest; Future  Constipation, unspecified constipation type  I am having Terry Armstrong maintain her multivitamin-iron-minerals-folic acid, Fish Oil, alendronate, atorvastatin, fluticasone, and metFORMIN.  Meds ordered this encounter  Medications  . metFORMIN (GLUCOPHAGE) 500 MG tablet    Sig: Take by mouth 1 day or 1 dose.    Appropriate red flag conditions were discussed with the patient as well as actions that should be taken.  Patient expressed his understanding.  Follow-up: Return if symptoms worsen or fail to improve.  Roselee Culver, MD  [facility] reading (PRIMARY) by  Dr. Ouida Sills.  Increased stool pattern.    Results for orders placed or performed in visit on 05/08/15  POCT CBC  Result Value Ref Range   WBC 8.8 4.6 - 10.2 K/uL   Lymph, poc 2.5 0.6 - 3.4   POC LYMPH PERCENT 28.2 10 - 50 %L   MID (cbc) 0.3 0 - 0.9   POC MID % 3.5 0 - 12 %M   POC Granulocyte 6.0 2 - 6.9   Granulocyte  percent 68.3 37 - 80 %G   RBC 4.50 4.04 - 5.48 M/uL   Hemoglobin 14.7 12.2 - 16.2 g/dL   HCT, POC 42.4 37.7 - 47.9 %   MCV 94.2 80 - 97 fL   MCH, POC 32.7 (A) 27 - 31.2 pg   MCHC 34.7 31.8 - 35.4 g/dL   RDW, POC 13.5 %   Platelet Count, POC 275 142 - 424 K/uL   MPV 7.2 0 - 99.8 fL  POCT urinalysis dipstick  Result Value Ref Range   Color, UA yellow yellow   Clarity, UA clear clear   Glucose, UA =100 (A) negative   Bilirubin, UA negative negative   Ketones, POC UA negative negative   Spec Grav, UA 1.020    Blood, UA negative negative   pH, UA 6.0  Protein Ur, POC negative negative   Urobilinogen, UA 1.0    Nitrite, UA Negative Negative   Leukocytes, UA Negative Negative  POCT Microscopic Urinalysis ([facility])  Result Value Ref Range   WBC,UR,HPF,POC None None WBC/hpf   RBC,UR,HPF,POC Few (A) None RBC/hpf   Bacteria None None, Too numerous to count   Mucus Present (A) Absent   Epithelial Cells, UR Per Microscopy Few (A) None, Too numerous to count cells/hpf

## 2015-05-08 NOTE — Patient Instructions (Signed)

## 2015-05-14 ENCOUNTER — Telehealth: Payer: Self-pay

## 2015-05-14 NOTE — Telephone Encounter (Signed)
Constipation, unspecified constipation type  I am having Ms. Terry Armstrong maintain her multivitamin-iron-minerals-folic acid, Fish Oil, alendronate, atorvastatin, fluticasone, and metFORMIN.  Can we send in something for pt? SHe was seen on 05/08/15

## 2015-05-14 NOTE — Telephone Encounter (Signed)
PT would like something called in for constipation.// no allergies// prefers [facility] on [address] [facility]  [phone]

## 2015-05-14 NOTE — Telephone Encounter (Signed)
Please tell pt she can buy miralax over the counter, this will help her constipated. Tell her to dissolve 1 capful in liquid twice a day until her stools are loose and then take 1 capful per day for 1 week to prevent constipation from recurring.

## 2015-05-15 NOTE — Telephone Encounter (Signed)
SPoke with pt, advised message from [place].

## 2015-05-22 ENCOUNTER — Other Ambulatory Visit: Payer: Self-pay | Admitting: Family Medicine

## 2015-05-23 ENCOUNTER — Other Ambulatory Visit: Payer: Self-pay | Admitting: Family Medicine

## 2015-06-15 ENCOUNTER — Telehealth: Payer: Self-pay | Admitting: Family Medicine

## 2015-06-15 NOTE — Telephone Encounter (Signed)
lmom that patient appointment has been cancelled with Brigitte Pulse on 07-19-2015 please to reschedule

## 2015-07-19 ENCOUNTER — Encounter: Payer: Self-pay | Admitting: Family Medicine

## 2015-07-26 ENCOUNTER — Ambulatory Visit (INDEPENDENT_AMBULATORY_CARE_PROVIDER_SITE_OTHER): Payer: PPO | Admitting: Family Medicine

## 2015-07-26 ENCOUNTER — Encounter: Payer: Self-pay | Admitting: Family Medicine

## 2015-07-26 VITALS — BP 171/81 | HR 66 | Temp 98.1°F | Resp 16 | Ht 65.0 in | Wt 121.0 lb

## 2015-07-26 DIAGNOSIS — E119 Type 2 diabetes mellitus without complications: Secondary | ICD-10-CM

## 2015-07-26 DIAGNOSIS — Z130 Encounter for screening for diseases of the blood and blood-forming organs and certain disorders involving the immune mechanism: Secondary | ICD-10-CM | POA: Diagnosis not present

## 2015-07-26 DIAGNOSIS — Z1389 Encounter for screening for other disorder: Secondary | ICD-10-CM

## 2015-07-26 DIAGNOSIS — Z0000 Encounter for general adult medical examination without abnormal findings: Secondary | ICD-10-CM

## 2015-07-26 DIAGNOSIS — Z23 Encounter for immunization: Secondary | ICD-10-CM

## 2015-07-26 DIAGNOSIS — Z1239 Encounter for other screening for malignant neoplasm of breast: Secondary | ICD-10-CM

## 2015-07-26 DIAGNOSIS — G4733 Obstructive sleep apnea (adult) (pediatric): Secondary | ICD-10-CM

## 2015-07-26 DIAGNOSIS — I10 Essential (primary) hypertension: Secondary | ICD-10-CM

## 2015-07-26 DIAGNOSIS — R7989 Other specified abnormal findings of blood chemistry: Secondary | ICD-10-CM | POA: Diagnosis not present

## 2015-07-26 DIAGNOSIS — Z720 Tobacco use: Secondary | ICD-10-CM | POA: Diagnosis not present

## 2015-07-26 DIAGNOSIS — Z136 Encounter for screening for cardiovascular disorders: Secondary | ICD-10-CM | POA: Diagnosis not present

## 2015-07-26 DIAGNOSIS — E785 Hyperlipidemia, unspecified: Secondary | ICD-10-CM | POA: Diagnosis not present

## 2015-07-26 DIAGNOSIS — E041 Nontoxic single thyroid nodule: Secondary | ICD-10-CM

## 2015-07-26 DIAGNOSIS — Z1383 Encounter for screening for respiratory disorder NEC: Secondary | ICD-10-CM | POA: Diagnosis not present

## 2015-07-26 DIAGNOSIS — Z1212 Encounter for screening for malignant neoplasm of rectum: Secondary | ICD-10-CM

## 2015-07-26 DIAGNOSIS — Z1211 Encounter for screening for malignant neoplasm of colon: Secondary | ICD-10-CM | POA: Diagnosis not present

## 2015-07-26 DIAGNOSIS — M810 Age-related osteoporosis without current pathological fracture: Secondary | ICD-10-CM | POA: Diagnosis not present

## 2015-07-26 DIAGNOSIS — R799 Abnormal finding of blood chemistry, unspecified: Secondary | ICD-10-CM | POA: Diagnosis not present

## 2015-07-26 LAB — COMPREHENSIVE METABOLIC PANEL
ALT: 10 U/L (ref 6–29)
AST: 11 U/L (ref 10–35)
Albumin: 4.1 g/dL (ref 3.6–5.1)
Alkaline Phosphatase: 65 U/L (ref 33–130)
BUN: 10 mg/dL (ref 7–25)
CHLORIDE: 106 mmol/L (ref 98–110)
CO2: 25 mmol/L (ref 20–31)
CREATININE: 0.68 mg/dL (ref 0.60–0.93)
Calcium: 9.4 mg/dL (ref 8.6–10.4)
GLUCOSE: 113 mg/dL — AB (ref 65–99)
POTASSIUM: 4.4 mmol/L (ref 3.5–5.3)
SODIUM: 140 mmol/L (ref 135–146)
TOTAL PROTEIN: 6.8 g/dL (ref 6.1–8.1)
Total Bilirubin: 0.7 mg/dL (ref 0.2–1.2)

## 2015-07-26 LAB — POCT URINALYSIS DIP (MANUAL ENTRY)
BILIRUBIN UA: NEGATIVE
BILIRUBIN UA: NEGATIVE
GLUCOSE UA: NEGATIVE
Leukocytes, UA: NEGATIVE
Nitrite, UA: NEGATIVE
Protein Ur, POC: NEGATIVE
RBC UA: NEGATIVE
SPEC GRAV UA: 1.02
Urobilinogen, UA: 1
pH, UA: 6.5

## 2015-07-26 LAB — THYROID PANEL WITH TSH
FREE THYROXINE INDEX: 2.3 (ref 1.4–3.8)
T3 UPTAKE: 32 % (ref 22–35)
T4 TOTAL: 7.3 ug/dL (ref 4.5–12.0)
TSH: 0.25 mIU/L — ABNORMAL LOW

## 2015-07-26 LAB — LIPID PANEL
CHOL/HDL RATIO: 3.7 ratio (ref <=5.0)
CHOLESTEROL: 243 mg/dL — AB (ref 125–200)
HDL: 65 mg/dL (ref >=46)
LDL CALC: 148 mg/dL — AB (ref <130)
Triglycerides: 150 mg/dL — ABNORMAL HIGH (ref <150)
VLDL: 30 mg/dL (ref <30)

## 2015-07-26 LAB — HEMOGLOBIN A1C
HEMOGLOBIN A1C: 6.1 % — AB (ref <5.7)
Mean Plasma Glucose: 128 mg/dL — ABNORMAL HIGH (ref <117)

## 2015-07-26 NOTE — Patient Instructions (Signed)
[facility] is now offering annual lung cancer screening by low-dose CT scan.  This is covered for qualifying patients and your insurance will be checked before the procedure.  Call the lung cancer screening nurse navigators at [phone] to learn more about this and get scheduled.  

## 2015-07-26 NOTE — Progress Notes (Signed)
Subjective:    Terry Armstrong is a 70 y.o. female who presents for Medicare Annual/Subsequent preventive examination.  Preventive Screening-Counseling & Management  Tobacco History  Smoking status  . Current Every Day Smoker -- 1.00 packs/day for 28 years  . Types: Cigarettes  Smokeless tobacco  . Never Used     Problems Prior to Visit 1. HTN: well controlled, not checking outside the office 2.  Type 2 DM: was able to wean off of metformin and last a1c was down to 5.8 through tlc. Has not seen Dr. Katy Fitch 3.  HPL:  Last LDL 121 so not at goal for <100 so was working on tlc. On atorvastatin 4.  History of right thyroid nodule and abnormal tsh. - cannot find prior imaging where theis was found so will repeat US. Abnormal TSH - suppressed but t3/t4 levels nml and pt without sig sxs. Reviewed sxs and risks of hyperthyroidism as well as various treatment options but pt asymptomatic and labs stable so will continue watchful waiting - recheck every 6 mos 5.  Osteoporosis - due for repeat dexa at [facility], on fosamax but forgot to take it for a while  Current Problems (verified) Patient Active Problem List   Diagnosis Date Noted  . Periodic limb movement sleep disorder 01/11/2015  . Abnormal TSH 01/11/2015  . Upper airway resistance syndrome 01/11/2015  . Mild obstructive sleep apnea 01/11/2015  . Osteoporosis 01/10/2015  . Snoring 05/23/2013  . Non-restorative sleep 05/23/2013  . Primary snoring   . HTN (hypertension) 05/11/2013  . Left breast abscess 11/30/2012  . DM (diabetes mellitus) ([place]) 06/04/2011  . Tobacco user 06/04/2011  . Hyperlipidemia with target LDL less than 100 06/04/2011    Medications Prior to Visit Current Outpatient Prescriptions on File Prior to Visit  Medication Sig Dispense Refill  . alendronate (FOSAMAX) 70 MG tablet Take 1 tablet (70 mg total) by mouth once a week. Take with a full glass of water on an empty stomach. (Patient not taking: Reported on 05/08/2015)  12 tablet 3  . atorvastatin (LIPITOR) 40 MG tablet Take 1 tablet (40 mg total) by mouth daily. 90 tablet 3  . fluticasone (FLONASE) 50 MCG/ACT nasal spray Place 2 sprays into both nostrils at bedtime. 16 g 11  . metFORMIN (GLUCOPHAGE) 500 MG tablet Take by mouth 1 day or 1 dose.    . multivitamin-iron-minerals-folic acid (CENTRUM) chewable tablet Chew 1 tablet by mouth daily.    . Omega-3 Fatty Acids (FISH OIL) 1200 MG CAPS Take 1 capsule by mouth daily.    . pravastatin (PRAVACHOL) 40 MG tablet TAKE ONE TABLET BY MOUTH ONCE DAILY 30 tablet 0   No current facility-administered medications on file prior to visit.    Current Medications (verified) Current Outpatient Prescriptions  Medication Sig Dispense Refill  . alendronate (FOSAMAX) 70 MG tablet Take 1 tablet (70 mg total) by mouth once a week. Take with a full glass of water on an empty stomach. (Patient not taking: Reported on 05/08/2015) 12 tablet 3  . atorvastatin (LIPITOR) 40 MG tablet Take 1 tablet (40 mg total) by mouth daily. 90 tablet 3  . fluticasone (FLONASE) 50 MCG/ACT nasal spray Place 2 sprays into both nostrils at bedtime. 16 g 11  . metFORMIN (GLUCOPHAGE) 500 MG tablet Take by mouth 1 day or 1 dose.    . multivitamin-iron-minerals-folic acid (CENTRUM) chewable tablet Chew 1 tablet by mouth daily.    . Omega-3 Fatty Acids (FISH OIL) 1200 MG CAPS Take 1 capsule  by mouth daily.    . pravastatin (PRAVACHOL) 40 MG tablet TAKE ONE TABLET BY MOUTH ONCE DAILY 30 tablet 0   No current facility-administered medications for this visit.   taking centrum but not taking extra calcium vitamin D.  Allergies (verified) Aspirin   PAST HISTORY  Family History Family History  Problem Relation Age of Onset  . Diabetes    . Hypertension Sister   . Hyperlipidemia    . Heart disease Sister     defibrillator  . Kidney failure Maternal Aunt     dialysis  . Hypertension Maternal Aunt   . Heart Problems Maternal Grandmother   . Cancer  Maternal Grandmother     Social History Social History  Substance Use Topics  . Smoking status: Current Every Day Smoker -- 1.00 packs/day for 28 years    Types: Cigarettes  . Smokeless tobacco: Never Used  . Alcohol Use: Yes     Comment: social     Are there smokers in your home (other than you)? Yes -  Husbands smokes as well but they both have cut down to 1/2 ppd - using nicotine lozenges and stopping smoking in house and in car  Risk Factors Current exercise habits: Home exercise routine includes housework, caring for several people.  Dietary issues discussed: eats normally - tries to get in 3 meals/d, no junk food snacks.  Husband is on insulin diabetic so often pt follows diabetic diet as well.   Cardiac risk factors: advanced age (older than 70 for men, 59 for women), dyslipidemia, hypertension and smoking/ tobacco exposure.  Depression Screen Depression screen [facility] 2/9 07/26/2015 05/08/2015 07/28/2014 09/16/2013 07/08/2013  Decreased Interest 0 0 0 0 0  Down, Depressed, Hopeless 0 0 0 0 2  PHQ - 2 Score 0 0 0 0 2  Altered sleeping - - - - 0  Tired, decreased energy - - - - 0  Change in appetite - - - - 0  Feeling bad or failure about yourself  - - - - 0  Trouble concentrating - - - - 0  Moving slowly or fidgety/restless - - - - 0  Suicidal thoughts - - - - 0  PHQ-9 Score - - - - 2    Activities of Daily Living In your present state of health, do you have any difficulty performing the following activities?:  Driving? No Managing money?  No Feeding yourself? No Getting from bed to chair? No Climbing a flight of stairs? No Preparing food and eating?: No Bathing or showering? No - husband has shower chair and has bar in shower Getting dressed: No Getting to the toilet? No Using the toilet:No Moving around from place to place: No In the past year have you fallen or had a near fall?:No - not since fractured her foot last yr   Are you sexually active?  Yes  Do you have  more than one partner?  No  Hearing Difficulties: No Do you often ask people to speak up or repeat themselves? No Do you experience ringing or noises in your ears? No Do you have difficulty understanding soft or whispered voices? No   Do you feel that you have a problem with memory? No  Do you often misplace items? No  Do you feel safe at home?  Yes  Cognitive Testing  Alert? Yes  Normal Appearance?Yes  Oriented to person? Yes  Place? Yes   Time? Yes  Recall of three objects?  Yes  Can perform  simple calculations? Yes  Displays appropriate judgment?Yes  Can read the correct time from a watch face?Yes   Advanced Directives have been discussed with the patient? Yes does not have HCPOA - don't put her on life support but would be ok if short term with good prognosis. She has discussed this with her husband in detail who she would like to be her HCPOA which would defer to her sister if her husband is unable.   Visual Acuity Screening   Right eye Left eye Both eyes  Without correction: 20/40 20/40 20/25   With correction:       List the Names of Other Physician/Practitioners you currently use: 1.  Optho - Dr. Katy Fitch: Seen (last we have was 08/2013) - needs to make an appt  Indicate any recent Medical Services you may have received from other than [facility] providers in the past year (date may be approximate).  Immunization History  Administered Date(s) Administered  . Influenza Split 01/28/2012  . Influenza,inj,Quad PF,36+ Mos 04/08/2013, 04/15/2014, 01/11/2015  . Pneumococcal Polysaccharide-23 11/14/2010  . Tdap 10/11/2008    Screening Tests Health Maintenance  Topic Date Due  . OPHTHALMOLOGY EXAM  08/31/2014  . HEMOGLOBIN A1C  07/11/2015  . MAMMOGRAM  08/25/2015  . INFLUENZA VACCINE  12/04/2015  . FOOT EXAM  01/11/2016  . URINE MICROALBUMIN  01/11/2016  . TETANUS/TDAP  10/12/2018  . COLONOSCOPY  12/20/2018  . DEXA SCAN  Completed  . ZOSTAVAX  Completed  . Hepatitis C  Screening  Completed  . PNA vac Low Risk Adult  Completed    All answers were reviewed with the patient and necessary referrals were made:  SHAW,EVA, MD   07/26/2015   History reviewed: allergies, current medications, past family history, past medical history, past social history, past surgical history and problem list  Review of Systems Pertinent items noted in HPI and remainder of comprehensive ROS otherwise negative.    Objective:  BP 171/81 mmHg  Pulse 66  Temp(Src) 98.1 F (36.7 C)  Resp 16  Ht 5\' 5"  (1.651 m)  Wt 121 lb (54.885 kg)  BMI 20.14 kg/m2  Visual Acuity Screening   Right eye Left eye Both eyes  Without correction: 20/40 20/40 20/25   With correction:       BP 171/81 mmHg  Pulse 66  Temp(Src) 98.1 F (36.7 C)  Resp 16  Ht 5\' 5"  (1.651 m)  Wt 121 lb (54.885 kg)  BMI 20.14 kg/m2  General Appearance:    Alert, cooperative, no distress, appears stated age  Head:    Normocephalic, without obvious abnormality, atraumatic  Eyes:    PERRL, conjunctiva/corneas clear, EOM's intact, fundi    benign, both eyes  Ears:    Normal TM's and external ear canals, both ears  Nose:   Nares normal, septum midline, mucosa normal, no drainage    or sinus tenderness  Throat:   Lips, mucosa, and tongue normal; teeth and gums normal  Neck:   Supple, symmetrical, trachea midline, no adenopathy;    thyroid:  no enlargement/tenderness/nodules; no carotid   bruit or JVD  Back:     Symmetric, no curvature, ROM normal, no CVA tenderness  Lungs:     Clear to auscultation bilaterally, respirations unlabored  Chest Wall:    No tenderness or deformity   Heart:    Regular rate and rhythm, S1 and S2 normal, no murmur, rub   or gallop  Breast Exam:    No tenderness, masses, or nipple abnormality  Abdomen:  Soft, non-tender, bowel sounds active all four quadrants,    no masses, no organomegaly        Extremities:   Extremities normal, atraumatic, no cyanosis or edema  Pulses:   2+ and  symmetric all extremities  Skin:   Skin color, texture, turgor normal, no rashes or lesions  Lymph nodes:   Cervical, supraclavicular, and axillary nodes normal  Neurologic:   CNII-XII intact, normal strength, sensation and reflexes    throughout       Assessment:     1. Medicare annual wellness visit, subsequent   2. Screening for breast cancer   3. Screening for cardiovascular, respiratory, and genitourinary diseases   4. Screening for colorectal cancer   5. Screening for deficiency anemia   6. Abnormal TSH   7. Hyperlipidemia with target LDL less than 100 - not at goal so restart atorvastatin 40  8. Tobacco user   9. Osteoporosis   10. Type 2 diabetes mellitus without complication, without long-term current use of insulin ([place])   11. Essential hypertension   12. Mild obstructive sleep apnea   13. Need for prophylactic vaccination against Streptococcus pneumoniae (pneumococcus)   14. Thyroid nodule         Plan:     During the course of the visit the patient was educated and counseled about appropriate screening and preventive services including:    Pneumococcal vaccine   Influenza vaccine - done this season  Td vaccine - done 2010  Zostavax done 2016  Screening electrocardiogram  Screening mammography - needs repeat  Screening Pap smear and pelvic exam   Bone densitometry screening - ordered dexa at [place] since last was 08/2013.  04/2013 vit D nml at 66  Colorectal cancer screening - done 12/19/2008 with small polyp removed from transverse colon  Diabetes screening  Glaucoma screening  Nutrition counseling   Smoking cessation counseling  Advanced directives: HCPOA??  Needs prevnar, a1c, cmp, lipid, full thyroid panel tob use? -> screening lung CT Repeat thyroid US to reassess right thyroid nodule dexa at [facility] for osteoporisis along with mammogram  Diet review for nutrition referral? Yes ____  Not Indicated ____   Patient Instructions (the  written plan) was given to the patient.  Medicare Attestation I have personally reviewed: The patient's medical and social history Their use of alcohol, tobacco or illicit drugs Their current medications and supplements The patient's functional ability including ADLs,fall risks, home safety risks, cognitive, and hearing and visual impairment Diet and physical activities Evidence for depression or mood disorders  The patient's weight, height, BMI, and visual acuity have been recorded in the chart.  I have made referrals, counseling, and provided education to the patient based on review of the above and I have provided the patient with a written personalized care plan for preventive services.     SHAW,EVA, MD   07/26/2015    NEEDS ATROVASTATIN SENT IN AFTER CHOL RESULTS COME BACK   Orders Placed This Encounter  Procedures  . US Soft Tissue Head/Neck    120 LBS/NO NEEDS/INS/HUMANA/RLC/PT W/EPIC ORDER    Standing Status: Future     Number of Occurrences:      Standing Expiration Date: 09/24/2016    Order Specific Question:  Reason for Exam (SYMPTOM  OR DIAGNOSIS REQUIRED)    Answer:  f/u right thyroid nodule    Order Specific Question:  Preferred imaging location?    Answer:  External  . MM Digital Screening    Standing Status: Future  Number of Occurrences:      Standing Expiration Date: 09/24/2016    Scheduling Instructions:     [facility]    Order Specific Question:  Reason for Exam (SYMPTOM  OR DIAGNOSIS REQUIRED)    Answer:  screening    Order Specific Question:  Preferred imaging location?    Answer:  External  . DG Bone Density    Standing Status: Future     Number of Occurrences:      Standing Expiration Date: 09/24/2016    Scheduling Instructions:     [facility]    Order Specific Question:  Reason for Exam (SYMPTOM  OR DIAGNOSIS REQUIRED)    Answer:  f/u osteoporosis    Order Specific Question:  Preferred imaging location?    Answer:  External  . Pneumococcal conjugate  vaccine 13-valent IM  . Hemoglobin A1c  . Comprehensive metabolic panel    Order Specific Question:  Has the patient fasted?    Answer:  Yes  . Lipid panel    Order Specific Question:  Has the patient fasted?    Answer:  Yes  . Thyroid Panel With TSH  . POCT urinalysis dipstick     Delman Cheadle, MD MPH  Results for orders placed or performed in visit on 07/26/15  Hemoglobin A1c  Result Value Ref Range   Hgb A1c MFr Bld 6.1 (H) <5.7 %   Mean Plasma Glucose 128 (H) <117 mg/dL  Comprehensive metabolic panel  Result Value Ref Range   Sodium 140 135 - 146 mmol/L   Potassium 4.4 3.5 - 5.3 mmol/L   Chloride 106 98 - 110 mmol/L   CO2 25 20 - 31 mmol/L   Glucose, Bld 113 (H) 65 - 99 mg/dL   BUN 10 7 - 25 mg/dL   Creat 0.68 0.60 - 0.93 mg/dL   Total Bilirubin 0.7 0.2 - 1.2 mg/dL   Alkaline Phosphatase 65 33 - 130 U/L   AST 11 10 - 35 U/L   ALT 10 6 - 29 U/L   Total Protein 6.8 6.1 - 8.1 g/dL   Albumin 4.1 3.6 - 5.1 g/dL   Calcium 9.4 8.6 - 10.4 mg/dL  Lipid panel  Result Value Ref Range   Cholesterol 243 (H) 125 - 200 mg/dL   Triglycerides 150 (H) <150 mg/dL   HDL 65 >=46 mg/dL   Total CHOL/HDL Ratio 3.7 <=5.0 Ratio   VLDL 30 <30 mg/dL   LDL Cholesterol 148 (H) <130 mg/dL  Thyroid Panel With TSH  Result Value Ref Range   T4, Total 7.3 4.5 - 12.0 ug/dL   T3 Uptake 32 22 - 35 %   Free Thyroxine Index 2.3 1.4 - 3.8   TSH 0.25 (L) mIU/L  POCT urinalysis dipstick  Result Value Ref Range   Color, UA yellow yellow   Clarity, UA clear clear   Glucose, UA negative negative   Bilirubin, UA negative negative   Ketones, POC UA negative negative   Spec Grav, UA 1.020    Blood, UA negative negative   pH, UA 6.5    Protein Ur, POC negative negative   Urobilinogen, UA 1.0    Nitrite, UA Negative Negative   Leukocytes, UA Negative Negative

## 2015-08-01 ENCOUNTER — Other Ambulatory Visit: Payer: PRIVATE HEALTH INSURANCE

## 2015-08-07 MED ORDER — ATORVASTATIN CALCIUM 40 MG PO TABS: 40.0000 mg | ORAL_TABLET | Freq: Every day | ORAL | Status: DC

## 2015-08-09 ENCOUNTER — Ambulatory Visit
Admission: RE | Admit: 2015-08-09 | Discharge: 2015-08-09 | Disposition: A | Discharge status: Home / Self Care | Payer: PPO | Source: Ambulatory Visit | Attending: Family Medicine | Admitting: Family Medicine

## 2015-08-09 DIAGNOSIS — R7989 Other specified abnormal findings of blood chemistry: Secondary | ICD-10-CM

## 2015-08-09 DIAGNOSIS — E042 Nontoxic multinodular goiter: Secondary | ICD-10-CM | POA: Diagnosis not present

## 2015-08-09 DIAGNOSIS — E041 Nontoxic single thyroid nodule: Secondary | ICD-10-CM

## 2015-08-09 DIAGNOSIS — Z0000 Encounter for general adult medical examination without abnormal findings: Secondary | ICD-10-CM

## 2015-08-10 ENCOUNTER — Other Ambulatory Visit: Payer: Self-pay | Admitting: Family Medicine

## 2015-08-10 DIAGNOSIS — R7989 Other specified abnormal findings of blood chemistry: Secondary | ICD-10-CM

## 2015-08-10 DIAGNOSIS — E041 Nontoxic single thyroid nodule: Secondary | ICD-10-CM

## 2015-08-16 ENCOUNTER — Other Ambulatory Visit: Payer: Self-pay

## 2015-08-16 DIAGNOSIS — E041 Nontoxic single thyroid nodule: Secondary | ICD-10-CM

## 2015-08-28 ENCOUNTER — Ambulatory Visit
Admission: RE | Admit: 2015-08-28 | Discharge: 2015-08-28 | Disposition: A | Discharge status: Home / Self Care | Payer: PPO | Source: Ambulatory Visit | Attending: Family Medicine | Admitting: Family Medicine

## 2015-08-28 ENCOUNTER — Other Ambulatory Visit (HOSPITAL_COMMUNITY)
Admission: RE | Admit: 2015-08-28 | Discharge: 2015-08-28 | Disposition: A | Discharge status: Home / Self Care | Payer: PPO | Source: Ambulatory Visit | Attending: Radiology | Admitting: Radiology

## 2015-08-28 DIAGNOSIS — E042 Nontoxic multinodular goiter: Secondary | ICD-10-CM | POA: Diagnosis not present

## 2015-08-28 DIAGNOSIS — E041 Nontoxic single thyroid nodule: Secondary | ICD-10-CM

## 2015-08-28 DIAGNOSIS — R7989 Other specified abnormal findings of blood chemistry: Secondary | ICD-10-CM | POA: Diagnosis not present

## 2015-08-30 ENCOUNTER — Encounter: Payer: Self-pay | Admitting: Family Medicine

## 2015-08-31 ENCOUNTER — Other Ambulatory Visit: Payer: Self-pay | Admitting: Family Medicine

## 2015-08-31 DIAGNOSIS — E041 Nontoxic single thyroid nodule: Secondary | ICD-10-CM

## 2015-09-06 DIAGNOSIS — Z87898 Personal history of other specified conditions: Secondary | ICD-10-CM | POA: Diagnosis not present

## 2015-09-06 DIAGNOSIS — M810 Age-related osteoporosis without current pathological fracture: Secondary | ICD-10-CM | POA: Diagnosis not present

## 2015-12-11 ENCOUNTER — Encounter: Payer: Self-pay | Admitting: Family Medicine

## 2016-04-04 ENCOUNTER — Other Ambulatory Visit (HOSPITAL_COMMUNITY)
Admission: RE | Admit: 2016-04-04 | Discharge: 2016-04-04 | Disposition: A | Discharge status: Home / Self Care | Payer: PPO | Source: Ambulatory Visit | Attending: Nurse Practitioner | Admitting: Nurse Practitioner

## 2016-04-04 ENCOUNTER — Other Ambulatory Visit: Payer: PPO

## 2016-04-04 ENCOUNTER — Ambulatory Visit (INDEPENDENT_AMBULATORY_CARE_PROVIDER_SITE_OTHER): Payer: PPO | Admitting: Nurse Practitioner

## 2016-04-04 ENCOUNTER — Encounter: Payer: Self-pay | Admitting: Nurse Practitioner

## 2016-04-04 VITALS — BP 144/72 | HR 88 | Temp 98.3°F | Ht 66.0 in | Wt 131.0 lb

## 2016-04-04 DIAGNOSIS — I10 Essential (primary) hypertension: Secondary | ICD-10-CM | POA: Diagnosis not present

## 2016-04-04 DIAGNOSIS — N898 Other specified noninflammatory disorders of vagina: Secondary | ICD-10-CM | POA: Diagnosis not present

## 2016-04-04 DIAGNOSIS — E119 Type 2 diabetes mellitus without complications: Secondary | ICD-10-CM | POA: Diagnosis not present

## 2016-04-04 DIAGNOSIS — R946 Abnormal results of thyroid function studies: Secondary | ICD-10-CM

## 2016-04-04 DIAGNOSIS — R7989 Other specified abnormal findings of blood chemistry: Secondary | ICD-10-CM

## 2016-04-04 DIAGNOSIS — E785 Hyperlipidemia, unspecified: Secondary | ICD-10-CM

## 2016-04-04 DIAGNOSIS — M810 Age-related osteoporosis without current pathological fracture: Secondary | ICD-10-CM

## 2016-04-04 MED ORDER — TERCONAZOLE 0.4 % VA CREA: 1.0000 | TOPICAL_CREAM | Freq: Every day | VAGINAL | 0 refills | Status: DC

## 2016-04-04 MED ORDER — FISH OIL 1200 MG PO CAPS: 1.0000 | ORAL_CAPSULE | Freq: Two times a day (BID) | ORAL | 3 refills | Status: DC

## 2016-04-04 NOTE — Progress Notes (Signed)
Pre visit review using our clinic review tool, if applicable. No additional management support is needed unless otherwise documented below in the visit note. 

## 2016-04-04 NOTE — Progress Notes (Signed)
Subjective:  Patient ID: Terry Armstrong, female    DOB: 08/22/1945  Age: 70 y.o. MRN: [id number]  CC: Establish Care (establish Care/ report low grade fever but want flu shot? conern about thyroid & hair loss. lab in March 2017)   Vaginal Discharge  The patient's primary symptoms include genital itching and vaginal discharge. The patient's pertinent negatives include no genital lesions, genital odor, pelvic pain or vaginal bleeding. This is a recurrent problem. The current episode started in the past 7 days. The problem occurs intermittently. The problem has been waxing and waning. She is not pregnant. Associated symptoms include painful intercourse. Pertinent negatives include no abdominal pain, back pain, chills, constipation, diarrhea, discolored urine, dysuria, fever, flank pain, frequency, headaches, hematuria, joint pain, joint swelling, nausea, rash, sore throat, urgency or vomiting. The vaginal discharge was thick and white. There has been no bleeding. The symptoms are aggravated by intercourse. She has tried antifungals for the symptoms. The treatment provided mild relief. She is sexually active. No, her partner does not have an STD. Contraceptive use: KY gel.  Thyroid Problem  Presents for follow-up visit. Symptoms include dry skin, fatigue and hair loss. Patient reports no anxiety, cold intolerance, constipation, depressed mood, diaphoresis, diarrhea, heat intolerance, hoarse voice, leg swelling, menstrual problem, nail problem, palpitations, tremors, visual change, weight gain or weight loss. The symptoms have been worsening.  She states she was not aware she had to make an appointment with endocrinologist.  Outpatient Medications Prior to Visit  Medication Sig Dispense Refill  . atorvastatin (LIPITOR) 40 MG tablet Take 1 tablet (40 mg total) by mouth daily. 90 tablet 3  . fluticasone (FLONASE) 50 MCG/ACT nasal spray Place 2 sprays into both nostrils at bedtime. 16 g 11  .  multivitamin-iron-minerals-folic acid (CENTRUM) chewable tablet Chew 1 tablet by mouth daily.    . Omega-3 Fatty Acids (FISH OIL) 1200 MG CAPS Take 1 capsule by mouth daily.    Marland Kitchen alendronate (FOSAMAX) 70 MG tablet Take 1 tablet (70 mg total) by mouth once a week. Take with a full glass of water on an empty stomach. (Patient not taking: Reported on 04/04/2016) 12 tablet 3  . metFORMIN (GLUCOPHAGE) 500 MG tablet Take by mouth 1 day or 1 dose.     No facility-administered medications prior to visit.     ROS See HPI  Objective:  BP (!) 144/72   Pulse 88   Temp 98.3 F (36.8 C)   Ht 5\' 6"  (1.676 m)   Wt 131 lb (59.4 kg)   SpO2 100%   BMI 21.14 kg/m   BP Readings from Last 3 Encounters:  04/04/16 (!) 144/72  07/26/15 (!) 171/81  05/08/15 124/86    Wt Readings from Last 3 Encounters:  04/04/16 131 lb (59.4 kg)  07/26/15 121 lb (54.9 kg)  05/08/15 121 lb (54.9 kg)    Physical Exam  Constitutional: She is oriented to person, place, and time. No distress.  HENT:  Mouth/Throat: No oropharyngeal exudate.  Eyes: No scleral icterus.  Neck: Normal range of motion. No thyromegaly present.  Pulmonary/Chest: Effort normal and breath sounds normal.  Genitourinary: There is no tenderness on the right labia. There is no tenderness on the left labia. Cervix exhibits discharge. Cervix exhibits no friability. No erythema, tenderness or bleeding in the vagina. Vaginal discharge found.  Genitourinary Comments: White, thick heterogenous vaginal discharge. atropy vaginal walls.  Musculoskeletal: Normal range of motion. She exhibits no edema.  Lymphadenopathy:    She has no  cervical adenopathy.  Neurological: She is alert and oriented to person, place, and time.  Skin: Skin is warm and dry.  Hair appears thinner on the crown of head.  Vitals reviewed.   Lab Results  Component Value Date   WBC 8.8 05/08/2015   HGB 14.7 05/08/2015   HCT 42.4 05/08/2015   PLT 320 04/08/2013   GLUCOSE 113  (H) 07/26/2015   CHOL 243 (H) 07/26/2015   TRIG 150 (H) 07/26/2015   HDL 65 07/26/2015   LDLCALC 148 (H) 07/26/2015   ALT 10 07/26/2015   AST 11 07/26/2015   NA 140 07/26/2015   K 4.4 07/26/2015   CL 106 07/26/2015   CREATININE 0.68 07/26/2015   BUN 10 07/26/2015   CO2 25 07/26/2015   TSH 0.25 (L) 07/26/2015   HGBA1C 6.1 (H) 07/26/2015   MICROALBUR 0.6 01/11/2015    US Thyroid Biopsy  Result Date: 08/28/2015 INDICATION: Indeterminate bilateral thyroid nodules. EXAM: ULTRASOUND GUIDED THYROID FINE NEEDLE ASPIRATION x 2 COMPARISON:  Thyroid ultrasound performed 08/09/2015 MEDICATIONS: None COMPLICATIONS: None immediate. TECHNIQUE: Informed written consent was obtained from the patient after a discussion of the risks, benefits and alternatives to treatment. Questions regarding the procedure were encouraged and answered. A timeout was performed prior to the initiation of the procedure. Pre-procedural ultrasound scanning demonstrated multinodular thyroid. Dominant right thyroid nodule measuring approximately 2.1 x 1.2 x 1.6 cm. Left thyroid nodule measuring approximately 3.3 x 2.9 x 3.1 cm, predominantly solid. The procedures were planned. The neck was prepped in the usual sterile fashion, and a sterile drape was applied covering the operative field. A timeout was performed prior to the initiation of the procedure. Local anesthesia was provided with 1% lidocaine. Under direct ultrasound guidance, 3 FNA biopsies were performed of the left thyroid nodule with 25 gauge needles. The samples were prepared and submitted to pathology. Under direct ultrasound guidance, 3 FNA biopsies were performed of the right thyroid nodule with 25 gauge needles. The samples were prepared and submitted to pathology. Limited post procedural scanning was negative for hematoma or additional complication. Dressings were placed. The patient tolerated the above procedures procedure well without immediate postprocedural  complication. IMPRESSION: Technically successful ultrasound guided fine needle aspiration of bilateral thyroid nodules as described above. Read by: Ascencion Dike PA-C Electronically Signed   By: Markus Daft M.D.   On: 08/28/2015 08:48   US Thyroid Biopsy  Result Date: 08/28/2015 INDICATION: Indeterminate bilateral thyroid nodules. EXAM: ULTRASOUND GUIDED THYROID FINE NEEDLE ASPIRATION x 2 COMPARISON:  Thyroid ultrasound performed 08/09/2015 MEDICATIONS: None COMPLICATIONS: None immediate. TECHNIQUE: Informed written consent was obtained from the patient after a discussion of the risks, benefits and alternatives to treatment. Questions regarding the procedure were encouraged and answered. A timeout was performed prior to the initiation of the procedure. Pre-procedural ultrasound scanning demonstrated multinodular thyroid. Dominant right thyroid nodule measuring approximately 2.1 x 1.2 x 1.6 cm. Left thyroid nodule measuring approximately 3.3 x 2.9 x 3.1 cm, predominantly solid. The procedures were planned. The neck was prepped in the usual sterile fashion, and a sterile drape was applied covering the operative field. A timeout was performed prior to the initiation of the procedure. Local anesthesia was provided with 1% lidocaine. Under direct ultrasound guidance, 3 FNA biopsies were performed of the left thyroid nodule with 25 gauge needles. The samples were prepared and submitted to pathology. Under direct ultrasound guidance, 3 FNA biopsies were performed of the right thyroid nodule with 25 gauge needles. The samples were prepared  and submitted to pathology. Limited post procedural scanning was negative for hematoma or additional complication. Dressings were placed. The patient tolerated the above procedures procedure well without immediate postprocedural complication. IMPRESSION: Technically successful ultrasound guided fine needle aspiration of bilateral thyroid nodules as described above. Read by: Ascencion Dike PA-C Electronically Signed   By: Markus Daft M.D.   On: 08/28/2015 08:48    Assessment & Plan:   Terry Armstrong was seen today for establish care.  Diagnoses and all orders for this visit:  Vaginal discharge -     Cervicovaginal ancillary only -     terconazole (TERAZOL 7) 0.4 % vaginal cream; Place 1 applicator vaginally at bedtime.  Essential hypertension  Type 2 diabetes mellitus without complication, without long-term current use of insulin (HCC) -     Microalbumin / creatinine urine ratio; Future -     Hemoglobin A1c; Future  Age related osteoporosis, unspecified pathological fracture presence  Abnormal TSH -     Ambulatory referral to Endocrinology  Hyperlipidemia with target LDL less than 100 -     Omega-3 Fatty Acids (FISH OIL) 1200 MG CAPS; Take 1 capsule (1,200 mg total) by mouth 2 (two) times daily. -     Lipid panel; Future   I have discontinued Ms. Dufault's alendronate and metFORMIN. I have also changed her Fish Oil. Additionally, I am having her start on terconazole. Lastly, I am having her maintain her multivitamin-iron-minerals-folic acid, fluticasone, and atorvastatin.  Meds ordered this encounter  Medications  . Omega-3 Fatty Acids (FISH OIL) 1200 MG CAPS    Sig: Take 1 capsule (1,200 mg total) by mouth 2 (two) times daily.    Dispense:  60 capsule    Refill:  3    Order Specific Question:   Supervising Provider    Answer:   Cassandria Anger [[id number]]  . terconazole (TERAZOL 7) 0.4 % vaginal cream    Sig: Place 1 applicator vaginally at bedtime.    Dispense:  45 g    Refill:  0    Order Specific Question:   Supervising Provider    Answer:   Cassandria Anger [[id number]]    Follow-up: Return in about 3 months (around 07/03/2016) for DM and HTN, hyperlipidemia.  Wilfred Lacy, NP

## 2016-04-04 NOTE — Patient Instructions (Addendum)
Go to basement for labs  You will be called with results  You will be called to make appt with endocrinologist.  Use water based lubricants with intercourse.

## 2016-04-08 LAB — CERVICOVAGINAL ANCILLARY ONLY: Wet Prep (BD Affirm): POSITIVE — AB

## 2016-04-24 ENCOUNTER — Encounter: Payer: Self-pay | Admitting: Endocrinology

## 2016-06-17 DIAGNOSIS — H2513 Age-related nuclear cataract, bilateral: Secondary | ICD-10-CM | POA: Diagnosis not present

## 2016-06-17 DIAGNOSIS — E119 Type 2 diabetes mellitus without complications: Secondary | ICD-10-CM | POA: Diagnosis not present

## 2016-06-17 LAB — HM DIABETES EYE EXAM

## 2016-06-20 ENCOUNTER — Encounter: Payer: Self-pay | Admitting: Endocrinology

## 2016-06-20 ENCOUNTER — Ambulatory Visit (INDEPENDENT_AMBULATORY_CARE_PROVIDER_SITE_OTHER): Payer: PPO | Admitting: Endocrinology

## 2016-06-20 ENCOUNTER — Other Ambulatory Visit: Payer: Self-pay | Admitting: Endocrinology

## 2016-06-20 VITALS — BP 126/80 | HR 93 | Ht 66.0 in | Wt 130.0 lb

## 2016-06-20 DIAGNOSIS — E042 Nontoxic multinodular goiter: Secondary | ICD-10-CM

## 2016-06-20 DIAGNOSIS — Z23 Encounter for immunization: Secondary | ICD-10-CM | POA: Diagnosis not present

## 2016-06-20 LAB — T3, FREE: T3, Free: 3.8 pg/mL (ref 2.3–4.2)

## 2016-06-20 LAB — TSH: TSH: 0.25 u[IU]/mL — AB (ref 0.35–4.50)

## 2016-06-20 LAB — T4, FREE: FREE T4: 1 ng/dL (ref 0.60–1.60)

## 2016-06-20 NOTE — Progress Notes (Signed)
Patient ID: Terry Armstrong, female   DOB: 17-Apr-1946, 71 y.o.   MRN: [id number]            Reason for Appointment: Goiter, new consultation    History of Present Illness:   The patient's thyroid enlargement was first discovered in 4/17  She has had difficulty with swallowing  Does not feel like she has any choking sensation in her neck or pressure in any position or when lying down.  Wt Readings from Last 3 Encounters:  06/20/16 130 lb (59 kg)  04/04/16 131 lb (59.4 kg)  07/26/15 121 lb (54.9 kg)    Lab Results  Component Value Date   TSH 0.25 (L) 07/26/2015   TSH 0.266 (L) 01/11/2015   TSH 0.168 (L) 07/28/2014    She has had an ultrasound exam in /17 which showed a multinodular goiter; Dominant 2.1 cm nodule in the right mid thyroid and at the left inferior thyroid   Thyroid biopsy in 08/2015 showed the following:  THYROID, LEFT, FINE NEEDLE ASPIRATION (SPECIMEN 1 OF 2 COLLECTED 08/28/2015): FINDINGS CONSISTENT WITH BENIGN THYROID NODULE (BETHESDA CATEGORY II).  THYROID, RIGHT, FINE NEEDLE ASPIRATION (SPECIMEN 2 OF 2, COLLECTED ON 08/28/15): THE SPECIMEN DEMONSTRATES SCANT FOLLICULAR EPITHELIUM, A NONDIAGNOSTIC PICTURE (BETHESDA CATEGORY I).   Allergies as of 06/20/2016      Reactions   Aspirin    Swelling, scratching in throat      Medication List       Accurate as of 06/20/16 11:19 AM. Always use your most recent med list.          atorvastatin 40 MG tablet Commonly known as:  LIPITOR Take 1 tablet (40 mg total) by mouth daily.   Fish Oil 1200 MG Caps Take 1 capsule (1,200 mg total) by mouth 2 (two) times daily.   fluticasone 50 MCG/ACT nasal spray Commonly known as:  FLONASE Place 2 sprays into both nostrils at bedtime.   multivitamin-iron-minerals-folic acid chewable tablet Chew 1 tablet by mouth daily.   terconazole 0.4 % vaginal cream Commonly known as:  TERAZOL 7 Place 1 applicator vaginally at bedtime.       Allergies:  Allergies    Allergen Reactions  . Aspirin     Swelling, scratching in throat    Past Medical History:  Diagnosis Date  . Breast abscess   . Diabetes mellitus without complication ([place])   . Hyperlipidemia   . Primary snoring     Past Surgical History:  Procedure Laterality Date  . ABDOMINAL HYSTERECTOMY  1993    Family History  Problem Relation Age of Onset  . Diabetes Mother   . Diabetes Father   . Diabetes    . Hypertension Sister   . Hyperlipidemia    . Heart disease Sister     defibrillator  . Kidney failure Maternal Aunt     dialysis  . Diabetes Maternal Aunt   . Hypertension Maternal Aunt   . Diabetes Maternal Aunt   . Heart Problems Maternal Grandmother   . Cancer Maternal Grandmother   . Heart disease Maternal Grandmother   . Hypertension Maternal Grandmother     Social History:  reports that she has been smoking Cigarettes.  She has a 28.00 pack-year smoking history. She has never used smokeless tobacco. She reports that she drinks alcohol. She reports that she does not use drugs.   Review of Systems:  Review of Systems  Constitutional: Positive for weight gain.  HENT: Negative for hoarseness and trouble swallowing.  Respiratory: Negative for daytime sleepiness.   Cardiovascular: Negative for palpitations and leg swelling.  Gastrointestinal: Positive for constipation.  Endocrine: Positive for fatigue.       Few months  Musculoskeletal: Negative for joint pain.  Neurological: Negative for weakness and tremors.      Examination:   BP 126/80   Pulse 93   Ht 5\' 6"  (1.676 m)   Wt 130 lb (59 kg)   SpO2 95%   BMI 20.98 kg/m    General Appearance: pleasant, Averagely built and nourished, not hyperkinetic    Eyes: No abnormal prominence or eyelid swelling.  No lid lag or stare         Neck: The thyroid is enlarged bilaterally. Right lobe is enlarged about 2.5 times normal with firm irregular surface and left lobe is enlarged about twice normal, relatively  firm Trachea is central No stridor There is no lymphadenopathy.     Cardiovascular: Normal  heart sounds, no murmur Respiratory:  Lungs clear Abdomen shows no hepatosplenomegaly Neurological: REFLEXES: at biceps are normal.  Skin: no rash        Assessment/Plan:  Multinodular goiter diagnosed in 2017 This was associated with a relatively low TSH The largest nodule on the left side was benign on biopsy The 2.1 cm right-sided nodule on ultrasound showed inadequate cells for analysis but on ultrasound shows internal colloid and likely benign also  Patient is complaining about weight gain and fatigue which is unlikely be related to her thyroid  However patient needs to be reevaluated with thyroid functions to rule out any mild hyperthyroidism because of her previously low TSH Discussed that if she has mild hyperthyroidism or low TSH would consider a nuclear scan to evaluate the thyroid nodules Otherwise we may recheck an ultrasound to see there is any change Most likely does not need to have another biopsy  [facility] 06/20/2016

## 2016-06-20 NOTE — Progress Notes (Signed)
Please let patient know that the thyroid level is not indicating overactive thyroid, will order repeat thyroid ultrasound to compare previous nodules

## 2016-07-04 ENCOUNTER — Ambulatory Visit: Payer: PPO | Admitting: Nurse Practitioner

## 2016-07-30 ENCOUNTER — Ambulatory Visit
Admission: RE | Admit: 2016-07-30 | Discharge: 2016-07-30 | Disposition: A | Discharge status: Home / Self Care | Payer: PPO | Source: Ambulatory Visit | Attending: Endocrinology | Admitting: Endocrinology

## 2016-07-30 DIAGNOSIS — E042 Nontoxic multinodular goiter: Secondary | ICD-10-CM | POA: Diagnosis not present

## 2016-08-02 NOTE — Progress Notes (Addendum)
Subjective:    Patient ID: Terry Armstrong, female    DOB: 1946-01-28, 71 y.o.   MRN: [id number] Chief Complaint  Patient presents with  . Follow-up    Diabetes check. Blood work     HPI  Terry Armstrong is a delightful 71 yo woman here to follow-up on his chronic medical conditions.  I last saw her 1 year previously for her chronic medical conditions.  1. HTN: well controlled, not checking outside the office. Not on any medications  2.  Type 2 DM: was able to wean off of metformin and last a1c was down to 5.8 -> 6.1 through tlc. Saw Dr. Katy Fitch in Feb 2018.  Does not take an asa due to asa allergy with swelling/throat scratching.  Feet and urine fine.   3.  HPL:  Last LDL 121 so not at goal for <100 so was working on tlc. On atorvastatin 40 and fish oil  4.  Multinodular goiter and abnormal tsh - milly suppressed but t3/t4 levels nml and last checked 6 wks prior. Thyroid US with large nodules but benign biopsy 08/2015. Saw endocrinology Dr. Dwyane Dee 06/2016 and had repeat US that showed no sig change 07/2016 and did not think there was any sig abnormality or require further biposy. If mild hyperthyroidism does develop, would proceed with a nuclear scan.   5.  Osteoporosis - due for repeat dexa at [facility], on fosamax but forgot to take it for a while. Get DEXA with mammogram at [facility] next month. Vitamin D nml 3 yrs prior  H/o mild OSA: Pt has a sleep study around 06/14/2013 that showed periodic leg movement arousals which affect patients sleep quality, evidence of Upper airway resistance syndrome with a respiratory disturbance index of 13.9 and mild obstructive sleep apnea.  Pt did not f/u w/ Dr. Brett Fairy as she was recommended to discuss treatment options.   Tobacco use:  She is still smoking about 3/4 ppd.  She did try on Chantix sev yr ago but did not in 30d. She has been trying some nicotine lozenges as well which helped her husband stop.   She states that 4d prior she began to have wheezing,  shortness of breath, night sweats, eyes itching and discharge, PND. No sign pain/pressure and ears fine. Coughing a lot productive of clear mucous.  She is using flonase which she started about 4d as well and occ benadryl keeping him sleep.   Depression screen [facility] 2/9 08/04/2016 04/04/2016 07/26/2015 05/08/2015 07/28/2014  Decreased Interest 0 0 0 0 0  Down, Depressed, Hopeless 0 0 0 0 0  PHQ - 2 Score 0 0 0 0 0  Altered sleeping - - - - -  Tired, decreased energy - - - - -  Change in appetite - - - - -  Feeling bad or failure about yourself  - - - - -  Trouble concentrating - - - - -  Moving slowly or fidgety/restless - - - - -  Suicidal thoughts - - - - -  PHQ-9 Score - - - - -   Past Medical History:  Diagnosis Date  . Breast abscess   . Diabetes mellitus without complication ([place])   . Hyperlipidemia   . Primary snoring    Past Surgical History:  Procedure Laterality Date  . ABDOMINAL HYSTERECTOMY  1993   Current Outpatient Prescriptions on File Prior to Visit  Medication Sig Dispense Refill  . atorvastatin (LIPITOR) 40 MG tablet Take 1 tablet (40 mg total) by  mouth daily. 90 tablet 3  . fluticasone (FLONASE) 50 MCG/ACT nasal spray Place 2 sprays into both nostrils at bedtime. 16 g 11  . multivitamin-iron-minerals-folic acid (CENTRUM) chewable tablet Chew 1 tablet by mouth daily.    . Omega-3 Fatty Acids (FISH OIL) 1200 MG CAPS Take 1 capsule (1,200 mg total) by mouth 2 (two) times daily. 60 capsule 3   No current facility-administered medications on file prior to visit.    Allergies  Allergen Reactions  . Aspirin     Swelling, scratching in throat   Family History  Problem Relation Age of Onset  . Diabetes Mother   . Diabetes Father   . Diabetes    . Hypertension Sister   . Hyperlipidemia    . Heart disease Sister     defibrillator  . Kidney failure Maternal Aunt     dialysis  . Diabetes Maternal Aunt   . Hypertension Maternal Aunt   . Diabetes Maternal Aunt   .  Heart Problems Maternal Grandmother   . Cancer Maternal Grandmother   . Heart disease Maternal Grandmother   . Hypertension Maternal Grandmother    Social History   Social History  . Marital status: Married    Spouse name: N/A  . Number of children: 0  . Years of education: 14   Occupational History  .  Retired   Social History Main Topics  . Smoking status: Current Every Day Smoker    Packs/day: 1.00    Years: 28.00    Types: Cigarettes  . Smokeless tobacco: Never Used  . Alcohol use Yes     Comment: social  . Drug use: No  . Sexual activity: Not Asked   Other Topics Concern  . None   Social History Narrative   Patient lives with her significant other.   Patient is retired.   Patient drinks 2-3 cups of caffeine daily in the winter.   Patient has a college education.   Patient is right-handed.          Review of Systems  Constitutional: Positive for diaphoresis and fatigue. Negative for activity change, appetite change, chills and fever.  HENT: Positive for congestion, postnasal drip, rhinorrhea, sneezing and sore throat. Negative for ear discharge, ear pain, facial swelling, nosebleeds, sinus pain, sinus pressure, trouble swallowing and voice change.   Eyes: Positive for discharge and itching. Negative for redness.  Respiratory: Positive for cough, shortness of breath and wheezing.   Cardiovascular: Negative for chest pain.  Gastrointestinal: Negative for abdominal pain, nausea and vomiting.  Musculoskeletal: Negative for neck pain and neck stiffness.  Skin: Negative for rash.  Neurological: Positive for headaches. Negative for dizziness and syncope.  Hematological: Positive for adenopathy.  Psychiatric/Behavioral: Positive for sleep disturbance.       Objective:   Physical Exam  Constitutional: She is oriented to person, place, and time. She appears well-developed and well-nourished. She does not appear ill. No distress.  HENT:  Head: Normocephalic and  atraumatic.  Right Ear: External ear and ear canal normal. Tympanic membrane is retracted.  Left Ear: External ear and ear canal normal. Tympanic membrane is injected and retracted.  Nose: Mucosal edema and rhinorrhea present.  Mouth/Throat: Uvula is midline. Mucous membranes are dry. Posterior oropharyngeal erythema present. No oropharyngeal exudate, posterior oropharyngeal edema or tonsillar abscesses.  + PND  Eyes: Conjunctivae are normal. Right eye exhibits no discharge. Left eye exhibits no discharge. No scleral icterus.  Neck: Normal range of motion. Neck supple. Thyroid mass present.  No thyromegaly present.  Cardiovascular: Normal rate, regular rhythm, normal heart sounds and intact distal pulses.   Pulmonary/Chest: Effort normal. She has decreased breath sounds.  Breath sounds normal after duoneb and pt reports sxs improved  Lymphadenopathy:       Head (right side): Submandibular adenopathy present. No preauricular and no posterior auricular adenopathy present.       Head (left side): Submandibular adenopathy present. No preauricular and no posterior auricular adenopathy present.    She has no cervical adenopathy.       Right: No supraclavicular adenopathy present.       Left: No supraclavicular adenopathy present.  Neurological: She is alert and oriented to person, place, and time.  Skin: Skin is warm and dry. She is not diaphoretic. No erythema.  Psychiatric: She has a normal mood and affect. Her behavior is normal.      BP (!) 164/76   Pulse 78   Temp 98 F (36.7 C) (Oral)   Resp 18   Ht 5\' 6"  (1.676 m)   Wt 130 lb (59 kg)   SpO2 98%   BMI 20.98 kg/m  Recheck bp 130/90    Results for orders placed or performed in visit on 08/04/16  POCT glycosylated hemoglobin (Hb A1C)  Result Value Ref Range   Hemoglobin A1C 6.5     Assessment & Plan:  She had will schedule her mammogram and dexa for mid-May.   Sched for [facility] 1. Essential hypertension - has bp cuff at home so  start checking wkly and call if >140/90 as would start lisinopril  2. Type 2 diabetes mellitus without complication, without long-term current use of insulin (HCC) - a1c highest it has been at 6.5 over the past 3 yr. Pt does not want to start metformin so advise working on tlc and recheck in 3 mos at [facility]  3. Age related osteoporosis, unspecified pathological fracture presence - -restart fosamax - had just fallen off MAR. Pt will sched dexa scan for mid-May  4. Tobacco user - pt considering retrying chantix but cost is a deterrent so currently trying nicotine lozenges. Refer for lung cancer screening CT  5. Hyperlipidemia with target LDL less than 100 - atorvastatin refilled - same dose; ldl at goal <100.  6. Abnormal TSH - recent eval by Dr. Dwyane Dee and repeat thyroid US showed no sig change with benign biopsy. Watchful waiting.  7. Hay fever - start antihistamine, cont nasal steroid. Try cough suppressant and atrovent nasal spray. Did have sxs improve with duoneb so try alb inhaler prn wheezing, shortness of breath    Orders Placed This Encounter  Procedures  . Comprehensive metabolic panel    Order Specific Question:   Has the patient fasted?    Answer:   Yes  . Lipid panel    Order Specific Question:   Has the patient fasted?    Answer:   Yes  . CBC with Differential/Platelet  . Microalbumin/Creatinine Ratio, Urine  . VITAMIN D 25 Hydroxy (Vit-D Deficiency, Fractures)  . Ambulatory Referral for Lung Cancer Scre    Referral Priority:   Routine    Referral Type:   Consultation    Referral Reason:   Specialty Services Required    Number of Visits Requested:   1  . POCT glycosylated hemoglobin (Hb A1C)  . HM Diabetes Foot Exam    Meds ordered this encounter  Medications  . DISCONTD: alendronate (FOSAMAX) 70 MG tablet    Sig: Take 1 tablet (70  mg total) by mouth every 7 (seven) days. Take with a full glass of water on an empty stomach.    Dispense:  4 tablet    Refill:  11  . albuterol  (PROVENTIL) (2.5 MG/3ML) 0.083% nebulizer solution 2.5 mg  . ipratropium (ATROVENT) nebulizer solution 0.5 mg  . alendronate (FOSAMAX) 70 MG tablet    Sig: Take 1 tablet (70 mg total) by mouth every 7 (seven) days. Take with a full glass of water on an empty stomach.    Dispense:  12 tablet    Refill:  3  . cetirizine (ZYRTEC) 10 MG tablet    Sig: Take 1 tablet (10 mg total) by mouth at bedtime.    Dispense:  30 tablet    Refill:  11  . guaiFENesin-codeine 100-10 MG/5ML syrup    Sig: Take 5-10 mLs by mouth every 4 (four) hours as needed for cough.    Dispense:  180 mL    Refill:  0  . benzonatate (TESSALON) 200 MG capsule    Sig: Take 1 capsule (200 mg total) by mouth 3 (three) times daily as needed for cough.    Dispense:  30 capsule    Refill:  0  . cetirizine (ZYRTEC) 10 MG tablet    Sig: Take 1 tablet (10 mg total) by mouth at bedtime.    Dispense:  30 tablet    Refill:  11  . ipratropium (ATROVENT) 0.03 % nasal spray    Sig: Place 2 sprays into the nose 4 (four) times daily.    Dispense:  30 mL    Refill:  1      Delman Cheadle, M.D.  Primary Care at [facility] [address] [place], [place] [address] [phone] phone [phone] fax  08/04/16 9:02 AM

## 2016-08-04 ENCOUNTER — Encounter: Payer: Self-pay | Admitting: Family Medicine

## 2016-08-04 ENCOUNTER — Ambulatory Visit (INDEPENDENT_AMBULATORY_CARE_PROVIDER_SITE_OTHER): Payer: PPO | Admitting: Family Medicine

## 2016-08-04 VITALS — BP 130/90 | HR 78 | Temp 98.0°F | Resp 18 | Ht 66.0 in | Wt 130.0 lb

## 2016-08-04 DIAGNOSIS — I10 Essential (primary) hypertension: Secondary | ICD-10-CM

## 2016-08-04 DIAGNOSIS — Z720 Tobacco use: Secondary | ICD-10-CM

## 2016-08-04 DIAGNOSIS — E119 Type 2 diabetes mellitus without complications: Secondary | ICD-10-CM

## 2016-08-04 DIAGNOSIS — E785 Hyperlipidemia, unspecified: Secondary | ICD-10-CM

## 2016-08-04 DIAGNOSIS — R946 Abnormal results of thyroid function studies: Secondary | ICD-10-CM | POA: Diagnosis not present

## 2016-08-04 DIAGNOSIS — J301 Allergic rhinitis due to pollen: Secondary | ICD-10-CM | POA: Diagnosis not present

## 2016-08-04 DIAGNOSIS — M810 Age-related osteoporosis without current pathological fracture: Secondary | ICD-10-CM

## 2016-08-04 DIAGNOSIS — R7989 Other specified abnormal findings of blood chemistry: Secondary | ICD-10-CM

## 2016-08-04 LAB — POCT GLYCOSYLATED HEMOGLOBIN (HGB A1C): Hemoglobin A1C: 6.5

## 2016-08-04 MED ORDER — ALBUTEROL SULFATE 108 (90 BASE) MCG/ACT IN AEPB: 2.0000 | INHALATION_SPRAY | RESPIRATORY_TRACT | 1 refills | Status: DC | PRN

## 2016-08-04 MED ORDER — ALENDRONATE SODIUM 70 MG PO TABS: 70.0000 mg | ORAL_TABLET | ORAL | 11 refills | Status: DC

## 2016-08-04 MED ORDER — IPRATROPIUM BROMIDE 0.02 % IN SOLN
0.5000 mg | Freq: Once | RESPIRATORY_TRACT | Status: AC
  Administered 2016-08-04: 0.5 mg via RESPIRATORY_TRACT

## 2016-08-04 MED ORDER — ALENDRONATE SODIUM 70 MG PO TABS
70.0000 mg | ORAL_TABLET | ORAL | 3 refills | Status: DC
Start: 2016-08-04 — End: 2017-03-16

## 2016-08-04 MED ORDER — BENZONATATE 200 MG PO CAPS: 200.0000 mg | ORAL_CAPSULE | Freq: Three times a day (TID) | ORAL | 0 refills | Status: DC | PRN

## 2016-08-04 MED ORDER — GUAIFENESIN-CODEINE 100-10 MG/5ML PO SOLN: 5.0000 mL | ORAL | 0 refills | Status: DC | PRN

## 2016-08-04 MED ORDER — CETIRIZINE HCL 10 MG PO TABS: 10.0000 mg | ORAL_TABLET | Freq: Every day | ORAL | 11 refills | Status: DC

## 2016-08-04 MED ORDER — IPRATROPIUM BROMIDE 0.03 % NA SOLN: 2.0000 | Freq: Four times a day (QID) | NASAL | 1 refills | Status: DC

## 2016-08-04 MED ORDER — ALBUTEROL SULFATE (2.5 MG/3ML) 0.083% IN NEBU
2.5000 mg | INHALATION_SOLUTION | Freq: Once | RESPIRATORY_TRACT | Status: AC
  Administered 2016-08-04: 2.5 mg via RESPIRATORY_TRACT

## 2016-08-04 NOTE — Patient Instructions (Addendum)
I would like you to start checking her blood pressure at home once a week. If it is 140 or greater on the top number or 90 or greater on the problem number more than half the time please call and let bewe can get you started on a blood pressure medication.   [place] is now offering annual lung cancer screening by low-dose CT scan.  This is covered for qualifying patients and your insurance will be checked before the procedure.  Call the lung cancer screening nurse navigators at [phone] to learn more about this and get scheduled.  I have also put in a referral for you so if you prefer you can wait for the screening nurse navigator to call you.  Hot showers or breathing in steam may help loosen the congestion.  Using a netti pot or sinus rinse is also likely to help you feel better and keep this from progressing.  Use the ipratropium nasal spray as needed throughout the day and use the fluticasone nasal spray every night before bed along with a cetrizine pill for at least 2 weeks.  I recommend augmenting with generic mucinex to help you move out the congestion.  If no improvement or you are getting worse, come back as you might need a course of steroids but hopefully with all of the above, you can avoid it.    IF you received an x-ray today, you will receive an invoice from [facility]. Please contact [facility] at [phone] with questions or concerns regarding your invoice.   IF you received labwork today, you will receive an invoice from [facility]. Please contact [facility] at [phone] with questions or concerns regarding your invoice.   Our billing staff will not be able to assist you with questions regarding bills from these companies.  You will be contacted with the lab results as soon as they are available. The fastest way to get your results is to activate your My Chart account. Instructions are located on the last page of this paperwork. If you have not heard  from [place] regarding the results in 2 weeks, please contact this office.     We recommend that you schedule a mammogram and DEXA scan for mid-May for breast cancer screening. Typically, you do not need a referral to do this. Please contact a local imaging center to schedule your mammogram and DEXA scan.  [facility] - [phone]   Bone Health Bones protect organs, store calcium, and anchor muscles. Good health habits, such as eating nutritious foods and exercising regularly, are important for maintaining healthy bones. They can also help to prevent a condition that causes bones to lose density and become weak and brittle (osteoporosis). Why is bone mass important? Bone mass refers to the amount of bone tissue that you have. The higher your bone mass, the stronger your bones. An important step toward having healthy bones throughout life is to have strong and dense bones during childhood. A young adult who has a high bone mass is more likely to have a high bone mass later in life. Bone mass at its greatest it is called peak bone mass. A large decline in bone mass occurs in older adults. In women, it occurs about the time of menopause. During this time, it is important to practice good health habits, because if more bone is lost than what is replaced, the bones will become less healthy and more likely to break (fracture). If you find that you have a  low bone mass, you may be able to prevent osteoporosis or further bone loss by changing your diet and lifestyle. How can I find out if my bone mass is low? Bone mass can be measured with an X-ray test that is called a bone mineral density (BMD) test. This test is recommended for all women who are age 22 or older. It may also be recommended for men who are age 38 or older, or for people who are more likely to develop osteoporosis due to:  Having bones that break easily.  Having a long-term disease that weakens bones, such as kidney disease or  rheumatoid arthritis.  Having menopause earlier than normal.  Taking medicine that weakens bones, such as steroids, thyroid hormones, or hormone treatment for breast cancer or prostate cancer.  Smoking.  Drinking three or more alcoholic drinks each day. What are the nutritional recommendations for healthy bones? To have healthy bones, you need to get enough of the right minerals and vitamins. Most nutrition experts recommend getting these nutrients from the foods that you eat. Nutritional recommendations vary from person to person. Ask your health care provider what is healthy for you. Here are some general guidelines. Calcium Recommendations  Calcium is the most important (essential) mineral for bone health. Most people can get enough calcium from their diet, but supplements may be recommended for people who are at risk for osteoporosis. Good sources of calcium include:  Dairy products, such as low-fat or nonfat milk, cheese, and yogurt.  Dark green leafy vegetables, such as bok choy and broccoli.  Calcium-fortified foods, such as orange juice, cereal, bread, soy beverages, and tofu products.  Nuts, such as almonds. Follow these recommended amounts for daily calcium intake:  Children, age 14?3: 700 mg.  Children, age 36?8: 1,000 mg.  Children, age 62?13: 1,300 mg.  Teens, age 68?18: 1,300 mg.  Adults, age 73?50: 1,000 mg.  Adults, age 146?70:  Men: 1,000 mg.  Women: 1,200 mg.  Adults, age 366 or older: 1,200 mg.  Pregnant and breastfeeding females:  Teens: 1,300 mg.  Adults: 1,000 mg. Vitamin D Recommendations  Vitamin D is the most essential vitamin for bone health. It helps the body to absorb calcium. Sunlight stimulates the skin to make vitamin D, so be sure to get enough sunlight. If you live in a cold climate or you do not get outside often, your health care provider may recommend that you take vitamin D supplements. Good sources of vitamin D in your diet  include:  Egg yolks.  Saltwater fish.  Milk and cereal fortified with vitamin D. Follow these recommended amounts for daily vitamin D intake:  Children and teens, age 2?18: 24 international units.  Adults, age 61 or younger: 400-800 international units.  Adults, age 6 or older: 800-1,000 international units. Other Nutrients  Other nutrients for bone health include:  Phosphorus. This mineral is found in meat, poultry, dairy foods, nuts, and legumes. The recommended daily intake for adult men and adult women is 700 mg.  Magnesium. This mineral is found in seeds, nuts, dark green vegetables, and legumes. The recommended daily intake for adult men is 400?420 mg. For adult women, it is 310?320 mg.  Vitamin K. This vitamin is found in green leafy vegetables. The recommended daily intake is 120 mg for adult men and 90 mg for adult women. What type of physical activity is best for building and maintaining healthy bones? Weight-bearing and strength-building activities are important for building and maintaining peak bone mass. Weight-bearing  activities cause muscles and bones to work against gravity. Strength-building activities increases muscle strength that supports bones. Weight-bearing and muscle-building activities include:  Walking and hiking.  Jogging and running.  Dancing.  Gym exercises.  Lifting weights.  Tennis and racquetball.  Climbing stairs.  Aerobics. Adults should get at least 30 minutes of moderate physical activity on most days. Children should get at least 60 minutes of moderate physical activity on most days. Ask your health care provide what type of exercise is best for you. Where can I find more information? For more information, check out the following websites:  [place]: [employer].se  [employer] of Health: http://www.niams.AnonymousEar.fr.asp This information is  not intended to replace advice given to you by your health care provider. Make sure you discuss any questions you have with your health care provider. Document Released: 07/12/2003 Document Revised: 11/09/2015 Document Reviewed: 04/26/2014 Elsevier Interactive Patient Education  2017 [employer].

## 2016-08-05 ENCOUNTER — Encounter: Payer: Self-pay | Admitting: Family Medicine

## 2016-08-05 LAB — CBC WITH DIFFERENTIAL/PLATELET
Basophils Absolute: 0 10*3/uL (ref 0.0–0.2)
Basos: 0 %
EOS (ABSOLUTE): 0.1 10*3/uL (ref 0.0–0.4)
EOS: 2 %
HEMATOCRIT: 40.5 % (ref 34.0–46.6)
Hemoglobin: 13.8 g/dL (ref 11.1–15.9)
Immature Grans (Abs): 0 10*3/uL (ref 0.0–0.1)
Immature Granulocytes: 0 %
LYMPHS ABS: 1.5 10*3/uL (ref 0.7–3.1)
Lymphs: 24 %
MCH: 32.1 pg (ref 26.6–33.0)
MCHC: 34.1 g/dL (ref 31.5–35.7)
MCV: 94 fL (ref 79–97)
MONOS ABS: 0.7 10*3/uL (ref 0.1–0.9)
Monocytes: 11 %
NEUTROS ABS: 4 10*3/uL (ref 1.4–7.0)
Neutrophils: 63 %
Platelets: 290 10*3/uL (ref 150–379)
RBC: 4.3 x10E6/uL (ref 3.77–5.28)
RDW: 14.7 % (ref 12.3–15.4)
WBC: 6.4 10*3/uL (ref 3.4–10.8)

## 2016-08-05 LAB — LIPID PANEL
Chol/HDL Ratio: 3 ratio (ref 0.0–4.4)
Cholesterol, Total: 172 mg/dL (ref 100–199)
HDL: 58 mg/dL (ref >39)
LDL Calculated: 92 mg/dL (ref 0–99)
TRIGLYCERIDES: 112 mg/dL (ref 0–149)
VLDL CHOLESTEROL CAL: 22 mg/dL (ref 5–40)

## 2016-08-05 LAB — COMPREHENSIVE METABOLIC PANEL
ALT: 19 IU/L (ref 0–32)
AST: 20 IU/L (ref 0–40)
Albumin/Globulin Ratio: 2 (ref 1.2–2.2)
Albumin: 4.5 g/dL (ref 3.5–4.8)
Alkaline Phosphatase: 84 IU/L (ref 39–117)
BILIRUBIN TOTAL: 0.3 mg/dL (ref 0.0–1.2)
BUN/Creatinine Ratio: 15 (ref 12–28)
BUN: 9 mg/dL (ref 8–27)
CALCIUM: 8.9 mg/dL (ref 8.7–10.3)
CHLORIDE: 103 mmol/L (ref 96–106)
CO2: 24 mmol/L (ref 18–29)
Creatinine, Ser: 0.61 mg/dL (ref 0.57–1.00)
GFR, EST AFRICAN AMERICAN: 105 mL/min/{1.73_m2} (ref >59)
GFR, EST NON AFRICAN AMERICAN: 92 mL/min/{1.73_m2} (ref >59)
GLUCOSE: 110 mg/dL — AB (ref 65–99)
Globulin, Total: 2.3 g/dL (ref 1.5–4.5)
Potassium: 4.2 mmol/L (ref 3.5–5.2)
Sodium: 143 mmol/L (ref 134–144)
Total Protein: 6.8 g/dL (ref 6.0–8.5)

## 2016-08-05 LAB — MICROALBUMIN / CREATININE URINE RATIO
Creatinine, Urine: 98.3 mg/dL
Microalb/Creat Ratio: 11.3 mg/g creat (ref 0.0–30.0)
Microalbumin, Urine: 11.1 ug/mL

## 2016-08-05 LAB — VITAMIN D 25 HYDROXY (VIT D DEFICIENCY, FRACTURES): VIT D 25 HYDROXY: 43.4 ng/mL (ref 30.0–100.0)

## 2016-08-05 MED ORDER — ATORVASTATIN CALCIUM 40 MG PO TABS: 40.0000 mg | ORAL_TABLET | Freq: Every day | ORAL | 3 refills | Status: DC

## 2016-08-05 NOTE — Addendum Note (Signed)
Addended by: Delman Cheadle on: 08/05/2016 01:23 PM   Modules accepted: Orders

## 2016-08-12 ENCOUNTER — Other Ambulatory Visit: Payer: Self-pay | Admitting: Acute Care

## 2016-08-12 DIAGNOSIS — F17210 Nicotine dependence, cigarettes, uncomplicated: Principal | ICD-10-CM

## 2016-08-13 ENCOUNTER — Other Ambulatory Visit: Payer: Self-pay | Admitting: Family Medicine

## 2016-08-13 NOTE — Telephone Encounter (Signed)
08/04/16 last refills

## 2016-08-17 NOTE — Progress Notes (Signed)
Please let patient know that the ultrasound shows no significant change from a year ago, no biopsy at this time; needed will need to see her back in one year for follow-up, please schedule

## 2016-08-19 NOTE — Telephone Encounter (Signed)
Correction, rx up front

## 2016-08-19 NOTE — Telephone Encounter (Signed)
Called to [facility] 

## 2016-09-03 ENCOUNTER — Encounter: Payer: Self-pay | Admitting: Acute Care

## 2016-09-03 ENCOUNTER — Ambulatory Visit (INDEPENDENT_AMBULATORY_CARE_PROVIDER_SITE_OTHER): Payer: PPO | Admitting: Acute Care

## 2016-09-03 ENCOUNTER — Ambulatory Visit (INDEPENDENT_AMBULATORY_CARE_PROVIDER_SITE_OTHER)
Admission: RE | Admit: 2016-09-03 | Discharge: 2016-09-03 | Disposition: A | Discharge status: Home / Self Care | Payer: PPO | Source: Ambulatory Visit | Attending: Acute Care | Admitting: Acute Care

## 2016-09-03 DIAGNOSIS — Z87891 Personal history of nicotine dependence: Secondary | ICD-10-CM

## 2016-09-03 DIAGNOSIS — F17210 Nicotine dependence, cigarettes, uncomplicated: Secondary | ICD-10-CM | POA: Diagnosis not present

## 2016-09-03 NOTE — Progress Notes (Signed)
Shared Decision Making Visit Lung Cancer Screening Program [phone])   Eligibility:  Age 71 y.o.  Pack Years Smoking History Calculation 34-pack-year smoking history (# packs/per year x # years smoked)  Recent History of coughing up blood  no  Unexplained weight loss? no ( >Than 15 pounds within the last 6 months )  Prior History Lung / other cancer no (Diagnosis within the last 5 years already requiring surveillance chest CT Scans).  Smoking Status Current Smoker  Former Smokers: Years since quit: Not applicable  Quit Date: Not applicable  Visit Components:  Discussion included one or more decision making aids. yes  Discussion included risk/benefits of screening. yes  Discussion included potential follow up diagnostic testing for abnormal scans. yes  Discussion included meaning and risk of over diagnosis. yes  Discussion included meaning and risk of False Positives. yes  Discussion included meaning of total radiation exposure. yes  Counseling Included:  Importance of adherence to annual lung cancer LDCT screening. yes  Impact of comorbidities on ability to participate in the program. yes  Ability and willingness to under diagnostic treatment. yes  Smoking Cessation Counseling:  Current Smokers:   Discussed importance of smoking cessation. yes  Information about tobacco cessation classes and interventions provided to patient. yes  Patient provided with "ticket" for LDCT Scan. yes  Symptomatic Patient. no  Counseling: Not applicable  Diagnosis Code: Tobacco Use Z72.0  Asymptomatic Patient yes  Counseling (Intermediate counseling: > three minutes counseling) F7903  Former Smokers:   Discussed the importance of maintaining cigarette abstinence. yes  Diagnosis Code: Personal History of Nicotine Dependence. Y33.383  Information about tobacco cessation classes and interventions provided to patient. Yes  Patient provided with "ticket" for LDCT Scan.  yes  Written Order for Lung Cancer Screening with LDCT placed in Epic. Yes (CT Chest Lung Cancer Screening Low Dose W/O CM) [id number] Z12.2-Screening of respiratory organs Z87.891-Personal history of nicotine dependence  I have spent 25 minutes of face to face time with Ms. Rampersaud discussing the risks and benefits of lung cancer screening. We viewed a power point together that explained in detail the above noted topics. We paused at intervals to allow for questions to be asked and answered to ensure understanding.We discussed that the single most powerful action that she can take to decrease her risk of developing lung cancer is to quit smoking. We discussed whether or not she is ready to commit to setting a quit date. She is currently not ready to set a quit date. We discussed options for tools to aid in quitting smoking including nicotine replacement therapy, non-nicotine medications, support groups, Quit Smart classes, and behavior modification. We discussed that often times setting smaller, more achievable goals, such as eliminating 1 cigarette a day for a week and then 2 cigarettes a day for a week can be helpful in slowly decreasing the number of cigarettes smoked. This allows for a sense of accomplishment as well as providing a clinical benefit. I gave [name] the " Be Stronger Than Your Excuses" card with contact information for community resources, classes, free nicotine replacement therapy, and access to mobile apps, text messaging, and on-line smoking cessation help. I have also given her my card and contact information in the event she needs to contact me. We discussed the time and location of the scan, and that either Doroteo Glassman RN or I will call with the results once we receive them. I have offered Mrs. Oddo  a copy of the power point we  viewed  as a resource in the event they need reinforcement of the concepts we discussed today in the office. Additionally I have provided her with the  patient pamphlet. The patient verbalized understanding of all of  the above and had no further questions upon leaving the office. They have my contact information in the event they have any further questions.  I spent four  minutes counseling on smoking cessation and the health risks of continued tobacco abuse.  I explained to the patient that there has been a high incidence of coronary artery disease noted on these exams. I explained that this is a non-gated exam therefore degree or severity cannot be determined. This patient is currently on statin therapy, and takes fish oil daily. I have asked the patient to follow-up with their PCP regarding any incidental finding of coronary artery disease and management with diet or medication as their PCP  feels is clinically indicated. The patient verbalized understanding of the above and had no further questions upon completion of the visit.      Magdalen Spatz, NP 09/03/2016

## 2016-09-08 ENCOUNTER — Other Ambulatory Visit: Payer: Self-pay | Admitting: Acute Care

## 2016-09-08 DIAGNOSIS — F17210 Nicotine dependence, cigarettes, uncomplicated: Principal | ICD-10-CM

## 2016-09-18 ENCOUNTER — Encounter: Payer: Self-pay | Admitting: Family Medicine

## 2016-09-18 DIAGNOSIS — Z1231 Encounter for screening mammogram for malignant neoplasm of breast: Secondary | ICD-10-CM | POA: Diagnosis not present

## 2016-11-09 DIAGNOSIS — J432 Centrilobular emphysema: Secondary | ICD-10-CM | POA: Insufficient documentation

## 2016-11-09 NOTE — Progress Notes (Deleted)
Patient ID: Terry Armstrong, female   DOB: 05-29-1945, 71 y.o.   MRN: [id number] Subjective:    Terry Armstrong is a 71 y.o. female who presents for Medicare Annual/Subsequent preventive examination. I last saw Mrs. Terry Armstrong 3 months prior and root teen follow-up for her chronic medical conditions. Last wellness visit with me March 2017  Preventive Screening-Counseling & Management  Tobacco History  Smoking Status  . Current Every Day Smoker  . Packs/day: 1.00  . Years: 34.00  . Types: Cigarettes  Smokeless Tobacco  . Never Used     Problems Prior to Visit 1.  HTN: well controlled with lifestyle, started checking outside the office qwk? Low threshold for starting acei/arb 2.  Type 2 DM: lifestyle control. Was prev on metformin but pt motivated to stay off meds. Does not take an asa due to asa allergy with swelling/throat scratching.Saw Dr. Katy Fitch Feb 2018. Foot exam 08/04/16. Microalb nml 08/04/16. Lab Results  Component Value Date   HGBA1C 6.5 08/04/2016   HGBA1C 6.1 (H) 07/26/2015   HGBA1C 5.8 01/11/2015   HGBA1C 5.9 07/28/2014    3.  HLD:  On atorvastatin 40 and fish oil Lab Results  Component Value Date   LDLCALC 92 08/04/2016   LDLCALC 148 (H) 07/26/2015    4.   Goiter and abnml tsh - benign, saw Dr. Dwyane Dee 06/2016. Monitor thyroid blood tests q6-12 mos and proceed with nuclear scan if mild hyperthyroidism develops 5.  Osteoporosis - due for repeat dexa at [facility], on fosamax but forgot to take it for a while so restarted 08/2016. dexa done 09/2016???? [facility]? Lab Results  Component Value Date   VD25OH 43.4 08/04/2016   VD25OH 65 04/08/2013   6.  Tobacco use - Had screening lung CT 09/03/2016 with benign nodules and did show centrilobular emphysema (new diagnosis). Repeat in 1 yr. Smoking 3/4 ppd. She did try Chantix several years ago but did not stop smoking within the month and it is to expensive to retry. Tried nicotine lozenges which worked for her husband. 7.  CAD- on statin,  fish oil  Current Problems (verified) Patient Active Problem List   Diagnosis Date Noted  . Periodic limb movement sleep disorder 01/11/2015  . Abnormal TSH 01/11/2015  . Upper airway resistance syndrome 01/11/2015  . Mild obstructive sleep apnea 01/11/2015  . Osteoporosis 01/10/2015  . Snoring 05/23/2013  . Non-restorative sleep 05/23/2013  . Primary snoring   . HTN (hypertension) 05/11/2013  . Left breast abscess 11/30/2012  . DM (diabetes mellitus) ([place]) 06/04/2011  . Tobacco user 06/04/2011  . Hyperlipidemia with target LDL less than 100 06/04/2011    Medications Prior to Visit Current Outpatient Prescriptions on File Prior to Visit  Medication Sig Dispense Refill  . Albuterol Sulfate (PROAIR RESPICLICK) 353 (90 Base) MCG/ACT AEPB Inhale 2 puffs into the lungs every 4 (four) hours as needed. 1 each 1  . alendronate (FOSAMAX) 70 MG tablet Take 1 tablet (70 mg total) by mouth every 7 (seven) days. Take with a full glass of water on an empty stomach. 12 tablet 3  . atorvastatin (LIPITOR) 40 MG tablet Take 1 tablet (40 mg total) by mouth daily. 90 tablet 3  . benzonatate (TESSALON) 200 MG capsule TAKE 1 CAPSULE BY MOUTH THREE TIMES DAILY AS NEEDED FOR COUGH 30 capsule 0  . cetirizine (ZYRTEC) 10 MG tablet Take 1 tablet (10 mg total) by mouth at bedtime. 30 tablet 11  . cetirizine (ZYRTEC) 10 MG tablet Take 1 tablet (10 mg  total) by mouth at bedtime. 30 tablet 11  . fluticasone (FLONASE) 50 MCG/ACT nasal spray Place 2 sprays into both nostrils at bedtime. 16 g 11  . ipratropium (ATROVENT) 0.03 % nasal spray Place 2 sprays into the nose 4 (four) times daily. 30 mL 1  . multivitamin-iron-minerals-folic acid (CENTRUM) chewable tablet Chew 1 tablet by mouth daily.    . Omega-3 Fatty Acids (FISH OIL) 1200 MG CAPS Take 1 capsule (1,200 mg total) by mouth 2 (two) times daily. 60 capsule 3  . VIRTUSSIN A/C 100-10 MG/5ML syrup TAKE  5 ML TO 10 ML BY MOUTH EVERY 4 HOURS AS NEEDED FOR COUGH 180  mL 0   No current facility-administered medications on file prior to visit.     Current Medications (verified) Current Outpatient Prescriptions  Medication Sig Dispense Refill  . Albuterol Sulfate (PROAIR RESPICLICK) 621 (90 Base) MCG/ACT AEPB Inhale 2 puffs into the lungs every 4 (four) hours as needed. 1 each 1  . alendronate (FOSAMAX) 70 MG tablet Take 1 tablet (70 mg total) by mouth every 7 (seven) days. Take with a full glass of water on an empty stomach. 12 tablet 3  . atorvastatin (LIPITOR) 40 MG tablet Take 1 tablet (40 mg total) by mouth daily. 90 tablet 3  . benzonatate (TESSALON) 200 MG capsule TAKE 1 CAPSULE BY MOUTH THREE TIMES DAILY AS NEEDED FOR COUGH 30 capsule 0  . cetirizine (ZYRTEC) 10 MG tablet Take 1 tablet (10 mg total) by mouth at bedtime. 30 tablet 11  . cetirizine (ZYRTEC) 10 MG tablet Take 1 tablet (10 mg total) by mouth at bedtime. 30 tablet 11  . fluticasone (FLONASE) 50 MCG/ACT nasal spray Place 2 sprays into both nostrils at bedtime. 16 g 11  . ipratropium (ATROVENT) 0.03 % nasal spray Place 2 sprays into the nose 4 (four) times daily. 30 mL 1  . multivitamin-iron-minerals-folic acid (CENTRUM) chewable tablet Chew 1 tablet by mouth daily.    . Omega-3 Fatty Acids (FISH OIL) 1200 MG CAPS Take 1 capsule (1,200 mg total) by mouth 2 (two) times daily. 60 capsule 3  . VIRTUSSIN A/C 100-10 MG/5ML syrup TAKE  5 ML TO 10 ML BY MOUTH EVERY 4 HOURS AS NEEDED FOR COUGH 180 mL 0   No current facility-administered medications for this visit.    taking centrum but not taking extra calcium vitamin D.  Allergies (verified) Aspirin   PAST HISTORY Past Surgical History:  Procedure Laterality Date  . ABDOMINAL HYSTERECTOMY  1993    Family History Family History  Problem Relation Age of Onset  . Diabetes Mother   . Diabetes Father   . Diabetes Unknown   . Hypertension Sister   . Hyperlipidemia Unknown   . Heart disease Sister        defibrillator  . Kidney failure  Maternal Aunt        dialysis  . Diabetes Maternal Aunt   . Hypertension Maternal Aunt   . Diabetes Maternal Aunt   . Heart Problems Maternal Grandmother   . Cancer Maternal Grandmother   . Heart disease Maternal Grandmother   . Hypertension Maternal Grandmother     Social History Social History  Substance Use Topics  . Smoking status: Current Every Day Smoker    Packs/day: 1.00    Years: 34.00    Types: Cigarettes  . Smokeless tobacco: Never Used  . Alcohol use Yes     Comment: social     Are there smokers in your home (  other than you)? Yes -  Husbands smokes as well but they both have cut down to 1/2 ppd - using nicotine lozenges and stopping smoking in house and in car  Risk Factors Current exercise habits: Home exercise routine includes housework, caring for several people.  Dietary issues discussed: eats normally - tries to get in 3 meals/d, no junk food snacks.  Husband is on insulin diabetic so often pt follows diabetic diet as well.   Cardiac risk factors: advanced age (older than 8 for men, 53 for women), dyslipidemia, hypertension and smoking/ tobacco exposure.  Depression Screen Depression screen [facility] 2/9 08/04/2016 04/04/2016 07/26/2015 05/08/2015 07/28/2014  Decreased Interest 0 0 0 0 0  Down, Depressed, Hopeless 0 0 0 0 0  PHQ - 2 Score 0 0 0 0 0  Altered sleeping - - - - -  Tired, decreased energy - - - - -  Change in appetite - - - - -  Feeling bad or failure about yourself  - - - - -  Trouble concentrating - - - - -  Moving slowly or fidgety/restless - - - - -  Suicidal thoughts - - - - -  PHQ-9 Score - - - - -    Activities of Daily Living In your present state of health, do you have any difficulty performing the following activities?:  Driving? No Managing money?  No Feeding yourself? No Getting from bed to chair? No Climbing a flight of stairs? No Preparing food and eating?: No Bathing or showering? No - husband has shower chair and has bar in  shower Getting dressed: No Getting to the toilet? No Using the toilet:No Moving around from place to place: No In the past year have you fallen or had a near fall?:No - not since fractured her foot last yr   Are you sexually active?  Yes  Do you have more than one partner?  No  Hearing Difficulties: No Do you often ask people to speak up or repeat themselves? No Do you experience ringing or noises in your ears? No Do you have difficulty understanding soft or whispered voices? No   Do you feel that you have a problem with memory? No  Do you often misplace items? No  Do you feel safe at home?  Yes  Cognitive Testing  Alert? Yes  Normal Appearance?Yes  Oriented to person? Yes  Place? Yes   Time? Yes  Recall of three objects?  Yes  Can perform simple calculations? Yes  Displays appropriate judgment?Yes  Can read the correct time from a watch face?Yes   Advanced Directives have been discussed with the patient? Yes does not have HCPOA - don't put her on life support but would be ok if short term with good prognosis. She has discussed this with her husband in detail who she would like to be her HCPOA which would defer to her sister if her husband is unable.  No exam data present  List the Names of Other Physician/Practitioners you currently use: 1.  [facility] - Dr. Katy Fitch: Seen (last we have was 08/2013) - needs to make an appt  Indicate any recent Medical Services you may have received from other than [facility] providers in the past year (date may be approximate).  Immunization History  Administered Date(s) Administered  . Influenza Split 01/28/2012  . Influenza, High Dose Seasonal PF 06/20/2016  . Influenza,inj,Quad PF,36+ Mos 04/08/2013, 04/15/2014, 01/11/2015  . Pneumococcal Conjugate-13 07/26/2015  . Pneumococcal Polysaccharide-23 11/14/2010  . Tdap  10/11/2008    Screening Tests Health Maintenance  Topic Date Due  . INFLUENZA VACCINE  12/03/2016  . HEMOGLOBIN A1C  02/03/2017   . OPHTHALMOLOGY EXAM  06/17/2017  . FOOT EXAM  08/04/2017  . URINE MICROALBUMIN  08/04/2017  . MAMMOGRAM  09/05/2017  . TETANUS/TDAP  10/12/2018  . COLONOSCOPY  12/20/2018  . DEXA SCAN  Completed  . Hepatitis C Screening  Completed  . PNA vac Low Risk Adult  Completed    All answers were reviewed with the patient and necessary referrals were made:  SHAW,EVA, MD   11/09/2016   History reviewed: allergies, current medications, past family history, past medical history, past social history, past surgical history and problem list  Review of Systems Pertinent items noted in HPI and remainder of comprehensive ROS otherwise negative.    Objective:  There were no vitals taken for this visit. No exam data present  There were no vitals taken for this visit.  General Appearance:    Alert, cooperative, no distress, appears stated age  Head:    Normocephalic, without obvious abnormality, atraumatic  Eyes:    PERRL, conjunctiva/corneas clear, EOM's intact, fundi    benign, both eyes  Ears:    Normal TM's and external ear canals, both ears  Nose:   Nares normal, septum midline, mucosa normal, no drainage    or sinus tenderness  Throat:   Lips, mucosa, and tongue normal; teeth and gums normal  Neck:   Supple, symmetrical, trachea midline, no adenopathy;    thyroid:  no enlargement/tenderness/nodules; no carotid   bruit or JVD  Back:     Symmetric, no curvature, ROM normal, no CVA tenderness  Lungs:     Clear to auscultation bilaterally, respirations unlabored  Chest Wall:    No tenderness or deformity   Heart:    Regular rate and rhythm, S1 and S2 normal, no murmur, rub   or gallop  Breast Exam:    No tenderness, masses, or nipple abnormality  Abdomen:     Soft, non-tender, bowel sounds active all four quadrants,    no masses, no organomegaly        Extremities:   Extremities normal, atraumatic, no cyanosis or edema  Pulses:   2+ and symmetric all extremities  Skin:   Skin color,  texture, turgor normal, no rashes or lesions  Lymph nodes:   Cervical, supraclavicular, and axillary nodes normal  Neurologic:   CNII-XII intact, normal strength, sensation and reflexes    throughout       Assessment:        Plan:  Thyroid panel, a1c, cmp, cbc, ua   During the course of the visit the patient was educated and counseled about appropriate screening and preventive services including:  Had mammogram and dexa May 2018 at [facility]? Emphysema see in chest CT. rec pre and post-spirometry at next OV  Shingrix? (did get zostavax 2016)   Pneumococcal vaccine - 23 valent done 2012 (at 71 yo) and 13 valent 2017  Influenza vaccine - done this season and annually  Td vaccine - done 2010  Zostavax done 2016  Screening electrocardiogram - exercise tolerance tst done 05/2013 with Dr. Debara Pickett  Screening mammography - at [facility] - 09/2016? Done 09/2015  Screening Pap smear and pelvic exam - s/p abdominal hysterectomy in 1993 for   Bone densitometry screening - ordered dexa at [place] since last was 08/2013. Vit D nml. On Fosamax  Colorectal cancer screening - done 12/19/2008 with small polyp removed  from transverse colon - repeat in 2010  Diabetes screening - a1c today  Glaucoma screening - sees Dr. Katy Fitch annually  Nutrition counseling   Smoking cessation counseling - with pulm and screening lung CT 09/2016  Advanced directives: HCPOA??   Diet review for nutrition referral? Yes ____  Not Indicated ____   Patient Instructions (the written plan) was given to the patient.  Medicare Attestation I have personally reviewed: The patient's medical and social history Their use of alcohol, tobacco or illicit drugs Their current medications and supplements The patient's functional ability including ADLs,fall risks, home safety risks, cognitive, and hearing and visual impairment Diet and physical activities Evidence for depression or mood disorders  The patient's weight, height,  BMI, and visual acuity have been recorded in the chart.  I have made referrals, counseling, and provided education to the patient based on review of the above and I have provided the patient with a written personalized care plan for preventive services.     Delman Cheadle, MD   11/09/2016

## 2016-11-09 NOTE — Progress Notes (Deleted)
Subjective:    Patient ID: Terry Armstrong, female    DOB: Oct 11, 1945, 71 y.o.   MRN: [id number] No chief complaint on file.   HPI  Terry Armstrong is a delightful 71 yo woman here to follow-up on his chronic medical conditions.  I last saw her 1 year previously for her chronic medical conditions.  1. HTN: well controlled, not checking outside the office. Not on any medications  2.  Type 2 DM: was able to wean off of metformin and last a1c was down to 5.8 -> 6.1 through tlc. Saw Dr. Katy Fitch in Feb 2018.  Does not take an asa due to asa allergy with swelling/throat scratching.  Feet and urine fine.   3.  HPL:  Last LDL 121 so not at goal for <100 so was working on tlc. On atorvastatin 40 and fish oil  4.  Multinodular goiter and abnormal tsh - milly suppressed but t3/t4 levels nml and last checked 6 wks prior. Thyroid US with large nodules but benign biopsy 08/2015. Saw endocrinology Dr. Dwyane Dee 06/2016 and had repeat US that showed no sig change 07/2016 and did not think there was any sig abnormality or require further biposy. If mild hyperthyroidism does develop, would proceed with a nuclear scan.   5.  Osteoporosis - due for repeat dexa at [facility], on fosamax but forgot to take it for a while. Get DEXA with mammogram at [facility] next month. Vitamin D nml 3 yrs prior  H/o mild OSA: Pt has a sleep study around 06/14/2013 that showed periodic leg movement arousals which affect patients sleep quality, evidence of Upper airway resistance syndrome with a respiratory disturbance index of 13.9 and mild obstructive sleep apnea.  Pt did not f/u w/ Dr. Brett Fairy as she was recommended to discuss treatment options.   Tobacco use:  She is still smoking about 3/4 ppd.  She did try on Chantix sev yr ago but did not in 30d. She has been trying some nicotine lozenges as well which helped her husband stop.   She states that 4d prior she began to have wheezing, shortness of breath, night sweats, eyes itching and discharge,  PND. No sign pain/pressure and ears fine. Coughing a lot productive of clear mucous.  She is using flonase which she started about 4d as well and occ benadryl keeping him sleep.   Depression screen [facility] 2/9 08/04/2016 04/04/2016 07/26/2015 05/08/2015 07/28/2014  Decreased Interest 0 0 0 0 0  Down, Depressed, Hopeless 0 0 0 0 0  PHQ - 2 Score 0 0 0 0 0  Altered sleeping - - - - -  Tired, decreased energy - - - - -  Change in appetite - - - - -  Feeling bad or failure about yourself  - - - - -  Trouble concentrating - - - - -  Moving slowly or fidgety/restless - - - - -  Suicidal thoughts - - - - -  PHQ-9 Score - - - - -   Past Medical History:  Diagnosis Date  . Breast abscess   . Diabetes mellitus without complication ([place])   . Hyperlipidemia   . Primary snoring    Past Surgical History:  Procedure Laterality Date  . ABDOMINAL HYSTERECTOMY  1993   Current Outpatient Prescriptions on File Prior to Visit  Medication Sig Dispense Refill  . Albuterol Sulfate (PROAIR RESPICLICK) 379 (90 Base) MCG/ACT AEPB Inhale 2 puffs into the lungs every 4 (four) hours as needed. 1 each 1  .  alendronate (FOSAMAX) 70 MG tablet Take 1 tablet (70 mg total) by mouth every 7 (seven) days. Take with a full glass of water on an empty stomach. 12 tablet 3  . atorvastatin (LIPITOR) 40 MG tablet Take 1 tablet (40 mg total) by mouth daily. 90 tablet 3  . benzonatate (TESSALON) 200 MG capsule TAKE 1 CAPSULE BY MOUTH THREE TIMES DAILY AS NEEDED FOR COUGH 30 capsule 0  . cetirizine (ZYRTEC) 10 MG tablet Take 1 tablet (10 mg total) by mouth at bedtime. 30 tablet 11  . cetirizine (ZYRTEC) 10 MG tablet Take 1 tablet (10 mg total) by mouth at bedtime. 30 tablet 11  . fluticasone (FLONASE) 50 MCG/ACT nasal spray Place 2 sprays into both nostrils at bedtime. 16 g 11  . ipratropium (ATROVENT) 0.03 % nasal spray Place 2 sprays into the nose 4 (four) times daily. 30 mL 1  . multivitamin-iron-minerals-folic acid (CENTRUM) chewable  tablet Chew 1 tablet by mouth daily.    . Omega-3 Fatty Acids (FISH OIL) 1200 MG CAPS Take 1 capsule (1,200 mg total) by mouth 2 (two) times daily. 60 capsule 3  . VIRTUSSIN A/C 100-10 MG/5ML syrup TAKE  5 ML TO 10 ML BY MOUTH EVERY 4 HOURS AS NEEDED FOR COUGH 180 mL 0   No current facility-administered medications on file prior to visit.    Allergies  Allergen Reactions  . Aspirin     Swelling, scratching in throat   Family History  Problem Relation Age of Onset  . Diabetes Mother   . Diabetes Father   . Diabetes Unknown   . Hypertension Sister   . Hyperlipidemia Unknown   . Heart disease Sister        defibrillator  . Kidney failure Maternal Aunt        dialysis  . Diabetes Maternal Aunt   . Hypertension Maternal Aunt   . Diabetes Maternal Aunt   . Heart Problems Maternal Grandmother   . Cancer Maternal Grandmother   . Heart disease Maternal Grandmother   . Hypertension Maternal Grandmother    Social History   Social History  . Marital status: Married    Spouse name: N/A  . Number of children: 0  . Years of education: 14   Occupational History  .  Retired   Social History Main Topics  . Smoking status: Current Every Day Smoker    Packs/day: 1.00    Years: 34.00    Types: Cigarettes  . Smokeless tobacco: Never Used  . Alcohol use Yes     Comment: social  . Drug use: No  . Sexual activity: Not on file   Other Topics Concern  . Not on file   Social History Narrative   Patient lives with her significant other.   Patient is retired.   Patient drinks 2-3 cups of caffeine daily in the winter.   Patient has a college education.   Patient is right-handed.          Review of Systems  Constitutional: Positive for diaphoresis and fatigue. Negative for activity change, appetite change, chills and fever.  HENT: Positive for congestion, postnasal drip, rhinorrhea, sneezing and sore throat. Negative for ear discharge, ear pain, facial swelling, nosebleeds, sinus  pain, sinus pressure, trouble swallowing and voice change.   Eyes: Positive for discharge and itching. Negative for redness.  Respiratory: Positive for cough, shortness of breath and wheezing.   Cardiovascular: Negative for chest pain.  Gastrointestinal: Negative for abdominal pain, nausea and vomiting.  Musculoskeletal: Negative for neck pain and neck stiffness.  Skin: Negative for rash.  Neurological: Positive for headaches. Negative for dizziness and syncope.  Hematological: Positive for adenopathy.  Psychiatric/Behavioral: Positive for sleep disturbance.       Objective:   Physical Exam  Constitutional: She is oriented to person, place, and time. She appears well-developed and well-nourished. She does not appear ill. No distress.  HENT:  Head: Normocephalic and atraumatic.  Right Ear: External ear and ear canal normal. Tympanic membrane is retracted.  Left Ear: External ear and ear canal normal. Tympanic membrane is injected and retracted.  Nose: Mucosal edema and rhinorrhea present.  Mouth/Throat: Uvula is midline. Mucous membranes are dry. Posterior oropharyngeal erythema present. No oropharyngeal exudate, posterior oropharyngeal edema or tonsillar abscesses.  + PND  Eyes: Conjunctivae are normal. Right eye exhibits no discharge. Left eye exhibits no discharge. No scleral icterus.  Neck: Normal range of motion. Neck supple. Thyroid mass present. No thyromegaly present.  Cardiovascular: Normal rate, regular rhythm, normal heart sounds and intact distal pulses.   Pulmonary/Chest: Effort normal. She has decreased breath sounds.  Breath sounds normal after duoneb and pt reports sxs improved  Lymphadenopathy:       Head (right side): Submandibular adenopathy present. No preauricular and no posterior auricular adenopathy present.       Head (left side): Submandibular adenopathy present. No preauricular and no posterior auricular adenopathy present.    She has no cervical adenopathy.        Right: No supraclavicular adenopathy present.       Left: No supraclavicular adenopathy present.  Neurological: She is alert and oriented to person, place, and time.  Skin: Skin is warm and dry. She is not diaphoretic. No erythema.  Psychiatric: She has a normal mood and affect. Her behavior is normal.      There were no vitals taken for this visit.     Results for orders placed or performed in visit on 08/04/16  Comprehensive metabolic panel  Result Value Ref Range   Glucose 110 (H) 65 - 99 mg/dL   BUN 9 8 - 27 mg/dL   Creatinine, Ser 0.61 0.57 - 1.00 mg/dL   GFR calc non Af Amer 92 >59 mL/min/1.73   GFR calc Af Amer 105 >59 mL/min/1.73   BUN/Creatinine Ratio 15 12 - 28   Sodium 143 134 - 144 mmol/L   Potassium 4.2 3.5 - 5.2 mmol/L   Chloride 103 96 - 106 mmol/L   CO2 24 18 - 29 mmol/L   Calcium 8.9 8.7 - 10.3 mg/dL   Total Protein 6.8 6.0 - 8.5 g/dL   Albumin 4.5 3.5 - 4.8 g/dL   Globulin, Total 2.3 1.5 - 4.5 g/dL   Albumin/Globulin Ratio 2.0 1.2 - 2.2   Bilirubin Total 0.3 0.0 - 1.2 mg/dL   Alkaline Phosphatase 84 39 - 117 IU/L   AST 20 0 - 40 IU/L   ALT 19 0 - 32 IU/L  Lipid panel  Result Value Ref Range   Cholesterol, Total 172 100 - 199 mg/dL   Triglycerides 112 0 - 149 mg/dL   HDL 58 >39 mg/dL   VLDL Cholesterol Cal 22 5 - 40 mg/dL   LDL Calculated 92 0 - 99 mg/dL   Chol/HDL Ratio 3.0 0.0 - 4.4 ratio  CBC with Differential/Platelet  Result Value Ref Range   WBC 6.4 3.4 - 10.8 x10E3/uL   RBC 4.30 3.77 - 5.28 x10E6/uL   Hemoglobin 13.8 11.1 - 15.9 g/dL  Hematocrit 40.5 34.0 - 46.6 %   MCV 94 79 - 97 fL   MCH 32.1 26.6 - 33.0 pg   MCHC 34.1 31.5 - 35.7 g/dL   RDW 14.7 12.3 - 15.4 %   Platelets 290 150 - 379 x10E3/uL   Neutrophils 63 Not Estab. %   Lymphs 24 Not Estab. %   Monocytes 11 Not Estab. %   Eos 2 Not Estab. %   Basos 0 Not Estab. %   Neutrophils Absolute 4.0 1.4 - 7.0 x10E3/uL   Lymphocytes Absolute 1.5 0.7 - 3.1 x10E3/uL   Monocytes  Absolute 0.7 0.1 - 0.9 x10E3/uL   EOS (ABSOLUTE) 0.1 0.0 - 0.4 x10E3/uL   Basophils Absolute 0.0 0.0 - 0.2 x10E3/uL   Immature Granulocytes 0 Not Estab. %   Immature Grans (Abs) 0.0 0.0 - 0.1 x10E3/uL  VITAMIN D 25 Hydroxy (Vit-D Deficiency, Fractures)  Result Value Ref Range   Vit D, 25-Hydroxy 43.4 30.0 - 100.0 ng/mL  Microalbumin / creatinine urine ratio  Result Value Ref Range   Creatinine, Urine 98.3 Not Estab. mg/dL   Albumin, Urine 11.1 Not Estab. ug/mL   Microalb/Creat Ratio 11.3 0.0 - 30.0 mg/g creat  POCT glycosylated hemoglobin (Hb A1C)  Result Value Ref Range   Hemoglobin A1C 6.5     Assessment & Plan:  She had will schedule her mammogram and dexa for mid-May.   Sched for [facility] 1. Essential hypertension - has bp cuff at home so start checking wkly and call if >140/90 as would start lisinopril  2. Type 2 diabetes mellitus without complication, without long-term current use of insulin (H[facility]) - a1c highest it has been at 6.5 over the past 3 yr. Pt does not want to start metformin so advise working on tlc and recheck in 3 mos at [facility]  3. Age related osteoporosis, unspecified pathological fracture presence - -restart fosamax - had just fallen off MAR. Pt will sched dexa scan for mid-May  4. Tobacco user - pt considering retrying chantix but cost is a deterrent so currently trying nicotine lozenges. Refer for lung cancer screening CT  5. Hyperlipidemia with target LDL less than 100 - atorvastatin refilled - same dose; ldl at goal <100.  6. Abnormal TSH - recent eval by Dr. Dwyane Dee and repeat thyroid US showed no sig change with benign biopsy. Watchful waiting.  7. Hay fever - start antihistamine, cont nasal steroid. Try cough suppressant and atrovent nasal spray. Did have sxs improve with duoneb so try alb inhaler prn wheezing, shortness of breath

## 2016-11-10 ENCOUNTER — Ambulatory Visit (INDEPENDENT_AMBULATORY_CARE_PROVIDER_SITE_OTHER): Payer: PPO | Admitting: Family Medicine

## 2016-11-10 ENCOUNTER — Encounter: Payer: Self-pay | Admitting: Family Medicine

## 2016-11-10 VITALS — BP 130/90 | HR 74 | Temp 97.6°F | Resp 18 | Ht 66.0 in | Wt 129.0 lb

## 2016-11-10 DIAGNOSIS — G4733 Obstructive sleep apnea (adult) (pediatric): Secondary | ICD-10-CM | POA: Diagnosis not present

## 2016-11-10 DIAGNOSIS — Z720 Tobacco use: Secondary | ICD-10-CM | POA: Diagnosis not present

## 2016-11-10 DIAGNOSIS — G4761 Periodic limb movement disorder: Secondary | ICD-10-CM

## 2016-11-10 DIAGNOSIS — R7989 Other specified abnormal findings of blood chemistry: Secondary | ICD-10-CM

## 2016-11-10 DIAGNOSIS — J432 Centrilobular emphysema: Secondary | ICD-10-CM

## 2016-11-10 DIAGNOSIS — R946 Abnormal results of thyroid function studies: Secondary | ICD-10-CM | POA: Diagnosis not present

## 2016-11-10 DIAGNOSIS — E785 Hyperlipidemia, unspecified: Secondary | ICD-10-CM

## 2016-11-10 DIAGNOSIS — M810 Age-related osteoporosis without current pathological fracture: Secondary | ICD-10-CM | POA: Diagnosis not present

## 2016-11-10 DIAGNOSIS — E119 Type 2 diabetes mellitus without complications: Secondary | ICD-10-CM | POA: Diagnosis not present

## 2016-11-10 DIAGNOSIS — I10 Essential (primary) hypertension: Secondary | ICD-10-CM

## 2016-11-10 LAB — POCT URINALYSIS DIP (MANUAL ENTRY)
BILIRUBIN UA: NEGATIVE mg/dL
Blood, UA: NEGATIVE
GLUCOSE UA: NEGATIVE mg/dL
Leukocytes, UA: NEGATIVE
Nitrite, UA: NEGATIVE
Protein Ur, POC: NEGATIVE mg/dL
SPEC GRAV UA: 1.025 (ref 1.010–1.025)
Urobilinogen, UA: 1 E.U./dL
pH, UA: 6 (ref 5.0–8.0)

## 2016-11-10 LAB — POCT GLYCOSYLATED HEMOGLOBIN (HGB A1C): Hemoglobin A1C: 6.1

## 2016-11-10 MED ORDER — LOSARTAN POTASSIUM 25 MG PO TABS: 25.0000 mg | ORAL_TABLET | Freq: Every day | ORAL | 0 refills | Status: DC

## 2016-11-10 NOTE — Patient Instructions (Addendum)
   IF you received an x-ray today, you will receive an invoice from [facility]. Please contact [facility] at [phone] with questions or concerns regarding your invoice.   IF you received labwork today, you will receive an invoice from [facility]. Please contact [facility] at [phone] with questions or concerns regarding your invoice.   Our billing staff will not be able to assist you with questions regarding bills from these companies.  You will be contacted with the lab results as soon as they are available. The fastest way to get your results is to activate your My Chart account. Instructions are located on the last page of this paperwork. If you have not heard from us regarding the results in 2 weeks, please contact this office.    Insomnia Insomnia is a sleep disorder that makes it difficult to fall asleep or to stay asleep. Insomnia can cause tiredness (fatigue), low energy, difficulty concentrating, mood swings, and poor performance at work or school. There are three different ways to classify insomnia:  Difficulty falling asleep.  Difficulty staying asleep.  Waking up too early in the morning.  Any type of insomnia can be long-term (chronic) or short-term (acute). Both are common. Short-term insomnia usually lasts for three months or less. Chronic insomnia occurs at least three times a week for longer than three months. What are the causes? Insomnia may be caused by another condition, situation, or substance, such as:  Anxiety.  Certain medicines.  Gastroesophageal reflux disease (GERD) or other gastrointestinal conditions.  Asthma or other breathing conditions.  Restless legs syndrome, sleep apnea, or other sleep disorders.  Chronic pain.  Menopause. This may include hot flashes.  Stroke.  Abuse of alcohol, tobacco, or illegal drugs.  Depression.  Caffeine.  Neurological disorders, such as Alzheimer disease.  An overactive thyroid  (hyperthyroidism).  The cause of insomnia may not be known. What increases the risk? Risk factors for insomnia include:  Gender. Women are more commonly affected than men.  Age. Insomnia is more common as you get older.  Stress. This may involve your professional or personal life.  Income. Insomnia is more common in people with lower income.  Lack of exercise.  Irregular work schedule or night shifts.  Traveling between different time zones.  What are the signs or symptoms? If you have insomnia, trouble falling asleep or trouble staying asleep is the main symptom. This may lead to other symptoms, such as:  Feeling fatigued.  Feeling nervous about going to sleep.  Not feeling rested in the morning.  Having trouble concentrating.  Feeling irritable, anxious, or depressed.  How is this treated? Treatment for insomnia depends on the cause. If your insomnia is caused by an underlying condition, treatment will focus on addressing the condition. Treatment may also include:  Medicines to help you sleep.  Counseling or therapy.  Lifestyle adjustments.  Follow these instructions at home:  Take medicines only as directed by your health care provider.  Keep regular sleeping and waking hours. Avoid naps.  Keep a sleep diary to help you and your health care provider figure out what could be causing your insomnia. Include: ? When you sleep. ? When you wake up during the night. ? How well you sleep. ? How rested you feel the next day. ? Any side effects of medicines you are taking. ? What you eat and drink.  Make your bedroom a comfortable place where it is easy to fall asleep: ? Put up shades or special blackout   curtains to block light from outside. ? Use a white noise machine to block noise. ? Keep the temperature cool.  Exercise regularly as directed by your health care provider. Avoid exercising right before bedtime.  Use relaxation techniques to manage stress. Ask  your health care provider to suggest some techniques that may work well for you. These may include: ? Breathing exercises. ? Routines to release muscle tension. ? Visualizing peaceful scenes.  Cut back on alcohol, caffeinated beverages, and cigarettes, especially close to bedtime. These can disrupt your sleep.  Do not overeat or eat spicy foods right before bedtime. This can lead to digestive discomfort that can make it hard for you to sleep.  Limit screen use before bedtime. This includes: ? Watching TV. ? Using your smartphone, tablet, and computer.  Stick to a routine. This can help you fall asleep faster. Try to do a quiet activity, brush your teeth, and go to bed at the same time each night.  Get out of bed if you are still awake after 15 minutes of trying to sleep. Keep the lights down, but try reading or doing a quiet activity. When you feel sleepy, go back to bed.  Make sure that you drive carefully. Avoid driving if you feel very sleepy.  Keep all follow-up appointments as directed by your health care provider. This is important. Contact a health care provider if:  You are tired throughout the day or have trouble in your daily routine due to sleepiness.  You continue to have sleep problems or your sleep problems get worse. Get help right away if:  You have serious thoughts about hurting yourself or someone else. This information is not intended to replace advice given to you by your health care provider. Make sure you discuss any questions you have with your health care provider. Document Released: 04/18/2000 Document Revised: 09/21/2015 Document Reviewed: 01/20/2014 [employer] Interactive Patient Education  2018 [employer]  

## 2016-11-10 NOTE — Progress Notes (Signed)
Subjective:    Patient ID: Terry Armstrong, female    DOB: 08-14-1945, 71 y.o.   MRN: [id number] Chief Complaint  Patient presents with  . Hypertension  . Follow-up    HPI  Difficulty sleeping sometimes as she has all the thoughts of things she has to do running around her head.  She has used benadryl in the past which worked well. Stress caring for her husband, a [place] vet who doesn't do anything - just sits and watches tv all day or sits in the yard. Looking forward to her vacation to visit her sister in [place].  1.  HTN: well controlled with lifestyle, started checking outside the office qwk? Low threshold for starting acei/arb. Has been under a lot of stress caretaking of her whole house, her brother around the corner, and her husband. Has been outside and not much water. Does keep to a low salt diet. 2.  Type 2 DM: lifestyle control. Was prev on metformin but pt motivated to stay off meds. Does not take an asa due to asa allergy with swelling/throat scratching.Saw Dr. Katy Fitch Feb 2018. Foot exam 08/04/16. Microalb nml 08/04/16.      Lab Results  Component Value Date   HGBA1C 6.5 08/04/2016   HGBA1C 6.1 (H) 07/26/2015   HGBA1C 5.8 01/11/2015   HGBA1C 5.9 07/28/2014    3.  HLD:  On atorvastatin 40 and fish oil      Lab Results  Component Value Date   LDLCALC 92 08/04/2016   LDLCALC 148 (H) 07/26/2015    4.   Goiter and abnml tsh - benign, saw Dr. Dwyane Dee 06/2016. Monitor thyroid blood tests q6-12 mos and proceed with nuclear scan if mild hyperthyroidism develops 5.  Osteoporosis - due for repeat dexa at [facility], on fosamax but forgot to take it for a while so restarted 08/2016. She states she had a bone scan more recently so she could not get it down in 09/2016 when she had her mamm[facility] at [facility]. No ca/vit D supp      Lab Results  Component Value Date   VD25OH 43.4 08/04/2016   VD25OH 65 04/08/2013   6.  Tobacco use - Had screening lung CT 09/03/2016 with benign nodules and  did show centrilobular emphysema (new diagnosis). Repeat in 1 yr. Smoking 3/4 ppd. She did try Chantix several years ago but did not stop smoking within the month and it is to expensive to retry. Tried nicotine lozenges which worked for her husband. Smoking depends on her stress level and does not smoking when she wakes up in the morning but can goes hours without. Still just working on cutting down.  7.  CAD- on statin, fish oil    -s/p abdominal hysterectomy in 1993 for recurrent miscarriages, menses problems, no cancer. Removed cervix but left in ovaries - had done in [place]  Past Medical History:  Diagnosis Date  . Breast abscess   . Diabetes mellitus without complication ([place])   . Hyperlipidemia   . Primary snoring    Past Surgical History:  Procedure Laterality Date  . ABDOMINAL HYSTERECTOMY  1993   Current Outpatient Prescriptions on File Prior to Visit  Medication Sig Dispense Refill  . Albuterol Sulfate (PROAIR RESPICLICK) 793 (90 Base) MCG/ACT AEPB Inhale 2 puffs into the lungs every 4 (four) hours as needed. 1 each 1  . alendronate (FOSAMAX) 70 MG tablet Take 1 tablet (70 mg total) by mouth every 7 (seven) days. Take with a full glass  of water on an empty stomach. 12 tablet 3  . atorvastatin (LIPITOR) 40 MG tablet Take 1 tablet (40 mg total) by mouth daily. 90 tablet 3  . ipratropium (ATROVENT) 0.03 % nasal spray Place 2 sprays into the nose 4 (four) times daily. 30 mL 1  . multivitamin-iron-minerals-folic acid (CENTRUM) chewable tablet Chew 1 tablet by mouth daily.    . Omega-3 Fatty Acids (FISH OIL) 1200 MG CAPS Take 1 capsule (1,200 mg total) by mouth 2 (two) times daily. 60 capsule 3   No current facility-administered medications on file prior to visit.    Allergies  Allergen Reactions  . Aspirin     Swelling, scratching in throat   Family History  Problem Relation Age of Onset  . Diabetes Mother   . Diabetes Father   . Diabetes Unknown   . Hypertension Sister    . Hyperlipidemia Unknown   . Heart disease Sister        defibrillator  . Kidney failure Maternal Aunt        dialysis  . Diabetes Maternal Aunt   . Hypertension Maternal Aunt   . Diabetes Maternal Aunt   . Heart Problems Maternal Grandmother   . Cancer Maternal Grandmother   . Heart disease Maternal Grandmother   . Hypertension Maternal Grandmother    Social History   Social History  . Marital status: Married    Spouse name: N/A  . Number of children: 0  . Years of education: 14   Occupational History  .  Retired   Social History Main Topics  . Smoking status: Current Every Day Smoker    Packs/day: 1.00    Years: 34.00    Types: Cigarettes  . Smokeless tobacco: Never Used  . Alcohol use Yes     Comment: social  . Drug use: No  . Sexual activity: Not Asked   Other Topics Concern  . None   Social History Narrative   Patient lives with her significant other.   Patient is retired.   Patient drinks 2-3 cups of caffeine daily in the winter.   Patient has a college education.   Patient is right-handed.         Depression screen [facility] 2/9 11/10/2016 08/04/2016 04/04/2016 07/26/2015 05/08/2015  Decreased Interest 0 0 0 0 0  Down, Depressed, Hopeless 0 0 0 0 0  PHQ - 2 Score 0 0 0 0 0  Altered sleeping - - - - -  Tired, decreased energy - - - - -  Change in appetite - - - - -  Feeling bad or failure about yourself  - - - - -  Trouble concentrating - - - - -  Moving slowly or fidgety/restless - - - - -  Suicidal thoughts - - - - -  PHQ-9 Score - - - - -    Review of Systems  Constitutional: Positive for fatigue. Negative for appetite change, chills, diaphoresis and fever.  Eyes: Negative for visual disturbance.  Respiratory: Negative for cough and shortness of breath.   Cardiovascular: Negative for chest pain, palpitations and leg swelling.  Genitourinary: Negative for decreased urine volume.  Neurological: Negative for syncope and headaches.  Hematological: Does not  bruise/bleed easily.  Psychiatric/Behavioral: Positive for sleep disturbance. The patient is nervous/anxious.        Objective:   Physical Exam  Constitutional: She is oriented to person, place, and time. She appears well-developed and well-nourished. No distress.  HENT:  Head: Normocephalic and atraumatic.  Right Ear: External ear normal.  Left Ear: External ear normal.  Eyes: Conjunctivae are normal. No scleral icterus.  Neck: Normal range of motion. Neck supple. No thyromegaly present.  Cardiovascular: Normal rate, regular rhythm, normal heart sounds and intact distal pulses.   Pulmonary/Chest: Effort normal and breath sounds normal. No respiratory distress.  Musculoskeletal: She exhibits no edema.  Lymphadenopathy:    She has no cervical adenopathy.  Neurological: She is alert and oriented to person, place, and time.  Skin: Skin is warm and dry. She is not diaphoretic. No erythema.  Psychiatric: She has a normal mood and affect. Her behavior is normal.      BP 130/90   Pulse 74   Temp 97.6 F (36.4 C) (Oral)   Resp 18   Ht 5\' 6"  (1.676 m)   Wt 129 lb (58.5 kg)   SpO2 97%   BMI 20.82 kg/m   Results for orders placed or performed in visit on 11/10/16  POCT urinalysis dipstick  Result Value Ref Range   Color, UA straw (A) yellow   Clarity, UA clear clear   Glucose, UA negative negative mg/dL   Bilirubin, UA small (A) negative   Ketones, POC UA negative negative mg/dL   Spec Grav, UA 1.025 1.010 - 1.025   Blood, UA negative negative   pH, UA 6.0 5.0 - 8.0   Protein Ur, POC negative negative mg/dL   Urobilinogen, UA 1.0 0.2 or 1.0 E.U./dL   Nitrite, UA Negative Negative   Leukocytes, UA Negative Negative  POCT glycosylated hemoglobin (Hb A1C)  Result Value Ref Range   Hemoglobin A1C 6.1     Assessment & Plan:  Sched for AWV in 4 mos Thyroid panel, a1c, cmp, cbc, ua  1. Type 2 diabetes mellitus without complication, without long-term current use of insulin  (HCC) - doing much better, a1c down to 6.1 today. Cont diet, lifestyle management  2. Abnormal TSH - monitor labs periodically, no further f/u needed unless sig abnml  3. Essential hypertension - start losartan 25  4. Tobacco user - under to much stress to do more than cont to try to cut down currently, precontemplative as far as quitting fully right now.  5. Age related osteoporosis, unspecified pathological fracture presence - get copy of dexa from [facility] - had repeat in the past 2 years  6. Hyperlipidemia with target LDL less than 100 - at goal, cont lipitor  7. Periodic limb movement sleep disorder   8. Mild obstructive sleep apnea   9. Centrilobular emphysema ([place]) - new diagnosis seen on chest CT. agrees to do spirometry at next OV  Keep paper next to bed to write down thoughts keeping her awake. Try benadryl, melatonin, or sleepytime tea.  Orders Placed This Encounter  Procedures  . Comprehensive metabolic panel  . CBC  . Thyroid Panel With TSH  . POCT urinalysis dipstick  . POCT glycosylated hemoglobin (Hb A1C)    Meds ordered this encounter  Medications  . losartan (COZAAR) 25 MG tablet    Sig: Take 1 tablet (25 mg total) by mouth daily.    Dispense:  90 tablet    Refill:  0    Delman Cheadle, M.D.  Primary Care at [facility] [address] [place], [place] [address] [phone] phone [phone] fax  11/10/16 8:44 AM

## 2016-11-11 LAB — THYROID PANEL WITH TSH
Free Thyroxine Index: 2.1 (ref 1.2–4.9)
T3 Uptake Ratio: 29 % (ref 24–39)
T4 TOTAL: 7.3 ug/dL (ref 4.5–12.0)
TSH: 0.34 u[IU]/mL — ABNORMAL LOW (ref 0.450–4.500)

## 2016-11-11 LAB — COMPREHENSIVE METABOLIC PANEL
ALT: 13 IU/L (ref 0–32)
AST: 17 IU/L (ref 0–40)
Albumin/Globulin Ratio: 1.8 (ref 1.2–2.2)
Albumin: 4.3 g/dL (ref 3.5–4.8)
Alkaline Phosphatase: 82 IU/L (ref 39–117)
BUN/Creatinine Ratio: 18 (ref 12–28)
BUN: 13 mg/dL (ref 8–27)
Bilirubin Total: 0.5 mg/dL (ref 0.0–1.2)
CALCIUM: 9.3 mg/dL (ref 8.7–10.3)
CO2: 24 mmol/L (ref 20–29)
Chloride: 107 mmol/L — ABNORMAL HIGH (ref 96–106)
Creatinine, Ser: 0.74 mg/dL (ref 0.57–1.00)
GFR, EST AFRICAN AMERICAN: 94 mL/min/{1.73_m2} (ref >59)
GFR, EST NON AFRICAN AMERICAN: 82 mL/min/{1.73_m2} (ref >59)
GLUCOSE: 118 mg/dL — AB (ref 65–99)
Globulin, Total: 2.4 g/dL (ref 1.5–4.5)
Potassium: 4.3 mmol/L (ref 3.5–5.2)
Sodium: 144 mmol/L (ref 134–144)
TOTAL PROTEIN: 6.7 g/dL (ref 6.0–8.5)

## 2016-11-11 LAB — CBC
HEMATOCRIT: 42.5 % (ref 34.0–46.6)
HEMOGLOBIN: 13.8 g/dL (ref 11.1–15.9)
MCH: 31.7 pg (ref 26.6–33.0)
MCHC: 32.5 g/dL (ref 31.5–35.7)
MCV: 98 fL — AB (ref 79–97)
Platelets: 299 10*3/uL (ref 150–379)
RBC: 4.36 x10E6/uL (ref 3.77–5.28)
RDW: 14.4 % (ref 12.3–15.4)
WBC: 7.4 10*3/uL (ref 3.4–10.8)

## 2017-01-13 ENCOUNTER — Encounter: Payer: Self-pay | Admitting: Physician Assistant

## 2017-01-13 ENCOUNTER — Ambulatory Visit (INDEPENDENT_AMBULATORY_CARE_PROVIDER_SITE_OTHER): Payer: PPO | Admitting: Physician Assistant

## 2017-01-13 VITALS — BP 149/76 | HR 83 | Temp 97.1°F | Resp 18 | Ht 66.0 in | Wt 130.8 lb

## 2017-01-13 DIAGNOSIS — E119 Type 2 diabetes mellitus without complications: Secondary | ICD-10-CM

## 2017-01-13 DIAGNOSIS — R05 Cough: Secondary | ICD-10-CM

## 2017-01-13 DIAGNOSIS — J069 Acute upper respiratory infection, unspecified: Secondary | ICD-10-CM

## 2017-01-13 DIAGNOSIS — I10 Essential (primary) hypertension: Secondary | ICD-10-CM | POA: Diagnosis not present

## 2017-01-13 DIAGNOSIS — R059 Cough, unspecified: Secondary | ICD-10-CM

## 2017-01-13 MED ORDER — GUAIFENESIN-CODEINE 100-10 MG/5ML PO SOLN: 5.0000 mL | Freq: Three times a day (TID) | ORAL | 0 refills | Status: DC | PRN

## 2017-01-13 MED ORDER — GUAIFENESIN ER 1200 MG PO TB12: 1.0000 | ORAL_TABLET | Freq: Two times a day (BID) | ORAL | 0 refills | Status: AC

## 2017-01-13 MED ORDER — BENZONATATE 100 MG PO CAPS: ORAL_CAPSULE | ORAL | 0 refills | Status: DC

## 2017-01-13 NOTE — Patient Instructions (Signed)
     IF you received an x-ray today, you will receive an invoice from [facility]. Please contact [facility] at [phone] with questions or concerns regarding your invoice.   IF you received labwork today, you will receive an invoice from [facility]. Please contact [facility] at [phone] with questions or concerns regarding your invoice.   Our billing staff will not be able to assist you with questions regarding bills from these companies.  You will be contacted with the lab results as soon as they are available. The fastest way to get your results is to activate your My Chart account. Instructions are located on the last page of this paperwork. If you have not heard from us regarding the results in 2 weeks, please contact this office.     

## 2017-01-13 NOTE — Progress Notes (Signed)
Terry Armstrong  MRN: [id number] DOB: February 08, 1946  PCP: Shawnee Knapp, MD  Chief Complaint  Patient presents with  . URI    pt sprayed for weeds friday and woke up saturday was a cough and pt states it is worse at night   . Cough    white mucus     Subjective:  Pt presents to clinic for chest congestion and nasal congestion that started 4 days ago.  5 days ago she used weed spray in her yard and then she woke up the next day and had her current symptoms.  She is feeling like it getting worse.  She is coughing more at night - it is dry cough.  She has clear rhinorrhea.  She has seasonal allergies but this feels different than her normal allergies.  She has tried Human resources officer but it is not really helping her current symptoms.  She has been using some delsym and sudafed for her symptoms.  History is obtained by patient.  Review of Systems  Constitutional: Negative for chills and fever.  HENT: Positive for congestion, postnasal drip and rhinorrhea (clear). Negative for sore throat.   Respiratory: Positive for cough. Negative for shortness of breath and wheezing.   Gastrointestinal: Negative.   Musculoskeletal: Negative for myalgias.  Neurological: Negative for dizziness and headaches.  Psychiatric/Behavioral: Positive for sleep disturbance (2nd to cough).    Patient Active Problem List   Diagnosis Date Noted  . Centrilobular emphysema ([place]) 11/09/2016  . Periodic limb movement sleep disorder 01/11/2015  . Abnormal TSH 01/11/2015  . Upper airway resistance syndrome 01/11/2015  . Mild obstructive sleep apnea 01/11/2015  . Osteoporosis 01/10/2015  . Non-restorative sleep 05/23/2013  . Primary snoring   . HTN (hypertension) 05/11/2013  . Left breast abscess 11/30/2012  . DM (diabetes mellitus) ([place]) 06/04/2011  . Tobacco user 06/04/2011  . Hyperlipidemia with target LDL less than 100 06/04/2011    Current Outpatient Prescriptions on File Prior to Visit  Medication Sig Dispense  Refill  . Albuterol Sulfate (PROAIR RESPICLICK) 195 (90 Base) MCG/ACT AEPB Inhale 2 puffs into the lungs every 4 (four) hours as needed. 1 each 1  . alendronate (FOSAMAX) 70 MG tablet Take 1 tablet (70 mg total) by mouth every 7 (seven) days. Take with a full glass of water on an empty stomach. 12 tablet 3  . atorvastatin (LIPITOR) 40 MG tablet Take 1 tablet (40 mg total) by mouth daily. 90 tablet 3  . ipratropium (ATROVENT) 0.03 % nasal spray Place 2 sprays into the nose 4 (four) times daily. 30 mL 1  . losartan (COZAAR) 25 MG tablet Take 1 tablet (25 mg total) by mouth daily. 90 tablet 0  . multivitamin-iron-minerals-folic acid (CENTRUM) chewable tablet Chew 1 tablet by mouth daily.    . Omega-3 Fatty Acids (FISH OIL) 1200 MG CAPS Take 1 capsule (1,200 mg total) by mouth 2 (two) times daily. 60 capsule 3   No current facility-administered medications on file prior to visit.     Allergies  Allergen Reactions  . Aspirin     Swelling, scratching in throat    Past Medical History:  Diagnosis Date  . Breast abscess   . Diabetes mellitus without complication ([place])   . Hyperlipidemia   . Primary snoring    Social History   Social History Narrative   Patient lives with her significant other.   Patient is retired.   Patient drinks 2-3 cups of caffeine daily in the winter.   Patient  has a Financial risk analyst.   Patient is right-handed.         Social History  Substance Use Topics  . Smoking status: Current Every Day Smoker    Packs/day: 1.00    Years: 34.00    Types: Cigarettes  . Smokeless tobacco: Never Used  . Alcohol use Yes     Comment: social   family history includes Cancer in her maternal grandmother; Diabetes in her father, maternal aunt, maternal aunt, mother, and unknown relative; Heart Problems in her maternal grandmother; Heart disease in her maternal grandmother and sister; Hyperlipidemia in her unknown relative; Hypertension in her maternal aunt, maternal  grandmother, and sister; Kidney failure in her maternal aunt.     Objective:  BP (!) 149/76   Pulse 83   Temp (!) 97.1 F (36.2 C) (Oral)   Resp 18   Ht 5\' 6"  (1.676 m)   Wt 130 lb 12.8 oz (59.3 kg)   SpO2 100%   BMI 21.11 kg/m  Body mass index is 21.11 kg/m.  Physical Exam  Constitutional: She is oriented to person, place, and time and well-developed, well-nourished, and in no distress.  HENT:  Head: Normocephalic and atraumatic.  Right Ear: Hearing, tympanic membrane, external ear and ear canal normal.  Left Ear: Hearing, tympanic membrane, external ear and ear canal normal.  Nose: Mucosal edema present. No rhinorrhea.  Mouth/Throat: Uvula is midline, oropharynx is clear and moist and mucous membranes are normal.  Eyes: Conjunctivae are normal.  Neck: Normal range of motion.  Cardiovascular: Normal rate, regular rhythm and normal heart sounds.   No murmur heard. Pulmonary/Chest: Effort normal and breath sounds normal.  Neurological: She is alert and oriented to person, place, and time. Gait normal.  Skin: Skin is warm and dry.  Psychiatric: Mood, memory, affect and judgment normal.  Vitals reviewed.   Assessment and Plan :  Viral upper respiratory tract infection - Plan: Guaifenesin (MUCINEX MAXIMUM STRENGTH) 1200 MG TB12  Cough - Plan: benzonatate (TESSALON) 100 MG capsule, guaiFENesin-codeine 100-10 MG/5ML syrup  Type 2 diabetes mellitus without complication, without long-term current use of insulin (HCC)  Essential hypertension   I suspect the patient has a cold with likely increase symptoms due to irritant from weed spray.  Symptomatic treatment at this time along with the above medications.  Be mindful of sudafed containing OTC medications with her HTN.  Be mindful of possible increase in glucose with cough syrup but she plans on just taking that at night.  Windell Hummingbird PA-C  Primary Care at [facility] 01/14/2017 12:01 PM

## 2017-01-23 ENCOUNTER — Ambulatory Visit (INDEPENDENT_AMBULATORY_CARE_PROVIDER_SITE_OTHER): Payer: PPO | Admitting: Family Medicine

## 2017-01-23 ENCOUNTER — Other Ambulatory Visit: Payer: Self-pay | Admitting: Physician Assistant

## 2017-01-23 ENCOUNTER — Encounter: Payer: Self-pay | Admitting: Family Medicine

## 2017-01-23 VITALS — BP 129/71 | HR 89 | Temp 98.0°F | Resp 18 | Ht 65.5 in | Wt 131.0 lb

## 2017-01-23 DIAGNOSIS — R05 Cough: Secondary | ICD-10-CM

## 2017-01-23 DIAGNOSIS — R058 Other specified cough: Secondary | ICD-10-CM

## 2017-01-23 DIAGNOSIS — R059 Cough, unspecified: Secondary | ICD-10-CM

## 2017-01-23 MED ORDER — BENZONATATE 100 MG PO CAPS: ORAL_CAPSULE | ORAL | 0 refills | Status: AC

## 2017-01-23 MED ORDER — PREDNISONE 20 MG PO TABS: ORAL_TABLET | ORAL | 0 refills | Status: DC

## 2017-01-23 NOTE — Patient Instructions (Addendum)
Take prednisone 20 mg 3 pills daily for 2 days, then 2 daily for 2 days, then 1 daily for 2 days. Best taken after breakfast. Prednisone may increase your blood sugar, but it should go right back down after finishing the course.   Drink plenty of fluids  Use the benzonatate cough pills one or 2 pills 3 times daily if needed for cough  Get your cough syrup refill and use it as needed.  Return if not improving.    IF you received an x-ray today, you will receive an invoice from [facility]. Please contact [facility] at [phone] with questions or concerns regarding your invoice.   IF you received labwork today, you will receive an invoice from [place]. Please contact [facility] at [phone] with questions or concerns regarding your invoice.   Our billing staff will not be able to assist you with questions regarding bills from these companies.  You will be contacted with the lab results as soon as they are available. The fastest way to get your results is to activate your My Chart account. Instructions are located on the last page of this paperwork. If you have not heard from [place] regarding the results in 2 weeks, please contact this office.

## 2017-01-23 NOTE — Progress Notes (Signed)
Patient ID: Terry Armstrong, female    DOB: May 13, 1945  Age: 71 y.o. MRN: [id number]  Chief Complaint  Patient presents with  . Cough  . URI    Subjective:   Patient has continued to have a cough for the last month. She wheezes a little bit at times, but does not big issue.. She does need to smoke, not smoking as much is her usual pack a day. We talked a long time about stopping smoking again. The benzonatate helped a little bit but not a lot. She has had problems with coughing fits in church and during a pill recently. She also has a little nighttime cough. This is generally nonproductive, a whitish clear phlegm at times. No upper respiratory stuff.  Current allergies, medications, problem list, past/family and social histories reviewed.  Objective:  BP 129/71   Pulse 89   Temp 98 F (36.7 C) (Oral)   Resp 18   Ht 5' 5.5" (1.664 m)   Wt 131 lb (59.4 kg)   SpO2 98%   BMI 21.47 kg/m   TMs normal. Throat clear. Neck supple without nodes. Chest is clear to auscultation. Heart regular without murmurs. Force expiration triggers a little cough.  Assessment & Plan:   Assessment: 1. Post-viral cough syndrome   2. Cough       Plan: She is diabetic but off medications. I think she can tolerate a course of prednisone. She instructions.  No orders of the defined types were placed in this encounter.   Meds ordered this encounter  Medications  . benzonatate (TESSALON) 100 MG capsule    Sig: 1-2 po tid prn cough    Dispense:  40 capsule    Refill:  0  . predniSONE (DELTASONE) 20 MG tablet    Sig: Take 3 daily for 2 days, then 2 daily for 2 days, then 1 daily for 2 days. Best taken after breakfast.    Dispense:  12 tablet    Refill:  0         Patient Instructions   Take prednisone 20 mg 3 pills daily for 2 days, then 2 daily for 2 days, then 1 daily for 2 days. Best taken after breakfast. Prednisone may increase your blood sugar, but it should go right back down after  finishing the course.   Drink plenty of fluids  Use the benzonatate cough pills one or 2 pills 3 times daily if needed for cough  Get your cough syrup refill and use it as needed.  Return if not improving.    IF you received an x-ray today, you will receive an invoice from [facility]. Please contact [facility] at [phone] with questions or concerns regarding your invoice.   IF you received labwork today, you will receive an invoice from [place]. Please contact [facility] at [phone] with questions or concerns regarding your invoice.   Our billing staff will not be able to assist you with questions regarding bills from these companies.  You will be contacted with the lab results as soon as they are available. The fastest way to get your results is to activate your My Chart account. Instructions are located on the last page of this paperwork. If you have not heard from [place] regarding the results in 2 weeks, please contact this office.         Return if symptoms worsen or fail to improve.   Veroncia Jezek, MD 01/23/2017

## 2017-02-04 ENCOUNTER — Other Ambulatory Visit: Payer: Self-pay | Admitting: Physician Assistant

## 2017-02-04 ENCOUNTER — Other Ambulatory Visit: Payer: Self-pay | Admitting: Family Medicine

## 2017-02-04 DIAGNOSIS — R059 Cough, unspecified: Secondary | ICD-10-CM

## 2017-02-04 DIAGNOSIS — R058 Other specified cough: Secondary | ICD-10-CM

## 2017-02-04 DIAGNOSIS — R05 Cough: Secondary | ICD-10-CM

## 2017-02-05 NOTE — Telephone Encounter (Signed)
Please advise 

## 2017-02-05 NOTE — Telephone Encounter (Signed)
Done

## 2017-03-03 ENCOUNTER — Ambulatory Visit (INDEPENDENT_AMBULATORY_CARE_PROVIDER_SITE_OTHER): Payer: PPO

## 2017-03-03 VITALS — BP 122/80 | HR 93 | Temp 98.1°F | Ht 66.0 in | Wt 128.4 lb

## 2017-03-03 DIAGNOSIS — Z0000 Encounter for general adult medical examination without abnormal findings: Secondary | ICD-10-CM | POA: Diagnosis not present

## 2017-03-03 DIAGNOSIS — M810 Age-related osteoporosis without current pathological fracture: Secondary | ICD-10-CM | POA: Diagnosis not present

## 2017-03-03 DIAGNOSIS — Z23 Encounter for immunization: Secondary | ICD-10-CM | POA: Diagnosis not present

## 2017-03-03 NOTE — Patient Instructions (Addendum)
Terry Armstrong , Thank you for taking time to come for your Medicare Wellness Visit. I appreciate your ongoing commitment to your health goals. Please review the following plan we discussed and let me know if I can assist you in the future.   Screening recommendations/referrals: Colonoscopy: up to date, next due 12/20/2018 Mammogram: up to date, next due 09/19/2018 Bone Density: due, Order sent to [facility] and they will call and schedule with you.  Recommended yearly ophthalmology/optometry visit for glaucoma screening and checkup Recommended yearly dental visit for hygiene and checkup  Vaccinations: Influenza vaccine: administered today Pneumococcal vaccine: up to date Tdap vaccine: up to date, next due 10/12/2018 Shingles vaccine: up to date    Advanced directives: Advance directive discussed with you today. I have provided a copy for you to complete at home and have notarized. Once this is complete please bring a copy in to our office so we can scan it into your chart.   Conditions/risks identified: Try to quit smoking in the near future.   Next appointment: 03/16/17 @ 8 am with Dr. Brigitte Pulse    Preventive Care 65 Years and Older, Female Preventive care refers to lifestyle choices and visits with your health care provider that can promote health and wellness. What does preventive care include?  A yearly physical exam. This is also called an annual well check.  Dental exams once or twice a year.  Routine eye exams. Ask your health care provider how often you should have your eyes checked.  Personal lifestyle choices, including:  Daily care of your teeth and gums.  Regular physical activity.  Eating a healthy diet.  Avoiding tobacco and drug use.  Limiting alcohol use.  Practicing safe sex.  Taking low-dose aspirin every day.  Taking vitamin and mineral supplements as recommended by your health care provider. What happens during an annual well check? The services and screenings  done by your health care provider during your annual well check will depend on your age, overall health, lifestyle risk factors, and family history of disease. Counseling  Your health care provider may ask you questions about your:  Alcohol use.  Tobacco use.  Drug use.  Emotional well-being.  Home and relationship well-being.  Sexual activity.  Eating habits.  History of falls.  Memory and ability to understand (cognition).  Work and work Statistician.  Reproductive health. Screening  You may have the following tests or measurements:  Height, weight, and BMI.  Blood pressure.  Lipid and cholesterol levels. These may be checked every 5 years, or more frequently if you are over 42 years old.  Skin check.  Lung cancer screening. You may have this screening every year starting at age 73 if you have a 30-pack-year history of smoking and currently smoke or have quit within the past 15 years.  Fecal occult blood test (FOBT) of the stool. You may have this test every year starting at age 26.  Flexible sigmoidoscopy or colonoscopy. You may have a sigmoidoscopy every 5 years or a colonoscopy every 10 years starting at age 42.  Hepatitis C blood test.  Hepatitis B blood test.  Sexually transmitted disease (STD) testing.  Diabetes screening. This is done by checking your blood sugar (glucose) after you have not eaten for a while (fasting). You may have this done every 1-3 years.  Bone density scan. This is done to screen for osteoporosis. You may have this done starting at age 61.  Mammogram. This may be done every 1-2 years. Talk  to your health care provider about how often you should have regular mammograms. Talk with your health care provider about your test results, treatment options, and if necessary, the need for more tests. Vaccines  Your health care provider may recommend certain vaccines, such as:  Influenza vaccine. This is recommended every year.  Tetanus,  diphtheria, and acellular pertussis (Tdap, Td) vaccine. You may need a Td booster every 10 years.  Zoster vaccine. You may need this after age 59.  Pneumococcal 13-valent conjugate (PCV13) vaccine. One dose is recommended after age 64.  Pneumococcal polysaccharide (PPSV23) vaccine. One dose is recommended after age 44. Talk to your health care provider about which screenings and vaccines you need and how often you need them. This information is not intended to replace advice given to you by your health care provider. Make sure you discuss any questions you have with your health care provider. Document Released: 05/18/2015 Document Revised: 01/09/2016 Document Reviewed: 02/20/2015 Elsevier Interactive Patient Education  2017 [place] Prevention in the Home Falls can cause injuries. They can happen to people of all ages. There are many things you can do to make your home safe and to help prevent falls. What can I do on the outside of my home?  Regularly fix the edges of walkways and driveways and fix any cracks.  Remove anything that might make you trip as you walk through a door, such as a raised step or threshold.  Trim any bushes or trees on the path to your home.  Use bright outdoor lighting.  Clear any walking paths of anything that might make someone trip, such as rocks or tools.  Regularly check to see if handrails are loose or broken. Make sure that both sides of any steps have handrails.  Any raised decks and porches should have guardrails on the edges.  Have any leaves, snow, or ice cleared regularly.  Use sand or salt on walking paths during winter.  Clean up any spills in your garage right away. This includes oil or grease spills. What can I do in the bathroom?  Use night lights.  Install grab bars by the toilet and in the tub and shower. Do not use towel bars as grab bars.  Use non-skid mats or decals in the tub or shower.  If you need to sit down in  the shower, use a plastic, non-slip stool.  Keep the floor dry. Clean up any water that spills on the floor as soon as it happens.  Remove soap buildup in the tub or shower regularly.  Attach bath mats securely with double-sided non-slip rug tape.  Do not have throw rugs and other things on the floor that can make you trip. What can I do in the bedroom?  Use night lights.  Make sure that you have a light by your bed that is easy to reach.  Do not use any sheets or blankets that are too big for your bed. They should not hang down onto the floor.  Have a firm chair that has side arms. You can use this for support while you get dressed.  Do not have throw rugs and other things on the floor that can make you trip. What can I do in the kitchen?  Clean up any spills right away.  Avoid walking on wet floors.  Keep items that you use a lot in easy-to-reach places.  If you need to reach something above you, use a strong step stool that  has a grab bar.  Keep electrical cords out of the way.  Do not use floor polish or wax that makes floors slippery. If you must use wax, use non-skid floor wax.  Do not have throw rugs and other things on the floor that can make you trip. What can I do with my stairs?  Do not leave any items on the stairs.  Make sure that there are handrails on both sides of the stairs and use them. Fix handrails that are broken or loose. Make sure that handrails are as long as the stairways.  Check any carpeting to make sure that it is firmly attached to the stairs. Fix any carpet that is loose or worn.  Avoid having throw rugs at the top or bottom of the stairs. If you do have throw rugs, attach them to the floor with carpet tape.  Make sure that you have a light switch at the top of the stairs and the bottom of the stairs. If you do not have them, ask someone to add them for you. What else can I do to help prevent falls?  Wear shoes that:  Do not have high  heels.  Have rubber bottoms.  Are comfortable and fit you well.  Are closed at the toe. Do not wear sandals.  If you use a stepladder:  Make sure that it is fully opened. Do not climb a closed stepladder.  Make sure that both sides of the stepladder are locked into place.  Ask someone to hold it for you, if possible.  Clearly mark and make sure that you can see:  Any grab bars or handrails.  First and last steps.  Where the edge of each step is.  Use tools that help you move around (mobility aids) if they are needed. These include:  Canes.  Walkers.  Scooters.  Crutches.  Turn on the lights when you go into a dark area. Replace any light bulbs as soon as they burn out.  Set up your furniture so you have a clear path. Avoid moving your furniture around.  If any of your floors are uneven, fix them.  If there are any pets around you, be aware of where they are.  Review your medicines with your doctor. Some medicines can make you feel dizzy. This can increase your chance of falling. Ask your doctor what other things that you can do to help prevent falls. This information is not intended to replace advice given to you by your health care provider. Make sure you discuss any questions you have with your health care provider. Document Released: 02/15/2009 Document Revised: 09/27/2015 Document Reviewed: 05/26/2014 Elsevier Interactive Patient Education  2017 [employer].

## 2017-03-03 NOTE — Progress Notes (Signed)
Subjective:   Terry Armstrong is a 71 y.o. female who presents for Medicare Annual (Subsequent) preventive examination.  Review of Systems:  N/A        Objective:     Vitals: BP 122/80   Pulse 93   Temp 98.1 F (36.7 C) (Oral)   Ht 5\' 6"  (1.676 m)   Wt 128 lb 6 oz (58.2 kg)   SpO2 97%   BMI 20.72 kg/m   Body mass index is 20.72 kg/m.   Tobacco History  Smoking Status  . Current Every Day Smoker  . Packs/day: 1.00  . Years: 34.00  . Types: Cigarettes  Smokeless Tobacco  . Never Used     Ready to quit: Not Answered Counseling given: Not Answered   Past Medical History:  Diagnosis Date  . Breast abscess   . Diabetes mellitus without complication ([place])   . Hyperlipidemia   . Primary snoring    Past Surgical History:  Procedure Laterality Date  . ABDOMINAL HYSTERECTOMY  1993   Family History  Problem Relation Age of Onset  . Diabetes Mother   . Diabetes Father   . Diabetes Unknown   . Hypertension Sister   . Hyperlipidemia Unknown   . Heart disease Sister        defibrillator  . Kidney failure Maternal Aunt        dialysis  . Diabetes Maternal Aunt   . Hypertension Maternal Aunt   . Diabetes Maternal Aunt   . Heart Problems Maternal Grandmother   . Cancer Maternal Grandmother   . Heart disease Maternal Grandmother   . Hypertension Maternal Grandmother    History  Sexual Activity  . Sexual activity: Not on file    Outpatient Encounter Prescriptions as of 03/03/2017  Medication Sig  . Albuterol Sulfate (PROAIR RESPICLICK) 539 (90 Base) MCG/ACT AEPB Inhale 2 puffs into the lungs every 4 (four) hours as needed.  Marland Kitchen alendronate (FOSAMAX) 70 MG tablet Take 1 tablet (70 mg total) by mouth every 7 (seven) days. Take with a full glass of water on an empty stomach.  Marland Kitchen atorvastatin (LIPITOR) 40 MG tablet Take 1 tablet (40 mg total) by mouth daily.  Marland Kitchen ipratropium (ATROVENT) 0.03 % nasal spray Place 2 sprays into the nose 4 (four) times daily.  Marland Kitchen losartan  (COZAAR) 25 MG tablet Take 1 tablet (25 mg total) by mouth daily.  . multivitamin-iron-minerals-folic acid (CENTRUM) chewable tablet Chew 1 tablet by mouth daily.  . Omega-3 Fatty Acids (FISH OIL) 1200 MG CAPS Take 1 capsule (1,200 mg total) by mouth 2 (two) times daily.  . predniSONE (DELTASONE) 20 MG tablet Take 3 daily for 2 days, then 2 daily for 2 days, then 1 daily for 2 days. Best taken after breakfast.  . VIRTUSSIN A/C 100-10 MG/5ML syrup TAKE 5 MLS BY MOUTH THREE TIMES DAILY AS NEEDED FOR COUGH   No facility-administered encounter medications on file as of 03/03/2017.     Activities of Daily Living In your present state of health, do you have any difficulty performing the following activities: 01/23/2017  Hearing? N  Vision? N  Difficulty concentrating or making decisions? N  Walking or climbing stairs? N  Dressing or bathing? N  Doing errands, shopping? N  Some recent data might be hidden    Patient Care Team: Shawnee Knapp, MD as PCP - General (Family Medicine) Clent Jacks, MD as Consulting Physician (Ophthalmology) Elayne Snare, MD as Consulting Physician (Endocrinology)    Assessment:  Exercise Activities and Dietary recommendations    Goals    None     Fall Risk Fall Risk  01/23/2017 01/13/2017 11/10/2016 08/04/2016 04/04/2016  Falls in the past year? No No No No No  Number falls in past yr: - - - - -  Injury with Fall? - - - - -   Depression Screen PHQ 2/9 Scores 01/23/2017 01/13/2017 11/10/2016 08/04/2016  PHQ - 2 Score 0 0 0 0  PHQ- 9 Score - - - -     Cognitive Function        Immunization History  Administered Date(s) Administered  . Influenza Split 01/28/2012  . Influenza, High Dose Seasonal PF 06/20/2016  . Influenza,inj,Quad PF,6+ Mos 04/08/2013, 04/15/2014, 01/11/2015  . Pneumococcal Conjugate-13 07/26/2015  . Pneumococcal Polysaccharide-23 11/14/2010  . Tdap 10/11/2008  . Zoster 05/05/2014   Screening Tests Health Maintenance  Topic Date Due   . INFLUENZA VACCINE  12/03/2016  . HEMOGLOBIN A1C  05/13/2017  . OPHTHALMOLOGY EXAM  06/17/2017  . FOOT EXAM  08/04/2017  . MAMMOGRAM  09/19/2018  . TETANUS/TDAP  10/12/2018  . COLONOSCOPY  12/20/2018  . DEXA SCAN  Completed  . Hepatitis C Screening  Completed  . PNA vac Low Risk Adult  Completed      Plan:   I have personally reviewed and noted the following in the patient's chart:   . Medical and social history . Use of alcohol, tobacco or illicit drugs  . Current medications and supplements . Functional ability and status . Nutritional status . Physical activity . Advanced directives . List of other physicians . Hospitalizations, surgeries, and ER visits in previous 12 months . Vitals . Screenings to include cognitive, depression, and falls . Referrals and appointments  In addition, I have reviewed and discussed with patient certain preventive protocols, quality metrics, and best practice recommendations. A written personalized care plan for preventive services as well as general preventive health recommendations were provided to patient.   Bone density ordered in system.  Andrez Grime, LPN  16/02/[id number]

## 2017-03-15 DIAGNOSIS — E042 Nontoxic multinodular goiter: Secondary | ICD-10-CM | POA: Insufficient documentation

## 2017-03-15 NOTE — Progress Notes (Signed)
Subjective:    Patient ID: Terry Armstrong, female    DOB: [birth year], 71 y.o.   MRN: [id number] Chief Complaint  Patient presents with  . Annual Exam  . Diabetes  . Hyperlipidemia    HPI  Terry Armstrong is a delightful 71 yo woman who is here today for her complete physical and follow-up on her diabetes for which I last saw her 4 mos prior.  Primary Preventative Screenings: Cervical Cancer: NA - -s/p abdominal hysterectomy in 1993 in [place] for recurrent miscarriages, menses problems, no cancer. Removed cervix but left in ovaries. STI screening: neg Hep C 01/2015 Breast Cancer: 09/18/16 mammogram at [facility] Colorectal Cancer: 12/19/2008 w/ Dr. Nicole Kindred - small polyp - repeat in 5 years. Tobacco use/EtOH/substances: Bone Density: Osteoporosis on fosamax since 05/2013 when dx'd w/ dexa at [facility]. Last dexa bone scan ~2017 at [facility] we think but still waiting to get copy - to know if any change since starting fosamax. Does take calcium once a day Cardiac: CAD - on statin, fish oil, seen prev by Dr. Debara Pickett with nml POET 06/01/2013 Weight/Blood sugar/Diet/Exercise: BMI Readings from Last 3 Encounters:  03/03/17 20.72 kg/m  01/23/17 21.47 kg/m  01/13/17 21.11 kg/m   Lab Results  Component Value Date   HGBA1C 6.1 11/10/2016   OTC/Vit/Supp/Herbal: Dentist/Optho: Sees Dr. Katy Fitch annually - last 06/17/16 Immunizations:  Immunization History  Administered Date(s) Administered  . Influenza Split 01/28/2012  . Influenza, High Dose Seasonal PF 06/20/2016  . Influenza,inj,Quad PF,6+ Mos 04/08/2013, 04/15/2014, 01/11/2015, 03/03/2017  . Pneumococcal Conjugate-13 07/26/2015  . Pneumococcal Polysaccharide-23 11/14/2010  . Tdap 10/11/2008  . Zoster 05/05/2014     Chronic Medical Conditions: DMII: Diagnosed .   Lab Results  Component Value Date   HGBA1C 6.1 11/10/2016   HGBA1C 6.5 08/04/2016   HGBA1C 6.1 (H) 07/26/2015   CBGs: does not check cbgsl fasting a.m. ; after meal  ; No hypoglycemic  episodes.  Meter type: from [facility] Diet: eats well as has to cook for husband and brother Exercising: no formal - keeps very busy  DM Med Regimen: diet/exercise, no med Prior changes: stopped metformin due to lifestyle change/weightloss was effective  eGFR: 94 Baseline Cr: 0.74 Last checked 11/10/2016. Microalb: Normal 08/04/2016. Started losartan 25 last visit 11/10/16. Lipids:  LDL ,  non-HDL .  Last levels done  - were improving from prior. On statin. Does not take an asa due to asa allergy with swelling/throat scratching.  Optho: Seen annually by Dr. Katy Fitch - last exam 06/17/2016 Feet: Monofilament exam done 08/04/2016. Denies any no problems.  Not seen by podiatry prior.  Immunizations:  Influenza: UTD 03/03/2017 and annually  Pneumovax-23: done at 71 yo  HTN: started losartan 25 at last visit but now is off MAR - appears that [place] stopped it at the [facility] 2 wks ago. . . .  ; prev well controlled with lifestyle. Does not check BP outside of the office though does have a  meter. Has been under a lot of stress caretaking of her whole house, her brother around the corner, and her husband. Does keep to a low salt diet though does not drink much water.  HLD goal <70: (newly decreased from LDL goal <100 since seen CAD on CT scan).on atorvastatin 40 and fish oil      Lab Results  Component Value Date   LDLCALC 92 08/04/2016   LDLCALC 148 (H) 07/26/2015   Goiter and abnml tsh - benign, saw Dr. Dwyane Dee 06/2016 with [place]  07/30/16, thyroid bx and [place] also done 08/09/2015. Monitor labs -> nuclear scan if becomes hyperthyroid  Osteoporosis - On fosamax since 05/2013 but forgot to take it for a while and then didn't restart as [facility] told her they didn't have a rx for her. Had a [facility] within the last 2 years. No ca/vit D supp. Prior vit D lvls nml      Lab Results  Component Value Date   VD25OH 43.4 08/04/2016   VD25OH 65 04/08/2013   Centrilobular emphysema with ongoing  Tobacco use- Had screening lung CT 09/03/2016 with benign nodules and did show centrilobular emphysema (new diagnosis). Smoking <3/4 ppd - often only smoking 5 cig/d. She did try Chantix several years ago but did not stop smoking within the month and it is to expensive to retry. Tried nicotine lozenges which worked for her husband. Smoking depends on her stress level and does not smoking when she wakes up in the morning but can goes hours without. However, under to much stress to really devote herself to cessation right now so just trying to cut down.   Insomnia - due to anxiety/stress with taking care of everyone - trying good sleep hygeine and alternate between melatonin, benadryl, sleepytime tea  Past Medical History:  Diagnosis Date  . Breast abscess   . Diabetes mellitus without complication ([place])   . Hyperlipidemia   . Primary snoring    Past Surgical History:  Procedure Laterality Date  . ABDOMINAL HYSTERECTOMY  1993   Current Outpatient Medications on File Prior to Visit  Medication Sig Dispense Refill  . Albuterol Sulfate (PROAIR RESPICLICK) 099 (90 Base) MCG/ACT AEPB Inhale 2 puffs into the lungs every 4 (four) hours as needed. 1 each 1  . atorvastatin (LIPITOR) 40 MG tablet Take 1 tablet (40 mg total) by mouth daily. 90 tablet 3  . multivitamin-iron-minerals-folic acid (CENTRUM) chewable tablet Chew 1 tablet by mouth daily.    . Omega-3 Fatty Acids (FISH OIL) 1200 MG CAPS Take 1 capsule (1,200 mg total) by mouth 2 (two) times daily. 60 capsule 3   No current facility-administered medications on file prior to visit.    Allergies  Allergen Reactions  . Aspirin     Swelling, scratching in throat   Family History  Problem Relation Age of Onset  . Diabetes Mother   . Diabetes Father   . Diabetes Unknown   . Hypertension Sister   . Hyperlipidemia Unknown   . Heart disease Sister        defibrillator  . Kidney failure Maternal Aunt        dialysis  . Diabetes Maternal Aunt    . Hypertension Maternal Aunt   . Diabetes Maternal Aunt   . Heart Problems Maternal Grandmother   . Cancer Maternal Grandmother   . Heart disease Maternal Grandmother   . Hypertension Maternal Grandmother   . Diabetes Brother    Social History   Socioeconomic History  . Marital status: Married    Spouse name: None  . Number of children: 0  . Years of education: 31  . Highest education level: None  Social Needs  . Financial resource strain: None  . Food insecurity - worry: None  . Food insecurity - inability: None  . Transportation needs - medical: None  . Transportation needs - non-medical: None  Occupational History    Employer: RETIRED  Tobacco Use  . Smoking status: Current Every Day Smoker    Packs/day: 1.00  Years: 34.00    Pack years: 34.00    Types: Cigarettes  . Smokeless tobacco: Never Used  . Tobacco comment: Patient states that she would love to quit smoking  Substance and Sexual Activity  . Alcohol use: Yes    Comment: social  . Drug use: No  . Sexual activity: None  Other Topics Concern  . None  Social History Narrative   Patient lives with her significant other.   Patient is retired.   Patient drinks 2-3 cups of caffeine daily in the winter.   Patient has a college education.   Patient is right-handed.         Depression screen [facility] 2/9 03/16/2017 03/03/2017 01/23/2017 01/13/2017 11/10/2016  Decreased Interest 0 0 0 0 0  Down, Depressed, Hopeless 0 0 0 0 0  PHQ - 2 Score 0 0 0 0 0  Altered sleeping - - - - -  Tired, decreased energy - - - - -  Change in appetite - - - - -  Feeling bad or failure about yourself  - - - - -  Trouble concentrating - - - - -  Moving slowly or fidgety/restless - - - - -  Suicidal thoughts - - - - -  PHQ-9 Score - - - - -    Review of Systems See hpi    Objective:   Physical Exam  Constitutional: She is oriented to person, place, and time. She appears well-developed and well-nourished. No distress.  HENT:    Head: Normocephalic and atraumatic.  Right Ear: Tympanic membrane, external ear and ear canal normal.  Left Ear: Tympanic membrane, external ear and ear canal normal.  Nose: Nose normal. No mucosal edema or rhinorrhea.  Mouth/Throat: Uvula is midline, oropharynx is clear and moist and mucous membranes are normal. No oropharyngeal exudate.  Eyes: Conjunctivae are normal. Right eye exhibits no discharge. Left eye exhibits no discharge. No scleral icterus.  Neck: Normal range of motion. Neck supple.  Cardiovascular: Normal rate, regular rhythm, normal heart sounds and intact distal pulses.  Pulmonary/Chest: Effort normal and breath sounds normal.  Abdominal: Soft. Bowel sounds are normal. She exhibits no distension and no mass. There is no tenderness. There is no rebound and no guarding.  Genitourinary: No breast swelling, tenderness, discharge or bleeding.  Genitourinary Comments: Few diffuse fibrocystic like pea masses throughout left central breast around areola - indefinable, can't isolate or reproducibly feel them, non-tender, no skin changes. Left axillary with <1 cm subcutaneous nodule w/ large central pore. Non-draining, no erythema, heat, tenderness, or fluctuance but does have a little induration still.  Lymphadenopathy:    She has no cervical adenopathy.  Neurological: She is alert and oriented to person, place, and time.  Skin: Skin is warm and dry. She is not diaphoretic. No erythema.  Psychiatric: She has a normal mood and affect. Her behavior is normal.   BP 120/74   Pulse 83   Temp 97.7 F (36.5 C)   Resp 16   Ht 5' 6"  (1.676 m)   Wt 129 lb (58.5 kg)   SpO2 100%   BMI 20.82 kg/m   Results for orders placed or performed in visit on 03/16/17  POCT urinalysis dipstick  Result Value Ref Range   Color, UA yellow yellow   Clarity, UA clear clear   Glucose, UA negative negative mg/dL   Bilirubin, UA negative negative   Ketones, POC UA negative negative mg/dL   Spec Grav,  UA 1.020 1.010 - 1.025  Blood, UA negative negative   pH, UA 6.0 5.0 - 8.0   Protein Ur, POC negative negative mg/dL   Urobilinogen, UA 1.0 0.2 or 1.0 E.U./dL   Nitrite, UA Negative Negative   Leukocytes, UA Negative Negative  POCT glycosylated hemoglobin (Hb A1C)  Result Value Ref Range   Hemoglobin A1C 6.5    See pre & post PFT pics in Media tab -   Assessment & Plan:  A1c, ua, tsh, lipid, cmp   Consider checking B12/folate if MCV still elevated on cbc - no B vitamins checked prior though MCV has always trended towards high-nml.   Defer Armstrong vaccine for sev more yrs since got zostavax ~2016  Left axillary abscess - spontaneously drained and healing - wet/warm compresses qid followed by topical abx - if recurs, RTC for I&D.  1. Annual physical exam   2. Screening for colorectal cancer - 3 yrs overdue for colonoscopy which had a 43yrrecall- will do cologuard instead as she will have a hard time w/ prep and scheduling considering how many people she takes care of - but pt is willing to do colonoscpoy if needed. I think either way (getting colonoscopy now or after + cologuard), it will be charged as a diagnostic due to the prior polyp.  3. Type 2 diabetes mellitus without complication, without long-term current use of insulin ([place]ulcifer - diet controlled - a1c increasing again so watch diet.  4. Essential hypertension - Didn't have a problem with losartan 25, she is not actually sure if she is taking it or not but doesn't think so but not sure why - was only 3 mo rx so may have ran out last mo. As pt is now w/ pre-dm and bp in nml range, will hold off on restarting at this point but Low threshold for starting acei/arb.   5. Hyperlipidemia LDL goal <70 - much improved - very close to goal. Cont atorvastatin 40. Recheck in 6 mos  6. Abnormal TSH  - Monitor thyroid blood tests q6-12 mos and proceed with nuclear scan if mild hyperthyroidism develops  7. Multinodular goiter   8. Age related  osteoporosis, unspecified pathological fracture presence - Have requested med records try to get copy of updated dexa bone scan from [facility] - they told pt she had one in 2017.  9. Tobacco user - pt cutting back; Repeat screening lung CT 09/2017  10. [place]  Today did Spirometry pre and post alb neb - not currently on inhalers though has prn albuterol which she rarely needs  pt w/ severe COPD but minimal improvement w/ inhaler. Improved on average about 20% and pt felt it a little easier to breathe but FEV1/FVC % only improved a few (~63-66%) so unsure if she would benefit from maintanence inhaler consider the cost since she has medicare - will do 1 mo free trial of Dulera to see if she notices feeling any better exercise tolerance - if so, ok to start any LABA/ICS maintanence.   11. Non-restorative sleep   12. Medication monitoring encounter     Orders Placed This Encounter  Procedures  . Lipid panel    Order Specific Question:   Has the patient fasted?    Answer:   Yes  . Comprehensive metabolic panel    Order Specific Question:   Has the patient fasted?    Answer:   Yes  . TSH  . CBC with Differential/Platelet  . Cologuard  . Care order/instruction:  Scheduling Instructions:     Spirometry IF NEB IS ORDERED PLEASE DO A SPIROMETRY BEFORE AND AFTER.  Marland Kitchen POCT urinalysis dipstick  . POCT glycosylated hemoglobin (Hb A1C)    Meds ordered this encounter  Medications  . albuterol (PROVENTIL) (2.5 MG/3ML) 0.083% nebulizer solution 2.5 mg  . DISCONTD: alendronate (FOSAMAX) 70 MG tablet    Sig: Take 1 tablet (70 mg total) every 7 (seven) days by mouth. Take with a full glass of water on an empty stomach.    Dispense:  12 tablet    Refill:  3  . alendronate (FOSAMAX) 70 MG tablet    Sig: Take 1 tablet (70 mg total) every 7 (seven) days by mouth. Take with a full glass of water on an empty stomach.    Dispense:  12 tablet    Refill:  3     Delman Cheadle, M.D.    Primary Care at [facility] [address] [place], [place] [address] [phone] phone [phone] fax  03/16/17 8:55 AM

## 2017-03-16 ENCOUNTER — Other Ambulatory Visit: Payer: Self-pay

## 2017-03-16 ENCOUNTER — Ambulatory Visit (INDEPENDENT_AMBULATORY_CARE_PROVIDER_SITE_OTHER): Payer: PPO | Admitting: Family Medicine

## 2017-03-16 ENCOUNTER — Encounter: Payer: Self-pay | Admitting: Family Medicine

## 2017-03-16 VITALS — BP 120/74 | HR 83 | Temp 97.7°F | Resp 16 | Ht 66.0 in | Wt 129.0 lb

## 2017-03-16 DIAGNOSIS — L02419 Cutaneous abscess of limb, unspecified: Secondary | ICD-10-CM | POA: Diagnosis not present

## 2017-03-16 DIAGNOSIS — M810 Age-related osteoporosis without current pathological fracture: Secondary | ICD-10-CM

## 2017-03-16 DIAGNOSIS — Z1211 Encounter for screening for malignant neoplasm of colon: Secondary | ICD-10-CM | POA: Diagnosis not present

## 2017-03-16 DIAGNOSIS — R7989 Other specified abnormal findings of blood chemistry: Secondary | ICD-10-CM | POA: Diagnosis not present

## 2017-03-16 DIAGNOSIS — E119 Type 2 diabetes mellitus without complications: Secondary | ICD-10-CM

## 2017-03-16 DIAGNOSIS — Z1212 Encounter for screening for malignant neoplasm of rectum: Secondary | ICD-10-CM | POA: Diagnosis not present

## 2017-03-16 DIAGNOSIS — Z5181 Encounter for therapeutic drug level monitoring: Secondary | ICD-10-CM | POA: Diagnosis not present

## 2017-03-16 DIAGNOSIS — Z720 Tobacco use: Secondary | ICD-10-CM | POA: Diagnosis not present

## 2017-03-16 DIAGNOSIS — Z0000 Encounter for general adult medical examination without abnormal findings: Secondary | ICD-10-CM

## 2017-03-16 DIAGNOSIS — G478 Other sleep disorders: Secondary | ICD-10-CM | POA: Diagnosis not present

## 2017-03-16 DIAGNOSIS — I10 Essential (primary) hypertension: Secondary | ICD-10-CM | POA: Diagnosis not present

## 2017-03-16 DIAGNOSIS — J432 Centrilobular emphysema: Secondary | ICD-10-CM | POA: Diagnosis not present

## 2017-03-16 DIAGNOSIS — E042 Nontoxic multinodular goiter: Secondary | ICD-10-CM

## 2017-03-16 DIAGNOSIS — E785 Hyperlipidemia, unspecified: Secondary | ICD-10-CM

## 2017-03-16 LAB — POCT URINALYSIS DIP (MANUAL ENTRY)
BILIRUBIN UA: NEGATIVE mg/dL
Bilirubin, UA: NEGATIVE
Blood, UA: NEGATIVE
Glucose, UA: NEGATIVE mg/dL
LEUKOCYTES UA: NEGATIVE
NITRITE UA: NEGATIVE
PH UA: 6 (ref 5.0–8.0)
PROTEIN UA: NEGATIVE mg/dL
Spec Grav, UA: 1.02 (ref 1.010–1.025)
Urobilinogen, UA: 1 E.U./dL

## 2017-03-16 LAB — POCT GLYCOSYLATED HEMOGLOBIN (HGB A1C): Hemoglobin A1C: 6.5

## 2017-03-16 MED ORDER — ALENDRONATE SODIUM 70 MG PO TABS
70.0000 mg | ORAL_TABLET | ORAL | 3 refills | Status: DC
Start: 2017-03-16 — End: 2018-04-13

## 2017-03-16 MED ORDER — ALBUTEROL SULFATE (2.5 MG/3ML) 0.083% IN NEBU: 2.5000 mg | INHALATION_SOLUTION | Freq: Once | RESPIRATORY_TRACT | Status: DC

## 2017-03-16 MED ORDER — ALENDRONATE SODIUM 70 MG PO TABS: 70.0000 mg | ORAL_TABLET | ORAL | 3 refills | Status: DC

## 2017-03-16 NOTE — Patient Instructions (Addendum)
Start a daily 678m chewable calcium supplement.  YOUR BLOOD SUGAR HAS GOTTEN SIGNIFICANTLY WORSE OVER THE PAST 3 MONTHS - YOUR AVERAGE BLOOD SUGAR IS NOW 140 OVER SEPT/OCT/NOV WHERE IT WAS 128 OVER MAY/JUNE/JULY.  DON'T LET IT INCREASE ANYMORE OR ELSE WE WILL HAVE TO RESTART METFORMIN AT YOUR NEXT VISIT IN 4 MONTHS.  IF you received an x-ray today, you will receive an [facility] from [facility]. Please contact [facility] at [phone] questions or concerns regarding your invoice.   IF you received labwork today, you will receive an invoice from [facility] Please contact [facility] at [phone] questions or concerns regarding your invoice.   Our billing staff will not be able to assist you with questions regarding bills from these companies.  You will be contacted with the lab results as soon as they are available. The fastest way to get your results is to activate your My Chart account. Instructions are located on the last page of this paperwork. If you have not heard from [facility] the results in 2 weeks, please contact this office.      Bone Health Bones protect organs, store calcium, and anchor muscles. Good health habits, such as eating nutritious foods and exercising regularly, are important for maintaining healthy bones. They can also help to prevent a condition that causes bones to lose density and become weak and brittle (osteoporosis). Why is bone mass important? Bone mass refers to the amount of bone tissue that you have. The higher your bone mass, the stronger your bones. An important step toward having healthy bones throughout life is to have strong and dense bones during childhood. A young adult who has a high bone mass is more likely to have a high bone mass later in life. Bone mass at its greatest it is called peak bone mass. A large decline in bone mass occurs in older adults. In women, it occurs about the time of menopause. During this time, it is  important to practice good health habits, because if more bone is lost than what is replaced, the bones will become less healthy and more likely to break (fracture). If you find that you have a low bone mass, you may be able to prevent osteoporosis or further bone loss by changing your diet and lifestyle. How can I find out if my bone mass is low? Bone mass can be measured with an X-ray test that is called a bone mineral density (BMD) test. This test is recommended for all women who are age 5353or older. It may also be recommended for men who are age 481or older, or for people who are more likely to develop osteoporosis due to:  Having bones that break easily.  Having a long-term disease that weakens bones, such as kidney disease or rheumatoid arthritis.  Having menopause earlier than normal.  Taking medicine that weakens bones, such as steroids, thyroid hormones, or hormone treatment for breast cancer or prostate cancer.  Smoking.  Drinking three or more alcoholic drinks each day.  What are the nutritional recommendations for healthy bones? To have healthy bones, you need to get enough of the right minerals and vitamins. Most nutrition experts recommend getting these nutrients from the foods that you eat. Nutritional recommendations vary from person to person. Ask your health care provider what is healthy for you. Here are some general guidelines. Calcium Recommendations Calcium is the most important (essential) mineral for bone health. Most people can get enough calcium from their diet, but  supplements may be recommended for people who are at risk for osteoporosis. Good sources of calcium include:  Dairy products, such as low-fat or nonfat milk, cheese, and yogurt.  Dark green leafy vegetables, such as bok choy and broccoli.  Calcium-fortified foods, such as orange juice, cereal, bread, soy beverages, and tofu products.  Nuts, such as almonds.  Follow these recommended amounts for daily  calcium intake:  Children, age 29?3: 700 mg.  Children, age 70?8: 1,000 mg.  Children, age 72?13: 1,300 mg.  Teens, age 293?18: 1,300 mg.  Adults, age 78?50: 1,000 mg.  Adults, age 70?70: ? Men: 1,000 mg. ? Women: 1,200 mg.  Adults, age 25 or older: 1,200 mg.  Pregnant and breastfeeding females: ? Teens: 1,300 mg. ? Adults: 1,000 mg.  Vitamin D Recommendations Vitamin D is the most essential vitamin for bone health. It helps the body to absorb calcium. Sunlight stimulates the skin to make vitamin D, so be sure to get enough sunlight. If you live in a cold climate or you do not get outside often, your health care provider may recommend that you take vitamin D supplements. Good sources of vitamin D in your diet include:  Egg yolks.  Saltwater fish.  Milk and cereal fortified with vitamin D.  Follow these recommended amounts for daily vitamin D intake:  Children and teens, age 79?18: 33 international units.  Adults, age 70 or younger: 400-800 international units.  Adults, age 33 or older: 800-1,000 international units.  Other Nutrients Other nutrients for bone health include:  Phosphorus. This mineral is found in meat, poultry, dairy foods, nuts, and legumes. The recommended daily intake for adult men and adult women is 700 mg.  Magnesium. This mineral is found in seeds, nuts, dark green vegetables, and legumes. The recommended daily intake for adult men is 400?420 mg. For adult women, it is 310?320 mg.  Vitamin K. This vitamin is found in green leafy vegetables. The recommended daily intake is 120 mg for adult men and 90 mg for adult women.  What type of physical activity is best for building and maintaining healthy bones? Weight-bearing and strength-building activities are important for building and maintaining peak bone mass. Weight-bearing activities cause muscles and bones to work against gravity. Strength-building activities increases muscle strength that supports  bones. Weight-bearing and muscle-building activities include:  Walking and hiking.  Jogging and running.  Dancing.  Gym exercises.  Lifting weights.  Tennis and racquetball.  Climbing stairs.  Aerobics.  Adults should get at least 30 minutes of moderate physical activity on most days. Children should get at least 60 minutes of moderate physical activity on most days. Ask your health care provide what type of exercise is best for you. Where can I find more information? For more information, check out the following websites:  [place]: YardHomes.se  [employer] of Health: http://www.niams.AnonymousEar.fr.asp  This information is not intended to replace advice given to you by your health care provider. Make sure you discuss any questions you have with your health care provider. Document Released: 07/12/2003 Document Revised: 11/09/2015 Document Reviewed: 04/26/2014 Elsevier Interactive Patient Education  2018 [place].  Calcium Intake Recommendations Calcium is a mineral that affects many functions in the body, including:  Blood clotting.  Blood vessel function.  Nerve impulse conduction.  Hormone secretion.  Muscle contraction.  Bone and teeth functions.  Most of your body's calcium supply is stored in your bones and teeth. When your calcium stores are low, you may be at  risk for low bone mass, bone loss, and bone fractures. Consuming enough calcium helps to grow healthy bones and teeth and to prevent breakdown over time. It is very important that you get enough calcium if you are:  A child undergoing rapid growth.  An adolescent girl.  A pre- or post-menopausal woman.  A woman whose menstrual cycle has stopped due to anorexia nervosa or regular intense exercise.  An individual with lactose intolerance or a milk allergy.  A vegetarian.  What is my plan? Try to  consume the recommended amount of calcium daily based on your age. Depending on your overall health, your health care provider may recommend increased calcium intake.General daily calcium intake recommendations by age are:  Birth to 6 months: 200 mg.  Infants 7 to 12 months: 260 mg.  Children 1 to 3 years: 700 mg.  Children 4 to 8 years: 1,000 mg.  Children 9 to 13 years: 1,300 mg.  Teens 14 to 18 years: 1,300 mg.  Adults 19 to 50 years: 1,000 mg.  Adult women 51 to 70 years: 1,200 mg.  Adult men 51 to 70 years: 1,000 mg.  Adults 71 years and older: 1,200 mg.  Pregnant and breastfeeding teens: 1,300 mg.  Pregnant and breastfeeding adults: 1,000 mg.  What do I need to know about calcium intake?  In order for the body to absorb calcium, it needs vitamin D. You can get vitamin D through: ? Direct exposure of the skin to sunlight. ? Foods, such as egg yolks, liver, saltwater fish, and fortified milk. ? Supplements.  Consuming too much calcium may cause: ? Constipation. ? Decreased absorption of iron and zinc. ? Kidney stones.  Calcium supplements may interact with certain medicines. Check with your health care provider before starting any calcium supplements.  Try to get most of your calcium from food. What foods can I eat? Grains  Fortified oatmeal. Fortified ready-to-eat cereals. Fortified frozen waffles. Vegetables Turnip greens. Broccoli. Fruits Fortified orange juice. Meats and Other Protein Sources Canned sardines with bones. Canned salmon with bones. Soy beans. Tofu. Baked beans. Almonds. [place] nuts. Sunflower seeds. Dairy Milk. Yogurt. Cheese. Cottage cheese. Beverages Fortified soy milk. Fortified rice milk. Sweets/Desserts Pudding. Ice Cream. Milkshakes. Blackstrap molasses. The items listed above may not be a complete list of recommended foods or beverages. Contact your dietitian for more options. What foods can affect my calcium intake? It may be  more difficult for your body to use calcium or calcium may leave your body more quickly if you consume large amounts of:  Sodium.  Protein.  Caffeine.  Alcohol.  This information is not intended to replace advice given to you by your health care provider. Make sure you discuss any questions you have with your health care provider. Document Released: 12/04/2003 Document Revised: 11/09/2015 Document Reviewed: 09/27/2013 Elsevier Interactive Patient Education  2018 [place] Maintenance for Postmenopausal Women Menopause is a normal process in which your reproductive ability comes to an end. This process happens gradually over a span of months to years, usually between the ages of 57 and 10. Menopause is complete when you have missed 12 consecutive menstrual periods. It is important to talk with your health care provider about some of the most common conditions that affect postmenopausal women, such as heart disease, cancer, and bone loss (osteoporosis). Adopting a healthy lifestyle and getting preventive care can help to promote your health and wellness. Those actions can also lower your chances of developing some of these  common conditions. What should I know about menopause? During menopause, you may experience a number of symptoms, such as:  Moderate-to-severe hot flashes.  Night sweats.  Decrease in sex drive.  Mood swings.  Headaches.  Tiredness.  Irritability.  Memory problems.  Insomnia.  Choosing to treat or not to treat menopausal changes is an individual decision that you make with your health care provider. What should I know about hormone replacement therapy and supplements? Hormone therapy products are effective for treating symptoms that are associated with menopause, such as hot flashes and night sweats. Hormone replacement carries certain risks, especially as you become older. If you are thinking about using estrogen or estrogen with progestin  treatments, discuss the benefits and risks with your health care provider. What should I know about heart disease and stroke? Heart disease, heart attack, and stroke become more likely as you age. This may be due, in part, to the hormonal changes that your body experiences during menopause. These can affect how your body processes dietary fats, triglycerides, and cholesterol. Heart attack and stroke are both medical emergencies. There are many things that you can do to help prevent heart disease and stroke:  Have your blood pressure checked at least every 1-2 years. High blood pressure causes heart disease and increases the risk of stroke.  If you are 33-87 years old, ask your health care provider if you should take aspirin to prevent a heart attack or a stroke.  Do not use any tobacco products, including cigarettes, chewing tobacco, or electronic cigarettes. If you need help quitting, ask your health care provider.  It is important to eat a healthy diet and maintain a healthy weight. ? Be sure to include plenty of vegetables, fruits, low-fat dairy products, and lean protein. ? Avoid eating foods that are high in solid fats, added sugars, or salt (sodium).  Get regular exercise. This is one of the most important things that you can do for your health. ? Try to exercise for at least 150 minutes each week. The type of exercise that you do should increase your heart rate and make you sweat. This is known as moderate-intensity exercise. ? Try to do strengthening exercises at least twice each week. Do these in addition to the moderate-intensity exercise.  Know your numbers.Ask your health care provider to check your cholesterol and your blood glucose. Continue to have your blood tested as directed by your health care provider.  What should I know about cancer screening? There are several types of cancer. Take the following steps to reduce your risk and to catch any cancer development as early as  possible. Breast Cancer  Practice breast self-awareness. ? This means understanding how your breasts normally appear and feel. ? It also means doing regular breast self-exams. Let your health care provider know about any changes, no matter how small.  If you are 53 or older, have a clinician do a breast exam (clinical breast exam or CBE) every year. Depending on your age, family history, and medical history, it may be recommended that you also have a yearly breast X-ray (mammogram).  If you have a family history of breast cancer, talk with your health care provider about genetic screening.  If you are at high risk for breast cancer, talk with your health care provider about having an MRI and a mammogram every year.  Breast cancer (BRCA) gene test is recommended for women who have family members with BRCA-related cancers. Results of the assessment will determine the  need for genetic counseling and BRCA1 and for BRCA2 testing. BRCA-related cancers include these types: ? Breast. This occurs in males or females. ? Ovarian. ? Tubal. This may also be called fallopian tube cancer. ? Cancer of the abdominal or pelvic lining (peritoneal cancer). ? Prostate. ? Pancreatic.  Cervical, Uterine, and Ovarian Cancer Your health care provider may recommend that you be screened regularly for cancer of the pelvic organs. These include your ovaries, uterus, and vagina. This screening involves a pelvic exam, which includes checking for microscopic changes to the surface of your cervix (Pap test).  For women ages 21-65, health care providers may recommend a pelvic exam and a Pap test every three years. For women ages 25-65, they may recommend the Pap test and pelvic exam, combined with testing for human papilloma virus (HPV), every five years. Some types of HPV increase your risk of cervical cancer. Testing for HPV may also be done on women of any age who have unclear Pap test results.  Other health care  providers may not recommend any screening for nonpregnant women who are considered low risk for pelvic cancer and have no symptoms. Ask your health care provider if a screening pelvic exam is right for you.  If you have had past treatment for cervical cancer or a condition that could lead to cancer, you need Pap tests and screening for cancer for at least 20 years after your treatment. If Pap tests have been discontinued for you, your risk factors (such as having a new sexual partner) need to be reassessed to determine if you should start having screenings again. Some women have medical problems that increase the chance of getting cervical cancer. In these cases, your health care provider may recommend that you have screening and Pap tests more often.  If you have a family history of uterine cancer or ovarian cancer, talk with your health care provider about genetic screening.  If you have vaginal bleeding after reaching menopause, tell your health care provider.  There are currently no reliable tests available to screen for ovarian cancer.  Lung Cancer Lung cancer screening is recommended for adults 65-3 years old who are at high risk for lung cancer because of a history of smoking. A yearly low-dose CT scan of the lungs is recommended if you:  Currently smoke.  Have a history of at least 30 pack-years of smoking and you currently smoke or have quit within the past 15 years. A pack-year is smoking an average of one pack of cigarettes per day for one year.  Yearly screening should:  Continue until it has been 15 years since you quit.  Stop if you develop a health problem that would prevent you from having lung cancer treatment.  Colorectal Cancer  This type of cancer can be detected and can often be prevented.  Routine colorectal cancer screening usually begins at age 15 and continues through age 25.  If you have risk factors for colon cancer, your health care provider may recommend  that you be screened at an earlier age.  If you have a family history of colorectal cancer, talk with your health care provider about genetic screening.  Your health care provider may also recommend using home test kits to check for hidden blood in your stool.  A small camera at the end of a tube can be used to examine your colon directly (sigmoidoscopy or colonoscopy). This is done to check for the earliest forms of colorectal cancer.  Direct examination of  the colon should be repeated every 5-10 years until age 25. However, if early forms of precancerous polyps or small growths are found or if you have a family history or genetic risk for colorectal cancer, you may need to be screened more often.  Skin Cancer  Check your skin from head to toe regularly.  Monitor any moles. Be sure to tell your health care provider: ? About any new moles or changes in moles, especially if there is a change in a mole's shape or color. ? If you have a mole that is larger than the size of a pencil eraser.  If any of your family members has a history of skin cancer, especially at a young age, talk with your health care provider about genetic screening.  Always use sunscreen. Apply sunscreen liberally and repeatedly throughout the day.  Whenever you are outside, protect yourself by wearing long sleeves, pants, a wide-brimmed hat, and sunglasses.  What should I know about osteoporosis? Osteoporosis is a condition in which bone destruction happens more quickly than new bone creation. After menopause, you may be at an increased risk for osteoporosis. To help prevent osteoporosis or the bone fractures that can happen because of osteoporosis, the following is recommended:  If you are 7-32 years old, get at least 1,000 mg of calcium and at least 600 mg of vitamin D per day.  If you are older than age 28 but younger than age 14, get at least 1,200 mg of calcium and at least 600 mg of vitamin D per day.  If you  are older than age 65, get at least 1,200 mg of calcium and at least 800 mg of vitamin D per day.  Smoking and excessive alcohol intake increase the risk of osteoporosis. Eat foods that are rich in calcium and vitamin D, and do weight-bearing exercises several times each week as directed by your health care provider. What should I know about how menopause affects my mental health? Depression may occur at any age, but it is more common as you become older. Common symptoms of depression include:  Low or sad mood.  Changes in sleep patterns.  Changes in appetite or eating patterns.  Feeling an overall lack of motivation or enjoyment of activities that you previously enjoyed.  Frequent crying spells.  Talk with your health care provider if you think that you are experiencing depression. What should I know about immunizations? It is important that you get and maintain your immunizations. These include:  Tetanus, diphtheria, and pertussis (Tdap) booster vaccine.  Influenza every year before the flu season begins.  Pneumonia vaccine.  Shingles vaccine.  Your health care provider may also recommend other immunizations. This information is not intended to replace advice given to you by your health care provider. Make sure you discuss any questions you have with your health care provider. Document Released: 06/13/2005 Document Revised: 11/09/2015 Document Reviewed: 01/23/2015 Elsevier Interactive Patient Education  2018 [employer].

## 2017-03-17 LAB — CBC WITH DIFFERENTIAL/PLATELET
BASOS: 0 %
Basophils Absolute: 0 10*3/uL (ref 0.0–0.2)
EOS (ABSOLUTE): 0.2 10*3/uL (ref 0.0–0.4)
EOS: 2 %
HEMATOCRIT: 39.9 % (ref 34.0–46.6)
Hemoglobin: 13.2 g/dL (ref 11.1–15.9)
IMMATURE GRANS (ABS): 0 10*3/uL (ref 0.0–0.1)
Immature Granulocytes: 0 %
LYMPHS: 24 %
Lymphocytes Absolute: 1.9 10*3/uL (ref 0.7–3.1)
MCH: 31.8 pg (ref 26.6–33.0)
MCHC: 33.1 g/dL (ref 31.5–35.7)
MCV: 96 fL (ref 79–97)
MONOCYTES: 9 %
Monocytes Absolute: 0.7 10*3/uL (ref 0.1–0.9)
NEUTROS ABS: 5 10*3/uL (ref 1.4–7.0)
Neutrophils: 65 %
PLATELETS: 314 10*3/uL (ref 150–379)
RBC: 4.15 x10E6/uL (ref 3.77–5.28)
RDW: 14.1 % (ref 12.3–15.4)
WBC: 7.8 10*3/uL (ref 3.4–10.8)

## 2017-03-17 LAB — LIPID PANEL
CHOLESTEROL TOTAL: 148 mg/dL (ref 100–199)
Chol/HDL Ratio: 2.8 ratio (ref 0.0–4.4)
HDL: 52 mg/dL (ref >39)
LDL Calculated: 73 mg/dL (ref 0–99)
TRIGLYCERIDES: 113 mg/dL (ref 0–149)
VLDL Cholesterol Cal: 23 mg/dL (ref 5–40)

## 2017-03-17 LAB — COMPREHENSIVE METABOLIC PANEL
ALBUMIN: 4.3 g/dL (ref 3.5–4.8)
ALK PHOS: 80 IU/L (ref 39–117)
ALT: 13 IU/L (ref 0–32)
AST: 16 IU/L (ref 0–40)
Albumin/Globulin Ratio: 1.9 (ref 1.2–2.2)
BUN / CREAT RATIO: 21 (ref 12–28)
BUN: 12 mg/dL (ref 8–27)
Bilirubin Total: 0.5 mg/dL (ref 0.0–1.2)
CALCIUM: 9.3 mg/dL (ref 8.7–10.3)
CO2: 23 mmol/L (ref 20–29)
CREATININE: 0.56 mg/dL — AB (ref 0.57–1.00)
Chloride: 106 mmol/L (ref 96–106)
GFR calc Af Amer: 108 mL/min/{1.73_m2} (ref >59)
GFR, EST NON AFRICAN AMERICAN: 93 mL/min/{1.73_m2} (ref >59)
GLOBULIN, TOTAL: 2.3 g/dL (ref 1.5–4.5)
GLUCOSE: 101 mg/dL — AB (ref 65–99)
Potassium: 4.3 mmol/L (ref 3.5–5.2)
SODIUM: 144 mmol/L (ref 134–144)
Total Protein: 6.6 g/dL (ref 6.0–8.5)

## 2017-03-17 LAB — TSH: TSH: 0.261 u[IU]/mL — AB (ref 0.450–4.500)

## 2017-03-20 ENCOUNTER — Encounter: Payer: Self-pay | Admitting: Radiology

## 2017-04-06 DIAGNOSIS — Z1211 Encounter for screening for malignant neoplasm of colon: Secondary | ICD-10-CM | POA: Diagnosis not present

## 2017-04-06 DIAGNOSIS — Z1212 Encounter for screening for malignant neoplasm of rectum: Secondary | ICD-10-CM | POA: Diagnosis not present

## 2017-04-07 ENCOUNTER — Other Ambulatory Visit: Payer: Self-pay | Admitting: Family Medicine

## 2017-04-10 ENCOUNTER — Other Ambulatory Visit: Payer: Self-pay | Admitting: Family Medicine

## 2017-04-15 ENCOUNTER — Telehealth: Payer: Self-pay

## 2017-04-15 DIAGNOSIS — R195 Other fecal abnormalities: Secondary | ICD-10-CM

## 2017-04-15 NOTE — Telephone Encounter (Signed)
Do you know if we got these to Dr. Brigitte Pulse?  Copied from [place] [phone]. Topic: Quick Communication - See Telephone Encounter >> Apr 13, 2017  2:22 PM [place], Laban Emperor wrote: CRM for notification. See Telephone encounter for: Jinny Blossom with [employer]ations wanted call and let Dr. Brigitte Pulse know they faxed over an abnormal colon guard result.  [phone] opt 1  04/13/17.

## 2017-04-16 ENCOUNTER — Encounter: Payer: Self-pay | Admitting: Internal Medicine

## 2017-04-16 MED ORDER — MOMETASONE FUROATE 220 MCG/INH IN AEPB: 1.0000 | INHALATION_SPRAY | Freq: Two times a day (BID) | RESPIRATORY_TRACT | 2 refills | Status: DC

## 2017-04-16 NOTE — Addendum Note (Signed)
Addended by: Shawnee Knapp on: 04/16/2017 08:09 AM   Modules accepted: Orders

## 2017-04-16 NOTE — Telephone Encounter (Signed)
Received fax Wed 12/12.  Called pt Thurs 12/13. She agrees to go to colonoscopy Referred back to [facility] - saw Dr. Carlean Purl prior - for colonoscopy.

## 2017-05-28 ENCOUNTER — Other Ambulatory Visit: Payer: Self-pay

## 2017-05-28 ENCOUNTER — Ambulatory Visit (AMBULATORY_SURGERY_CENTER): Payer: Self-pay | Admitting: *Deleted

## 2017-05-28 ENCOUNTER — Encounter: Payer: Self-pay | Admitting: Internal Medicine

## 2017-05-28 VITALS — Ht 65.0 in | Wt 129.0 lb

## 2017-05-28 DIAGNOSIS — Z86010 Personal history of colonic polyps: Secondary | ICD-10-CM

## 2017-05-28 NOTE — Progress Notes (Signed)
No egg or soy allergy known to patient  No issues with past sedation with any surgeries  or procedures, no intubation problems  No diet pills per patient No home 02 use per patient  No blood thinners per patient  Pt states  issues with constipation  No A fib or A flutter  EMMI video sent to pt's e mail

## 2017-06-11 ENCOUNTER — Encounter: Payer: Self-pay | Admitting: Internal Medicine

## 2017-06-11 ENCOUNTER — Other Ambulatory Visit: Payer: Self-pay

## 2017-06-11 ENCOUNTER — Ambulatory Visit (AMBULATORY_SURGERY_CENTER): Payer: PPO | Admitting: Internal Medicine

## 2017-06-11 VITALS — BP 156/76 | HR 71 | Temp 98.0°F | Resp 14 | Ht 65.0 in | Wt 129.0 lb

## 2017-06-11 DIAGNOSIS — Z1211 Encounter for screening for malignant neoplasm of colon: Secondary | ICD-10-CM | POA: Diagnosis not present

## 2017-06-11 DIAGNOSIS — D124 Benign neoplasm of descending colon: Secondary | ICD-10-CM | POA: Diagnosis not present

## 2017-06-11 DIAGNOSIS — Z86010 Personal history of colonic polyps: Secondary | ICD-10-CM

## 2017-06-11 DIAGNOSIS — D122 Benign neoplasm of ascending colon: Secondary | ICD-10-CM | POA: Diagnosis not present

## 2017-06-11 DIAGNOSIS — D123 Benign neoplasm of transverse colon: Secondary | ICD-10-CM | POA: Diagnosis not present

## 2017-06-11 DIAGNOSIS — D125 Benign neoplasm of sigmoid colon: Secondary | ICD-10-CM

## 2017-06-11 MED ORDER — SODIUM CHLORIDE 0.9 % IV SOLN: 500.0000 mL | Freq: Once | INTRAVENOUS | Status: DC

## 2017-06-11 NOTE — Patient Instructions (Addendum)
I found and removed 5 polyps. I will let you know pathology results and when to have another routine colonoscopy by mail and/or My Chart.  You also have a condition called diverticulosis - common and not usually a problem. Please read the handout provided.  I appreciate the opportunity to care for you. Gatha Mayer, MD, [facility]   Discharge instructions given. Handouts on polyps and diverticulosis. No aspirin,ibuprofen,naproxen,or other non-steroidal anti-inflammatory drugs for 2 weeks. Resume previous medications. YOU HAD AN ENDOSCOPIC PROCEDURE TODAY AT [facility]:   Refer to the procedure report that was given to you for any specific questions about what was found during the examination.  If the procedure report does not answer your questions, please call your gastroenterologist to clarify.  If you requested that your care partner not be given the details of your procedure findings, then the procedure report has been included in a sealed envelope for you to review at your convenience later.  YOU SHOULD EXPECT: Some feelings of bloating in the abdomen. Passage of more gas than usual.  Walking can help get rid of the air that was put into your GI tract during the procedure and reduce the bloating. If you had a lower endoscopy (such as a colonoscopy or flexible sigmoidoscopy) you may notice spotting of blood in your stool or on the toilet paper. If you underwent a bowel prep for your procedure, you may not have a normal bowel movement for a few days.  Please Note:  You might notice some irritation and congestion in your nose or some drainage.  This is from the oxygen used during your procedure.  There is no need for concern and it should clear up in a day or so.  SYMPTOMS TO REPORT IMMEDIATELY:   Following lower endoscopy (colonoscopy or flexible sigmoidoscopy):  Excessive amounts of blood in the stool  Significant tenderness or worsening of abdominal  pains  Swelling of the abdomen that is new, acute  Fever of 100F or higher   For urgent or emergent issues, a gastroenterologist can be reached at any hour by calling [phone].   DIET:  We do recommend a small meal at first, but then you may proceed to your regular diet.  Drink plenty of fluids but you should avoid alcoholic beverages for 24 hours.  ACTIVITY:  You should plan to take it easy for the rest of today and you should NOT DRIVE or use heavy machinery until tomorrow (because of the sedation medicines used during the test).    FOLLOW UP: Our staff will call the number listed on your records the next business day following your procedure to check on you and address any questions or concerns that you may have regarding the information given to you following your procedure. If we do not reach you, we will leave a message.  However, if you are feeling well and you are not experiencing any problems, there is no need to return our call.  We will assume that you have returned to your regular daily activities without incident.  If any biopsies were taken you will be contacted by phone or by letter within the next 1-3 weeks.  Please call us at [phone] if you have not heard about the biopsies in 3 weeks.    SIGNATURES/CONFIDENTIALITY: You and/or your care partner have signed paperwork which will be entered into your electronic medical record.  These signatures attest to the fact that that the information above on  your After Visit Summary has been reviewed and is understood.  Full responsibility of the confidentiality of this discharge information lies with you and/or your care-partner. 

## 2017-06-11 NOTE — Op Note (Signed)
[place] Patient Name: Terry Armstrong Procedure Date: 06/11/2017 10:34 AM MRN: [id number] Endoscopist: Gatha Mayer , MD Age: 72 Referring MD:  Date of Birth: 05-19-1945 Gender: Female Account #: [id number] Procedure:                Colonoscopy Indications:              Surveillance: Personal history of adenomatous                            polyps on last colonoscopy > 5 years ago Medicines:                Propofol per Anesthesia, Monitored Anesthesia Care Procedure:                Pre-Anesthesia Assessment:                           - Prior to the procedure, a History and Physical                            was performed, and patient medications and                            allergies were reviewed. The patient's tolerance of                            previous anesthesia was also reviewed. The risks                            and benefits of the procedure and the sedation                            options and risks were discussed with the patient.                            All questions were answered, and informed consent                            was obtained. Prior Anticoagulants: The patient has                            taken no previous anticoagulant or antiplatelet                            agents. ASA Grade Assessment: II - A patient with                            mild systemic disease. After reviewing the risks                            and benefits, the patient was deemed in                            satisfactory condition to undergo the procedure.  After obtaining informed consent, the colonoscope                            was passed under direct vision. Throughout the                            procedure, the patient's blood pressure, pulse, and                            oxygen saturations were monitored continuously. The                            Model CF-HQ190L [id number]) scope was introduced     through the anus and advanced to the the cecum,                            identified by appendiceal orifice and ileocecal                            valve. The colonoscopy was technically difficult                            and complex due to restricted mobility of the                            colon. Successful completion of the procedure was                            aided by withdrawing and reinserting the scope. The                            patient tolerated the procedure well. The quality                            of the bowel preparation was excellent. The bowel                            preparation used was Miralax. The ileocecal valve,                            appendiceal orifice, and rectum were photographed. Scope In: 10:47:00 AM Scope Out: 11:09:49 AM Scope Withdrawal Time: 0 hours 18 minutes 6 seconds  Total Procedure Duration: 0 hours 22 minutes 49 seconds  Findings:                 The perianal and digital rectal examinations were                            normal.                           A 10 mm polyp was found in the sigmoid colon. The  polyp was pedunculated. The polyp was removed with                            a hot snare. Resection and retrieval were complete.                            Verification of patient identification for the                            specimen was done. Estimated blood loss: none.                           Three sessile polyps were found in the descending                            colon, transverse colon and ascending colon. The                            polyps were diminutive in size. These polyps were                            removed with a cold snare. Resection and retrieval                            were complete. Verification of patient                            identification for the specimen was done. Estimated                            blood loss was minimal.                           A 1  mm polyp was found in the descending colon. The                            polyp was sessile. The polyp was removed with a                            cold biopsy forceps. Resection and retrieval were                            complete. Verification of patient identification                            for the specimen was done. Estimated blood loss was                            minimal.                           Many diverticula were found in the sigmoid colon.  Scattered diverticula were found in the transverse                            colon and ascending colon.                           The exam was otherwise without abnormality on                            direct and retroflexion views. Complications:            No immediate complications. Estimated Blood Loss:     Estimated blood loss was minimal. Impression:               - One 10 mm polyp in the sigmoid colon, removed                            with a hot snare. Resected and retrieved.                           - Three diminutive polyps in the descending colon,                            in the transverse colon and in the ascending colon,                            removed with a cold snare. Resected and retrieved.                           - One 1 mm polyp in the descending colon, removed                            with a cold biopsy forceps. Resected and retrieved.                           - Diverticulosis in the sigmoid colon.                           - Diverticulosis in the transverse colon and in the                            ascending colon.                           - The examination was otherwise normal on direct                            and retroflexion views.                           - Personal history of colonic polyp diminutive                            adenoma 2010. Recommendation:           - Patient has  a contact number available for                            emergencies. The signs  and symptoms of potential                            delayed complications were discussed with the                            patient. Return to normal activities tomorrow.                            Written discharge instructions were provided to the                            patient.                           - Resume previous diet.                           - Continue present medications.                           - No aspirin, ibuprofen, naproxen, or other                            non-steroidal anti-inflammatory drugs for 2 weeks                            after polyp removal.                           - Repeat colonoscopy is recommended for                            surveillance. The colonoscopy date will be                            determined after pathology results from today's                            exam become available for review.                           - Use pediatric colonoscopy next time - had to use                            water lavage slide by in sigmoid today Gatha Mayer, MD 06/11/2017 11:17:06 AM This report has been signed electronically.

## 2017-06-11 NOTE — Progress Notes (Signed)
Called to room to assist during endoscopic procedure.  Patient ID and intended procedure confirmed with present staff. Received instructions for my participation in the procedure from the performing physician.  

## 2017-06-11 NOTE — Progress Notes (Signed)
To PACU, VSS. Report to [email or web address] 

## 2017-06-12 ENCOUNTER — Telehealth: Payer: Self-pay

## 2017-06-12 NOTE — Telephone Encounter (Signed)
  Follow up Call-  Call back number 06/11/2017  Post procedure Call Back phone  # [phone]  Permission to leave phone message Yes  Some recent data might be hidden     Patient questions:  Do you have a fever, pain , or abdominal swelling? No. Pain Score  0 *  Have you tolerated food without any problems? Yes.    Have you been able to return to your normal activities? Yes.    Do you have any questions about your discharge instructions: Diet   No. Medications  No. Follow up visit  No.  Do you have questions or concerns about your Care? No.  Actions: * If pain score is 4 or above: No action needed, pain <4.

## 2017-06-19 ENCOUNTER — Encounter: Payer: Self-pay | Admitting: Internal Medicine

## 2017-06-19 DIAGNOSIS — Z860101 Personal history of adenomatous and serrated colon polyps: Secondary | ICD-10-CM

## 2017-06-19 DIAGNOSIS — Z86010 Personal history of colonic polyps: Secondary | ICD-10-CM

## 2017-06-19 HISTORY — DX: Personal history of colonic polyps: Z86.010

## 2017-06-19 HISTORY — DX: Personal history of adenomatous and serrated colon polyps: Z86.0101

## 2017-06-19 NOTE — Progress Notes (Signed)
6 adenomas Recall 2022

## 2017-07-14 DIAGNOSIS — H2513 Age-related nuclear cataract, bilateral: Secondary | ICD-10-CM | POA: Diagnosis not present

## 2017-07-14 DIAGNOSIS — H31003 Unspecified chorioretinal scars, bilateral: Secondary | ICD-10-CM | POA: Diagnosis not present

## 2017-07-14 DIAGNOSIS — E119 Type 2 diabetes mellitus without complications: Secondary | ICD-10-CM | POA: Diagnosis not present

## 2017-07-14 DIAGNOSIS — H3589 Other specified retinal disorders: Secondary | ICD-10-CM | POA: Diagnosis not present

## 2017-07-14 LAB — HM DIABETES EYE EXAM

## 2017-07-30 ENCOUNTER — Encounter: Payer: Self-pay | Admitting: Family Medicine

## 2017-07-30 ENCOUNTER — Ambulatory Visit (INDEPENDENT_AMBULATORY_CARE_PROVIDER_SITE_OTHER): Payer: PPO | Admitting: Family Medicine

## 2017-07-30 ENCOUNTER — Other Ambulatory Visit: Payer: Self-pay

## 2017-07-30 VITALS — BP 110/72 | HR 103 | Temp 98.4°F | Resp 16 | Ht 65.0 in | Wt 129.4 lb

## 2017-07-30 DIAGNOSIS — R0981 Nasal congestion: Secondary | ICD-10-CM | POA: Diagnosis not present

## 2017-07-30 DIAGNOSIS — H6121 Impacted cerumen, right ear: Secondary | ICD-10-CM | POA: Diagnosis not present

## 2017-07-30 MED ORDER — METHYLPREDNISOLONE ACETATE 80 MG/ML IJ SUSP
80.0000 mg | Freq: Once | INTRAMUSCULAR | Status: AC
  Administered 2017-07-30: 80 mg via INTRAMUSCULAR

## 2017-07-30 MED ORDER — FLUTICASONE PROPIONATE 50 MCG/ACT NA SUSP: 2.0000 | Freq: Every day | NASAL | 2 refills | Status: DC

## 2017-07-30 MED ORDER — CETIRIZINE-PSEUDOEPHEDRINE ER 5-120 MG PO TB12: 1.0000 | ORAL_TABLET | Freq: Two times a day (BID) | ORAL | 0 refills | Status: DC

## 2017-07-30 NOTE — Patient Instructions (Addendum)
Hot showers or breathing in steam may help loosen the congestion.  Using a netti pot or sinus rinse is also likely to help you feel better and keep this from progressing.  Use nasal saline spray as needed throughout the day and use the fluticasone nasal spray every night before bed for at least 2 weeks.  I recommend augmenting with 12 hr cetirizine-pseudoephedrine which is available OTC but I sent a rx to your pharmacy so that your pharmacist can help you find it if needed.  Stop what you are during taking at home.  If no improvement or you are getting worse, please call so we can call you in a course of antibiotics like Augmentin.   IF you received an x-ray today, you will receive an invoice from [facility]. Please contact [facility] at [phone] with questions or concerns regarding your invoice.   IF you received labwork today, you will receive an invoice from [facility]. Please contact [facility] at [phone] with questions or concerns regarding your invoice.   Our billing staff will not be able to assist you with questions regarding bills from these companies.  You will be contacted with the lab results as soon as they are available. The fastest way to get your results is to activate your My Chart account. Instructions are located on the last page of this paperwork. If you have not heard from [place] regarding the results in 2 weeks, please contact this office.     Eustachian Tube Dysfunction The eustachian tube connects the middle ear to the back of the nose. It regulates air pressure in the middle ear by allowing air to move between the ear and nose. It also helps to drain fluid from the middle ear space. When the eustachian tube does not function properly, air pressure, fluid, or both can build up in the middle ear. Eustachian tube dysfunction can affect one or both ears. What are the causes? This condition happens when the eustachian tube becomes blocked or cannot open  normally. This may result from:  Ear infections.  Colds and other upper respiratory infections.  Allergies.  Irritation, such as from cigarette smoke or acid from the stomach coming up into the esophagus (gastroesophageal reflux).  Sudden changes in air pressure, such as from descending in an airplane.  Abnormal growths in the nose or throat, such as nasal polyps, tumors, or enlarged tissue at the back of the throat (adenoids).  What increases the risk? This condition may be more likely to develop in people who smoke and people who are overweight. Eustachian tube dysfunction may also be more likely to develop in children, especially children who have:  Certain birth defects of the mouth, such as cleft palate.  Large tonsils and adenoids.  What are the signs or symptoms? Symptoms of this condition may include:  A feeling of fullness in the ear.  Ear pain.  Clicking or popping noises in the ear.  Ringing in the ear.  Hearing loss.  Loss of balance.  Symptoms may get worse when the air pressure around you changes, such as when you travel to an area of high elevation or fly on an airplane. How is this diagnosed? This condition may be diagnosed based on:  Your symptoms.  A physical exam of your ear, nose, and throat.  Tests, such as those that measure: ? The movement of your eardrum (tympanogram). ? Your hearing (audiometry).  How is this treated? Treatment depends on the cause and severity of your condition.  If your symptoms are mild, you may be able to relieve your symptoms by moving air into ("popping") your ears. If you have symptoms of fluid in your ears, treatment may include:  Decongestants.  Antihistamines.  Nasal sprays or ear drops that contain medicines that reduce swelling (steroids).  In some cases, you may need to have a procedure to drain the fluid in your eardrum (myringotomy). In this procedure, a small tube is placed in the eardrum to:  Drain  the fluid.  Restore the air in the middle ear space.  Follow these instructions at home:  Take over-the-counter and prescription medicines only as told by your health care provider.  Use techniques to help pop your ears as recommended by your health care provider. These may include: ? Chewing gum. ? Yawning. ? Frequent, forceful swallowing. ? Closing your mouth, holding your nose closed, and gently blowing as if you are trying to blow air out of your nose.  Do not do any of the following until your health care provider approves: ? Travel to high altitudes. ? Fly in airplanes. ? Work in a P[employer] scheme manager or room. ? Scuba dive.  Keep your ears dry. Dry your ears completely after showering or bathing.  Do not smoke.  Keep all follow-up visits as told by your health care provider. This is important. Contact a health care provider if:  Your symptoms do not go away after treatment.  Your symptoms come back after treatment.  You are unable to pop your ears.  You have: ? A fever. ? Pain in your ear. ? Pain in your head or neck. ? Fluid draining from your ear.  Your hearing suddenly changes.  You become very dizzy.  You lose your balance. This information is not intended to replace advice given to you by your health care provider. Make sure you discuss any questions you have with your health care provider. Document Released: 05/18/2015 Document Revised: 09/27/2015 Document Reviewed: 05/10/2014 Elsevier Interactive Patient Education  [employer].

## 2017-07-30 NOTE — Progress Notes (Addendum)
Subjective:  By signing my name below, I, Terry Armstrong, attest that this documentation has been prepared under the direction and in the presence of Terry Cheadle, MD Electronically Signed: Ladene Artist, ED Scribe 07/30/2017 at 1:48 PM.   Patient ID: Terry Armstrong, female    DOB: Nov 18, 1945, 72 y.o.   MRN: [id number]  Chief Complaint  Patient presents with  . Sinusitis    both ears clogged, sinus pressure, x 5 days    HPI Terry Armstrong is a 71 y.o. female who presents to Primary Care at [facility] complaining of sinus pressure x 5 days. Pt also reports fatigue, nasal congestion, minimal clear rhinorrhea, a clogged sensation in both ears, R worse than L. She has tried [facility] and [employer] brand Claritin D qd x 3 days with the last dose being last night. Denies fever, chills, postnasal drip, sinus pain, wheezing, cough, sob.  Past Medical History:  Diagnosis Date  . Allergy   . Breast abscess   . Constipation    if drinks alot of water no issues - uses womens stool softener PRN only   . Diabetes mellitus without complication ([place])    pt has no meds- diet controlled only   . Hx of adenomatous colonic polyps 06/19/2017  . Hyperlipidemia   . Primary snoring    Current Outpatient Medications on File Prior to Visit  Medication Sig Dispense Refill  . Albuterol Sulfate (PROAIR RESPICLICK) 536 (90 Base) MCG/ACT AEPB Inhale 2 puffs into the lungs every 4 (four) hours as needed. 1 each 1  . alendronate (FOSAMAX) 70 MG tablet Take 1 tablet (70 mg total) every 7 (seven) days by mouth. Take with a full glass of water on an empty stomach. 12 tablet 3  . atorvastatin (LIPITOR) 40 MG tablet Take 1 tablet (40 mg total) by mouth daily. 90 tablet 3  . losartan (COZAAR) 25 MG tablet TAKE 1 TABLET BY MOUTH ONCE DAILY 90 tablet 1  . multivitamin-iron-minerals-folic acid (CENTRUM) chewable tablet Chew 1 tablet by mouth daily.    . Omega-3 Fatty Acids (FISH OIL) 1200 MG CAPS Take 1 capsule (1,200 mg total) by  mouth 2 (two) times daily. 60 capsule 3  . POTASSIUM CITRATE PO Take by mouth daily.    . mometasone (ASMANEX 60 METERED DOSES) 220 MCG/INH inhaler Inhale 1 puff into the lungs 2 (two) times daily. (Patient not taking: Reported on 07/30/2017) 1 Inhaler 2   Current Facility-Administered Medications on File Prior to Visit  Medication Dose Route Frequency Provider Last Rate Last Dose  . 0.9 %  sodium chloride infusion  500 mL Intravenous Once Gatha Mayer, MD      . albuterol (PROVENTIL) (2.5 MG/3ML) 0.083% nebulizer solution 2.5 mg  2.5 mg Nebulization Once Shawnee Knapp, MD       Past Surgical History:  Procedure Laterality Date  . ABDOMINAL HYSTERECTOMY  1993  . COLONOSCOPY    . POLYPECTOMY     Allergies  Allergen Reactions  . Aspirin     Swelling, scratching in throat   Family History  Problem Relation Age of Onset  . Diabetes Mother   . Diabetes Father   . Diabetes Unknown   . Hypertension Sister   . Hyperlipidemia Unknown   . Heart disease Sister        defibrillator  . Kidney failure Maternal Aunt        dialysis  . Diabetes Maternal Aunt   . Hypertension Maternal Aunt   . Diabetes Maternal  Aunt   . Heart Problems Maternal Grandmother   . Cancer Maternal Grandmother   . Heart disease Maternal Grandmother   . Hypertension Maternal Grandmother   . Diabetes Brother   . Colon cancer Neg Hx   . Colon polyps Neg Hx   . Rectal cancer Neg Hx   . Stomach cancer Neg Hx    Social History   Socioeconomic History  . Marital status: Married    Spouse name: Not on file  . Number of children: 0  . Years of education: 76  . Highest education level: Not on file  Occupational History    Employer: RETIRED  Social Needs  . Financial resource strain: Not on file  . Food insecurity:    Worry: Not on file    Inability: Not on file  . Transportation needs:    Medical: Not on file    Non-medical: Not on file  Tobacco Use  . Smoking status: Current Every Day Smoker     Packs/day: 0.30    Years: 34.00    Pack years: 10.20    Types: Cigarettes  . Smokeless tobacco: Never Used  . Tobacco comment: Patient states that she would love to quit smoking- trying to quit- has decreased to less than a pack a day   Substance and Sexual Activity  . Alcohol use: Yes    Comment: social  . Drug use: No  . Sexual activity: Not on file  Lifestyle  . Physical activity:    Days per week: Not on file    Minutes per session: Not on file  . Stress: Not on file  Relationships  . Social connections:    Talks on phone: Not on file    Gets together: Not on file    Attends religious service: Not on file    Active member of club or organization: Not on file    Attends meetings of clubs or organizations: Not on file    Relationship status: Not on file  Other Topics Concern  . Not on file  Social History Narrative   Patient lives with her significant other.   Patient is retired.   Patient drinks 2-3 cups of caffeine daily in the winter.   Patient has a college education.   Patient is right-handed.         Depression screen [facility] 2/9 07/30/2017 03/16/2017 03/03/2017 01/23/2017 01/13/2017  Decreased Interest 0 0 0 0 0  Down, Depressed, Hopeless 0 0 0 0 0  PHQ - 2 Score 0 0 0 0 0  Altered sleeping - - - - -  Tired, decreased energy - - - - -  Change in appetite - - - - -  Feeling bad or failure about yourself  - - - - -  Trouble concentrating - - - - -  Moving slowly or fidgety/restless - - - - -  Suicidal thoughts - - - - -  PHQ-9 Score - - - - -      Review of Systems  Constitutional: Positive for fatigue. Negative for chills and fever.  HENT: Positive for congestion, rhinorrhea and sinus pressure. Negative for sinus pain.   Respiratory: Negative for cough, shortness of breath and wheezing.       Objective:   Physical Exam  Constitutional: She is oriented to person, place, and time. She appears well-developed and well-nourished. No distress.  HENT:  Head:  Normocephalic and atraumatic.  Left Ear: Tympanic membrane normal.  Nose: Rhinorrhea (bilaterally) present.  Mouth/Throat: Oropharynx is clear and moist and mucous membranes are normal.  R canal: occluded with cerumen  Eyes: Conjunctivae and EOM are normal.  Neck: Neck supple. No tracheal deviation present. No thyromegaly present.  Cardiovascular: Normal rate, regular rhythm and normal heart sounds.  Pulmonary/Chest: Effort normal and breath sounds normal. No respiratory distress.  Musculoskeletal: Normal range of motion.  Lymphadenopathy:    She has cervical adenopathy (anterior; bilaterally).  Neurological: She is alert and oriented to person, place, and time.  Skin: Skin is warm and dry.  Psychiatric: She has a normal mood and affect. Her behavior is normal.  Nursing note and vitals reviewed.  BP 110/72   Pulse (!) 103   Temp 98.4 F (36.9 C)   Resp 16   Ht 5\' 5"  (1.651 m)   Wt 129 lb 6.4 oz (58.7 kg)   SpO2 100%   BMI 21.53 kg/m     Assessment & Plan:   1. Sinus congestion   2. Hearing loss of right ear due to cerumen impaction     Orders Placed This Encounter  Procedures  . Ear wax removal    Meds ordered this encounter  Medications  . methylPREDNISolone acetate (DEPO-MEDROL) injection 80 mg  . fluticasone (FLONASE) 50 MCG/ACT nasal spray    Sig: Place 2 sprays into both nostrils at bedtime.    Dispense:  16 g    Refill:  2  . cetirizine-pseudoephedrine (ZYRTEC-D) 5-120 MG tablet    Sig: Take 1 tablet by mouth 2 (two) times daily.    Dispense:  20 tablet    Refill:  0    I personally performed the services described in this documentation, which was scribed in my presence. The recorded information has been reviewed and considered, and addended by me as needed.   Terry Armstrong, M.D.  Primary Care at [facility] [address] [place], [place] [address] [phone] phone [phone] fax  07/30/17 2:46 PM

## 2017-09-03 ENCOUNTER — Encounter: Payer: Self-pay | Admitting: *Deleted

## 2017-11-03 ENCOUNTER — Other Ambulatory Visit: Payer: Self-pay | Admitting: Family Medicine

## 2017-11-18 ENCOUNTER — Other Ambulatory Visit: Payer: Self-pay | Admitting: Family Medicine

## 2018-01-08 ENCOUNTER — Other Ambulatory Visit: Payer: Self-pay | Admitting: Family Medicine

## 2018-01-08 NOTE — Telephone Encounter (Signed)
Cozar      25 mg  refill Last Refill:04/07/17 # 90 Last OV: 03/16/17 PCP: Delman Cheadle Pharmacy:[facility] [phone]

## 2018-01-10 IMAGING — US US SOFT TISSUE HEAD/NECK
1 series · 13 of 25 positions shown · non-contrast
Comparison: 08/09/2015
COMPARISON: None.

CLINICAL DATA: Prior ultrasound follow-up.  Follow-up nodules.

EXAM:
THYROID ULTRASOUND
TECHNIQUE: Ultrasound examination of the thyroid gland and adjacent soft
tissues was performed.
CLINICAL DATA: Goiter.

[Series 1: us soft tissue head/neck · 0.05mm/px · 13 of 47 slices shown]
[im 1/47]
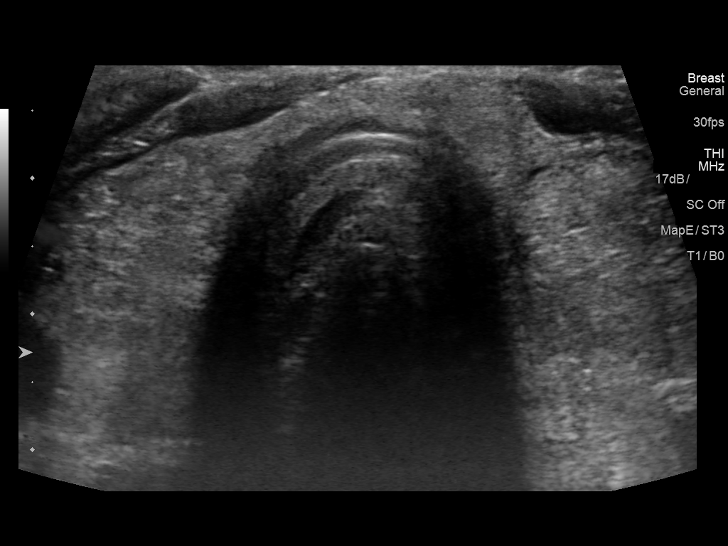
[im 4/47]
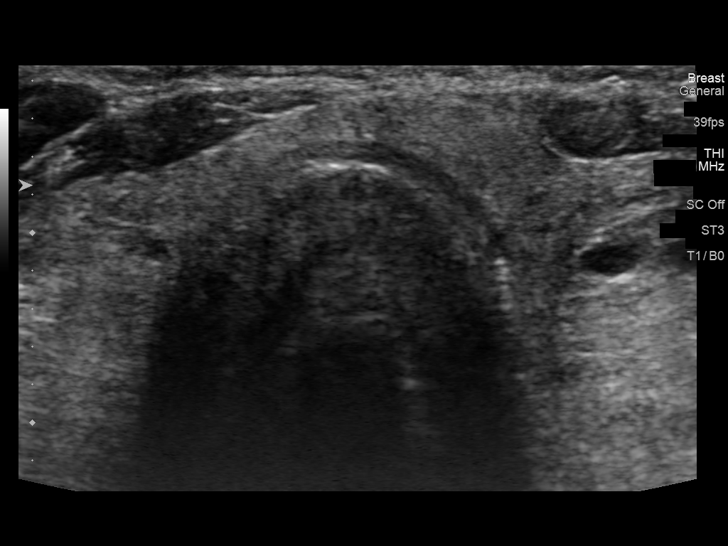
[im 8/47]
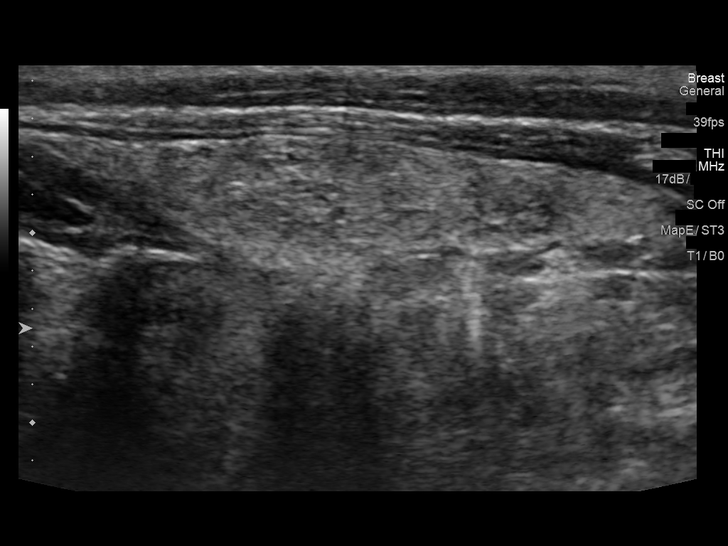
[im 12/47]
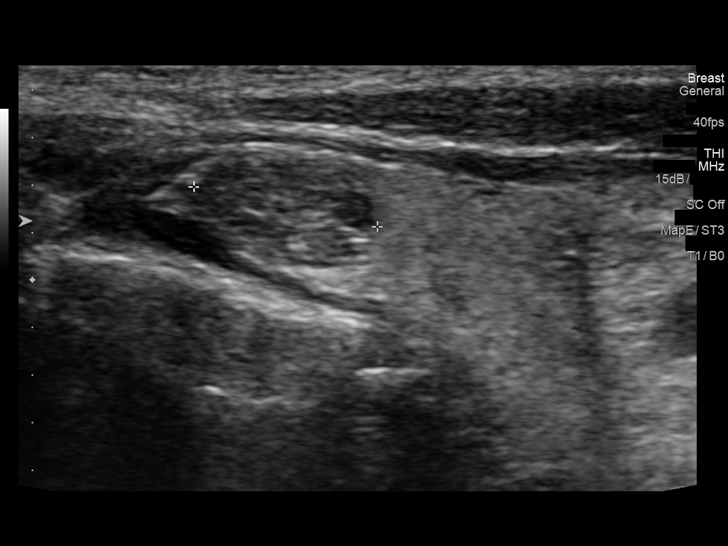
[im 16/47]
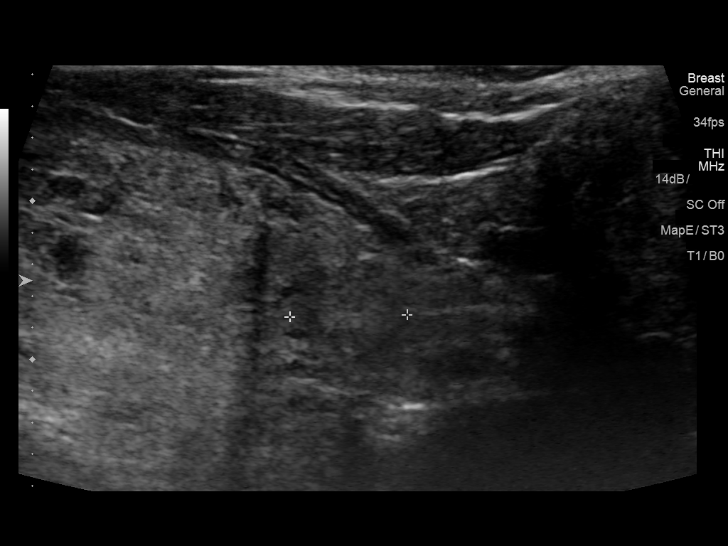
[im 20/47]
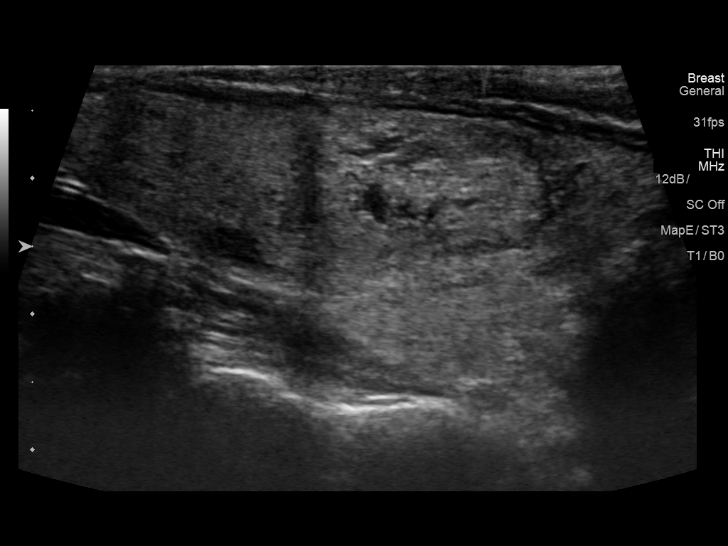
[im 24/47]
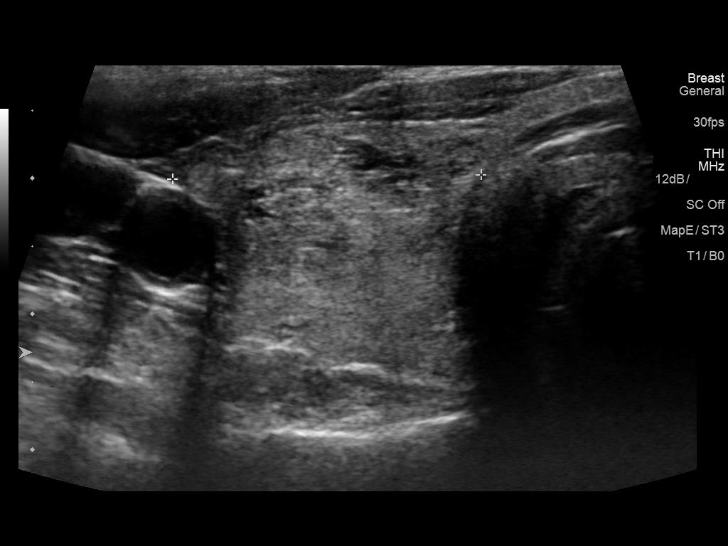
[im 27/47]
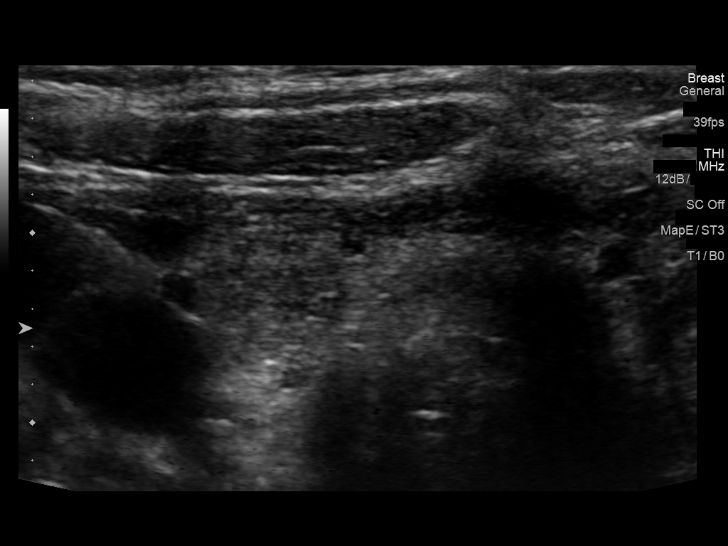
[im 31/47]
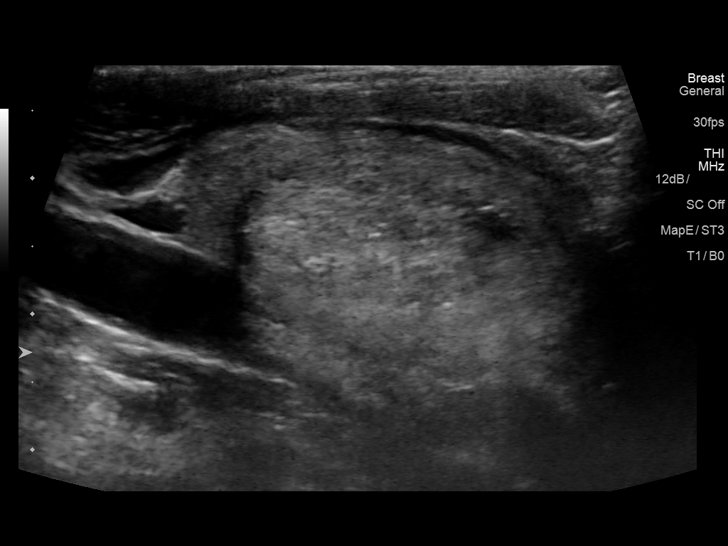
[im 35/47]
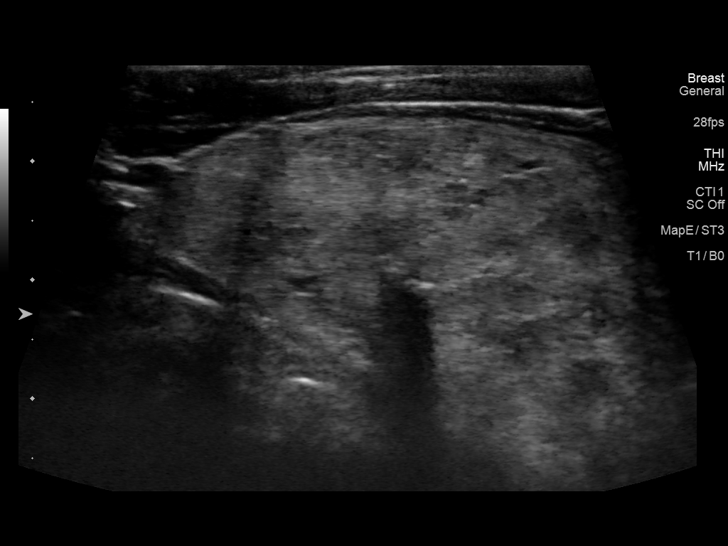
[im 39/47]
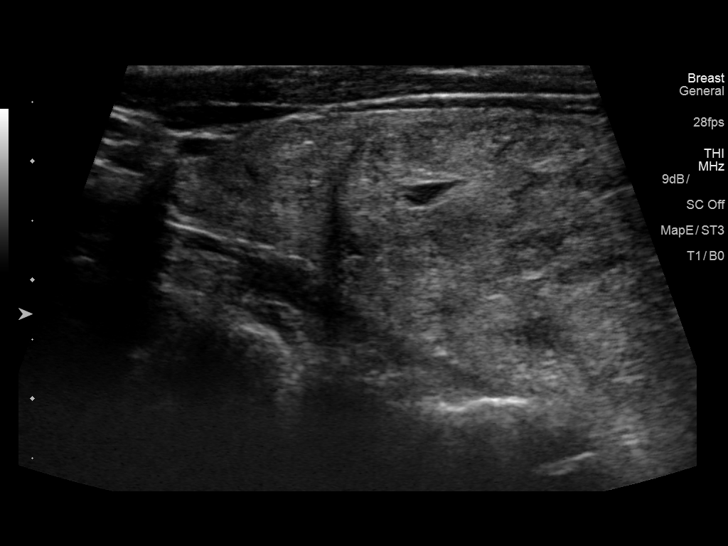
[im 43/47]
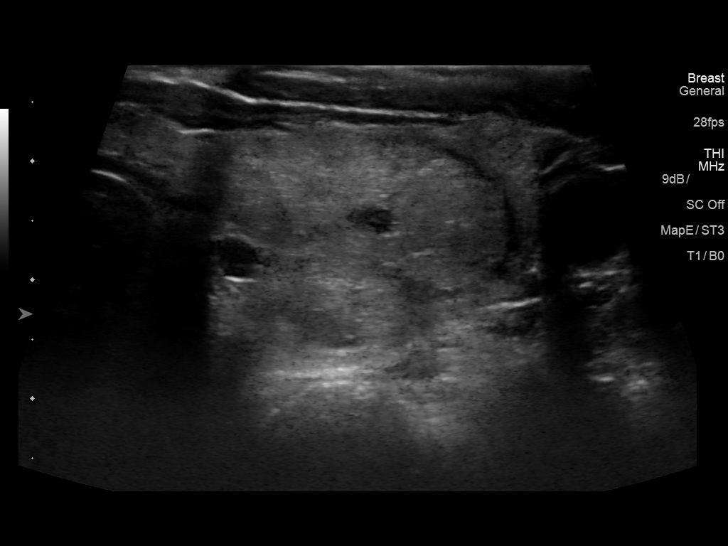
[im 47/47]
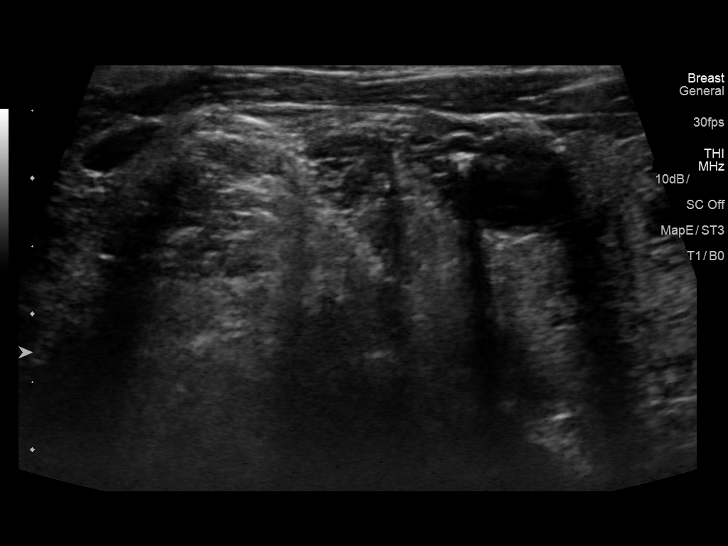

[13 of 25 positions shown; findings below may reference images not displayed]

FINDINGS: Parenchymal Echotexture: Mildly heterogenous

Isthmus: 0.3 cm, previously 0.3 cm

Right lobe: 5.7 x 2.4 x 2.3 cm, previously 6.2 x 2.4 x 1.9 cm

Left lobe: 5.3 x 2.9 x 3.2 cm, previously 5.4 x 3.0 x 3.4 cm

_________________________________________________________

Estimated total number of nodules >/= 1 cm: 3

Number of spongiform nodules >/= 2 cm not described below (TR1): 0

Number of mixed cystic and solid nodules >/= 1.5 cm not described
below (TR2): 0

_________________________________________________________

Nodule # 1:

Prior biopsy: No

Location: Right; Superior

Maximum size: 1.1 cm; Other 2 dimensions: 0.6 x 0.6 cm, previously,
1.0 cm

Composition: solid/almost completely solid (2)

Echogenicity: hypoechoic (2)

Shape: not taller-than-wide (0)

Margins: smooth (0)

Echogenic foci: none (0)

ACR TI-RADS total points: 4.

ACR TI-RADS risk category:  TR4 (4-6 points).

Significant change in size (>/= 20% in two dimensions and minimal
increase of 2 mm): No

Change in features: No

Change in ACR TI-RADS risk category: No

ACR TI-RADS recommendations:

*Given size (>/= 1 - 1.4 cm) and appearance, a follow-up ultrasound
in 1 year should be considered based on TI-RADS criteria. This
nodule is not significantly changed.

_________________________________________________________

Dominant right mid nodule measures up to 2.3 cm and previously
measured 2.1 cm. This was biopsied 08/28/2015.

Dominant left lower pole nodule measures 3.5 cm and previously
measured up to 3.3 cm. It was biopsied on 08/28/2015.

Right-sided smaller nodules measure up to 0.8 cm that are not
significantly changed or smaller than on the prior study. They have
a benign appearance.
IMPRESSION: Dominant nodules were previously biopsied and are not significantly
changed.

1.1 cm right upper pole nodule is not significantly changed as
described above.

The above is in keeping with the ACR TI-RADS recommendations - [HOSPITAL] 1039;[DATE].
FINDINGS: Parenchymal Echotexture:

Isthmus:

Right lobe:

Left lobe:

_________________________________________________________

Estimated total number of nodules >/= 1 cm:

Number of spongiform nodules >/=  2 cm not described below (TR1):

Number of mixed cystic and solid nodules >/= 1.5 cm not described
below (TR2):

_________________________________________________________

No discrete nodules are seen within the thyroid gland.
IMPRESSION: The above is in keeping with the ACR TI-RADS recommendations - [HOSPITAL] 1039;[DATE].

## 2018-02-03 DIAGNOSIS — Z1231 Encounter for screening mammogram for malignant neoplasm of breast: Secondary | ICD-10-CM | POA: Diagnosis not present

## 2018-02-03 LAB — HM MAMMOGRAPHY

## 2018-02-09 ENCOUNTER — Encounter: Payer: Self-pay | Admitting: *Deleted

## 2018-02-10 ENCOUNTER — Ambulatory Visit (INDEPENDENT_AMBULATORY_CARE_PROVIDER_SITE_OTHER): Payer: PPO | Admitting: Family Medicine

## 2018-02-10 DIAGNOSIS — Z23 Encounter for immunization: Secondary | ICD-10-CM

## 2018-03-15 ENCOUNTER — Other Ambulatory Visit: Payer: Self-pay | Admitting: Pharmacist

## 2018-03-15 ENCOUNTER — Other Ambulatory Visit: Payer: Self-pay | Admitting: Family Medicine

## 2018-03-15 MED ORDER — ATORVASTATIN CALCIUM 40 MG PO TABS: 40.0000 mg | ORAL_TABLET | Freq: Every day | ORAL | 0 refills | Status: DC

## 2018-03-15 NOTE — Telephone Encounter (Signed)
Courtesy refill until appointment 04/13/18.

## 2018-03-15 NOTE — Telephone Encounter (Signed)
Copied from [place] [id number]. Topic: Quick Communication - Rx Refill/Question >> Mar 15, 2018 10:31 AM Terry Armstrong L wrote: Medication: atorvastatin (LIPITOR) 40 MG tablet 90 day supply pt has an AWV December Terry Armstrong from [place] calling to get medicine for pt  Has the patient contacted their pharmacy? Yes.   (Agent: If no, request that the patient contact the pharmacy for the refill.) (Agent: If yes, when and what did the pharmacy advise?) told her to call provider because [facility] never heard a response back from provider [facility] states that she need a refill until appt for her Wellness visit  Preferred Pharmacy (with phone number or street name):   [place] ([address] [place] [phone] (Phone) [phone] (Fax)    Agent: Please be advised that RX refills may take up to 3 business days. We ask that you follow-up with your pharmacy.

## 2018-03-15 NOTE — Patient Outreach (Addendum)
[facility]) Care Management  03/15/2018  Paulena Servais 02-11-1946 [id number]   Incoming call from Jonn Shingles in response to the [facility] Medication Adherence Campaign. Speak with patient. HIPAA identifiers verified and verbal consent received.  Ms. Godden reports that she is currently out of her atorvastatin 40 mg. Reports that she requested a refill from her [place], but never heard back. Perform Epic chart review and note that patient's last refill from 11/18/17 included note "patient needs office visit for more refills". Note that patient has an upcoming office visit scheduled for 04/13/18. Counsel patient in the future to call her PCP office if she is having any difficulty with obtaining a refill through her pharmacy and on the importance of medication adherence.  PLAN  Will call/send mesage to follow up with Dr. Raul Del office to let the provider know that patient is currently out of her atorvastatin 40 mg, has scheduled an office visit for 04/13/18 and to request that the provider call in a 90 day supply (for cost savings) of the atorvastatin 40 mg to last the patient until her upcoming appointment.  Harlow Asa, PharmD, [facility] [phone]

## 2018-03-17 ENCOUNTER — Other Ambulatory Visit: Payer: Self-pay | Admitting: Pharmacist

## 2018-03-17 NOTE — Patient Outreach (Signed)
[facility]) Care Management  03/17/2018  Terry Armstrong Jan 10, 1946 [id number]   Perform Epic chart review and note that patient's prescription for atorvastatin 40 mg was renewed for a 30 day supply on 03/15/18, which should last until her upcoming office visit with her PCP on 04/13/18.  Call to follow up with Terry Armstrong to confirm that she is aware. Patient reports that she has picked up the prescription. Remind patient to request a renewal of the prescription, for a 90 day supply for cost savings, at her upcoming appointment.  Terry Armstrong denies any further medication questions/concerns at this time.  Will close pharmacy episode.  Harlow Asa, PharmD, [facility] [phone]

## 2018-04-07 ENCOUNTER — Ambulatory Visit: Payer: Self-pay | Admitting: *Deleted

## 2018-04-07 NOTE — Telephone Encounter (Signed)
Pt reports productive cough x 3 days. States clear phlegm. Also reports "Runny nose" clear mucous and throat scratchy, "Not sore." Denies any SOB, no wheezing. States afebrile. Has not taken anything OTC. Pt has appt 04/13/18, wanted to be seen earlier, no availability.  Directed pt to UC, declined. Home care advise given. Instructed to CB if SOB, fever, wheezing, CP occurs. Pt states she will go to UC if worsens. Also states she is going to pharmacy this evening for OTC meds. Instructed to speak to pharmacist for recommendations. Pt added to wait list.  Reason for Disposition . SEVERE coughing spells (e.g., whooping sound after coughing, vomiting after coughing)  Answer Assessment - Initial Assessment Questions 1. ONSET: "When did the cough begin?"      3 days ago 2. SEVERITY: "How bad is the cough today?"      severe 3. RESPIRATORY DISTRESS: "Describe your breathing."      WNL 4. FEVER: "Do you have a fever?" If so, ask: "What is your temperature, how was it measured, and when did it start?"     no 5. SPUTUM: "Describe the color of your sputum" (clear, white, yellow, green)     Clear from nose and cough 6. HEMOPTYSIS: "Are you coughing up any blood?" If so ask: "How much?" (flecks, streaks, tablespoons, etc.)     no 7. CARDIAC HISTORY: "Do you have any history of heart disease?" (e.g., heart attack, congestive heart failure)      HTN 8. LUNG HISTORY: "Do you have any history of lung disease?"  (e.g., pulmonary embolus, asthma, emphysema)     Emphysema 9. PE RISK FACTORS: "Do you have a history of blood clots?" (or: recent major surgery, recent prolonged travel, bedridden)     no 10. OTHER SYMPTOMS: "Do you have any other symptoms?" (e.g., runny nose, wheezing, chest pain)       Scratchy throat, not sore  Protocols used: COUGH - ACUTE PRODUCTIVE-A-AH

## 2018-04-13 ENCOUNTER — Encounter: Payer: Self-pay | Admitting: Family Medicine

## 2018-04-13 ENCOUNTER — Ambulatory Visit (INDEPENDENT_AMBULATORY_CARE_PROVIDER_SITE_OTHER): Payer: PPO | Admitting: Family Medicine

## 2018-04-13 ENCOUNTER — Encounter

## 2018-04-13 VITALS — BP 128/65 | HR 80 | Temp 98.5°F | Resp 18 | Ht 65.0 in | Wt 126.8 lb

## 2018-04-13 DIAGNOSIS — E785 Hyperlipidemia, unspecified: Secondary | ICD-10-CM

## 2018-04-13 DIAGNOSIS — R059 Cough, unspecified: Secondary | ICD-10-CM

## 2018-04-13 DIAGNOSIS — M810 Age-related osteoporosis without current pathological fracture: Secondary | ICD-10-CM | POA: Diagnosis not present

## 2018-04-13 DIAGNOSIS — R05 Cough: Secondary | ICD-10-CM | POA: Diagnosis not present

## 2018-04-13 DIAGNOSIS — Z1212 Encounter for screening for malignant neoplasm of rectum: Secondary | ICD-10-CM

## 2018-04-13 DIAGNOSIS — I10 Essential (primary) hypertension: Secondary | ICD-10-CM | POA: Diagnosis not present

## 2018-04-13 DIAGNOSIS — E119 Type 2 diabetes mellitus without complications: Secondary | ICD-10-CM | POA: Diagnosis not present

## 2018-04-13 DIAGNOSIS — Z1211 Encounter for screening for malignant neoplasm of colon: Secondary | ICD-10-CM | POA: Diagnosis not present

## 2018-04-13 LAB — POCT URINALYSIS DIP (MANUAL ENTRY)
BILIRUBIN UA: NEGATIVE mg/dL
Blood, UA: NEGATIVE
Glucose, UA: NEGATIVE mg/dL
Leukocytes, UA: NEGATIVE
Nitrite, UA: NEGATIVE
Protein Ur, POC: NEGATIVE mg/dL
SPEC GRAV UA: 1.025 (ref 1.010–1.025)
Urobilinogen, UA: 2 E.U./dL — AB
pH, UA: 6 (ref 5.0–8.0)

## 2018-04-13 LAB — POCT GLYCOSYLATED HEMOGLOBIN (HGB A1C): HEMOGLOBIN A1C: 6.2 % — AB (ref 4.0–5.6)

## 2018-04-13 MED ORDER — LOSARTAN POTASSIUM 25 MG PO TABS: 25.0000 mg | ORAL_TABLET | Freq: Every day | ORAL | 1 refills | Status: DC

## 2018-04-13 MED ORDER — ALBUTEROL SULFATE 108 (90 BASE) MCG/ACT IN AEPB: 2.0000 | INHALATION_SPRAY | RESPIRATORY_TRACT | 1 refills | Status: DC | PRN

## 2018-04-13 MED ORDER — AZITHROMYCIN 250 MG PO TABS: ORAL_TABLET | ORAL | 0 refills | Status: DC

## 2018-04-13 MED ORDER — BENZONATATE 200 MG PO CAPS: 200.0000 mg | ORAL_CAPSULE | Freq: Three times a day (TID) | ORAL | 0 refills | Status: DC | PRN

## 2018-04-13 MED ORDER — ATORVASTATIN CALCIUM 40 MG PO TABS: 40.0000 mg | ORAL_TABLET | Freq: Every day | ORAL | 1 refills | Status: DC

## 2018-04-13 MED ORDER — ALENDRONATE SODIUM 70 MG PO TABS: 70.0000 mg | ORAL_TABLET | ORAL | 3 refills | Status: DC

## 2018-04-13 NOTE — Patient Instructions (Addendum)
We recommend that you schedule a DEXA bone scan before our next visit.  [facility] [phone]     If you have lab work done today you will be contacted with your lab results within the next 2 weeks.  If you have not heard from [place] then please contact us. The fastest way to get your results is to register for My Chart.   IF you received an x-ray today, you will receive an invoice from [facility]. Please contact [facility] at [phone] with questions or concerns regarding your invoice.   IF you received labwork today, you will receive an invoice from [facility]. Please contact [facility] at [phone] with questions or concerns regarding your invoice.   Our billing staff will not be able to assist you with questions regarding bills from these companies.  You will be contacted with the lab results as soon as they are available. The fastest way to get your results is to activate your My Chart account. Instructions are located on the last page of this paperwork. If you have not heard from [place] regarding the results in 2 weeks, please contact this office.     Acute Bronchitis, Adult Acute bronchitis is sudden (acute) swelling of the air tubes (bronchi) in the lungs. Acute bronchitis causes these tubes to fill with mucus, which can make it hard to breathe. It can also cause coughing or wheezing. In adults, acute bronchitis usually goes away within 2 weeks. A cough caused by bronchitis may last up to 3 weeks. Smoking, allergies, and asthma can make the condition worse. Repeated episodes of bronchitis may cause further lung problems, such as chronic obstructive pulmonary disease (COPD). What are the causes? This condition can be caused by germs and by substances that irritate the lungs, including:  Cold and flu viruses. This condition is most often caused by the same virus that causes a cold.  Bacteria.  Exposure to tobacco smoke, dust, fumes, and air  pollution.  What increases the risk? This condition is more likely to develop in people who:  Have close contact with someone with acute bronchitis.  Are exposed to lung irritants, such as tobacco smoke, dust, fumes, and vapors.  Have a weak immune system.  Have a respiratory condition such as asthma.  What are the signs or symptoms? Symptoms of this condition include:  A cough.  Coughing up clear, yellow, or green mucus.  Wheezing.  Chest congestion.  Shortness of breath.  A fever.  Body aches.  Chills.  A sore throat.  How is this diagnosed? This condition is usually diagnosed with a physical exam. During the exam, your health care provider may order tests, such as chest X-rays, to rule out other conditions. He or she may also:  Test a sample of your mucus for bacterial infection.  Check the level of oxygen in your blood. This is done to check for pneumonia.  Do a chest X-ray or lung function testing to rule out pneumonia and other conditions.  Perform blood tests.  Your health care provider will also ask about your symptoms and medical history. How is this treated? Most cases of acute bronchitis clear up over time without treatment. Your health care provider may recommend:  Drinking more fluids. Drinking more makes your mucus thinner, which may make it easier to breathe.  Taking a medicine for a fever or cough.  Taking an antibiotic medicine.  Using an inhaler to help improve shortness of breath and to control a  cough.  Using a cool mist vaporizer or humidifier to make it easier to breathe.  Follow these instructions at home: Medicines  Take over-the-counter and prescription medicines only as told by your health care provider.  If you were prescribed an antibiotic, take it as told by your health care provider. Do not stop taking the antibiotic even if you start to feel better. General instructions  Get plenty of rest.  Drink enough fluids to  keep your urine clear or pale yellow.  Avoid smoking and secondhand smoke. Exposure to cigarette smoke or irritating chemicals will make bronchitis worse. If you smoke and you need help quitting, ask your health care provider. Quitting smoking will help your lungs heal faster.  Use an inhaler, cool mist vaporizer, or humidifier as told by your health care provider.  Keep all follow-up visits as told by your health care provider. This is important. How is this prevented? To lower your risk of getting this condition again:  Wash your hands often with soap and water. If soap and water are not available, use hand sanitizer.  Avoid contact with people who have cold symptoms.  Try not to touch your hands to your mouth, nose, or eyes.  Make sure to get the flu shot every year.  Contact a health care provider if:  Your symptoms do not improve in 2 weeks of treatment. Get help right away if:  You cough up blood.  You have chest pain.  You have severe shortness of breath.  You become dehydrated.  You faint or keep feeling like you are going to faint.  You keep vomiting.  You have a severe headache.  Your fever or chills gets worse. This information is not intended to replace advice given to you by your health care provider. Make sure you discuss any questions you have with your health care provider. Document Released: 05/29/2004 Document Revised: 11/14/2015 Document Reviewed: 10/10/2015 Elsevier Interactive Patient Education  [employer].

## 2018-04-13 NOTE — Progress Notes (Signed)
Subjective:    Patient: Terry Armstrong  DOB: 07/11/1945; 72 y.o.   MRN: [id number]  Chief Complaint  Patient presents with  . Medication Refill  . Cough    HPI  Feeling ill. Cough and congestion. No f/c. Eating well. Cough productive and keeping her from sleep.  Not fasting today.  Medical History Past Medical History:  Diagnosis Date  . Allergy   . Breast abscess   . Constipation    if drinks alot of water no issues - uses womens stool softener PRN only   . Diabetes mellitus without complication ([place])    pt has no meds- diet controlled only   . Hx of adenomatous colonic polyps 06/19/2017  . Hyperlipidemia   . Primary snoring    Past Surgical History:  Procedure Laterality Date  . ABDOMINAL HYSTERECTOMY  1993  . COLONOSCOPY    . POLYPECTOMY     Current Outpatient Medications on File Prior to Visit  Medication Sig Dispense Refill  . Albuterol Sulfate (PROAIR RESPICLICK) 628 (90 Base) MCG/ACT AEPB Inhale 2 puffs into the lungs every 4 (four) hours as needed. 1 each 1  . alendronate (FOSAMAX) 70 MG tablet Take 1 tablet (70 mg total) every 7 (seven) days by mouth. Take with a full glass of water on an empty stomach. 12 tablet 3  . atorvastatin (LIPITOR) 40 MG tablet Take 1 tablet (40 mg total) by mouth daily. Patient needs office visit for more refills 30 tablet 0  . cetirizine-pseudoephedrine (ZYRTEC-D) 5-120 MG tablet Take 1 tablet by mouth 2 (two) times daily. 20 tablet 0  . fluticasone (FLONASE) 50 MCG/ACT nasal spray Place 2 sprays into both nostrils at bedtime. 16 g 2  . losartan (COZAAR) 25 MG tablet TAKE 1 TABLET BY MOUTH ONCE DAILY 90 tablet 1  . multivitamin-iron-minerals-folic acid (CENTRUM) chewable tablet Chew 1 tablet by mouth daily.    . Omega-3 Fatty Acids (FISH OIL) 1200 MG CAPS Take 1 capsule (1,200 mg total) by mouth 2 (two) times daily. 60 capsule 3  . POTASSIUM CITRATE PO Take by mouth daily.     No current facility-administered medications on file  prior to visit.    Allergies  Allergen Reactions  . Aspirin     Swelling, scratching in throat   Family History  Problem Relation Age of Onset  . Diabetes Mother   . Diabetes Father   . Diabetes Unknown   . Hypertension Sister   . Hyperlipidemia Unknown   . Heart disease Sister        defibrillator  . Kidney failure Maternal Aunt        dialysis  . Diabetes Maternal Aunt   . Hypertension Maternal Aunt   . Diabetes Maternal Aunt   . Heart Problems Maternal Grandmother   . Cancer Maternal Grandmother   . Heart disease Maternal Grandmother   . Hypertension Maternal Grandmother   . Diabetes Brother   . Colon cancer Neg Hx   . Colon polyps Neg Hx   . Rectal cancer Neg Hx   . Stomach cancer Neg Hx    Social History   Socioeconomic History  . Marital status: Married    Spouse name: Not on file  . Number of children: 0  . Years of education: 17  . Highest education level: Not on file  Occupational History    Employer: RETIRED  Social Needs  . Financial resource strain: Not on file  . Food insecurity:    Worry: Not on  file    Inability: Not on file  . Transportation needs:    Medical: Not on file    Non-medical: Not on file  Tobacco Use  . Smoking status: Current Every Day Smoker    Packs/day: 0.30    Years: 34.00    Pack years: 10.20    Types: Cigarettes  . Smokeless tobacco: Never Used  . Tobacco comment: Patient states that she would love to quit smoking- trying to quit- has decreased to less than a pack a day   Substance and Sexual Activity  . Alcohol use: Yes    Comment: social  . Drug use: No  . Sexual activity: Not on file  Lifestyle  . Physical activity:    Days per week: Not on file    Minutes per session: Not on file  . Stress: Not on file  Relationships  . Social connections:    Talks on phone: Not on file    Gets together: Not on file    Attends religious service: Not on file    Active member of club or organization: Not on file    Attends  meetings of clubs or organizations: Not on file    Relationship status: Not on file  Other Topics Concern  . Not on file  Social History Narrative   Patient lives with her significant other.   Patient is retired.   Patient drinks 2-3 cups of caffeine daily in the winter.   Patient has a college education.   Patient is right-handed.         Depression screen [facility] 2/9 04/13/2018 07/30/2017 03/16/2017 03/03/2017 01/23/2017  Decreased Interest 0 0 0 0 0  Down, Depressed, Hopeless 0 0 0 0 0  PHQ - 2 Score 0 0 0 0 0  Altered sleeping 0 - - - -  Tired, decreased energy 0 - - - -  Change in appetite 0 - - - -  Feeling bad or failure about yourself  0 - - - -  Trouble concentrating 0 - - - -  Moving slowly or fidgety/restless 0 - - - -  Suicidal thoughts 0 - - - -  PHQ-9 Score 0 - - - -    ROS As noted in HPI  Objective:  BP 128/65 (BP Location: Right Arm, Patient Position: Sitting, Cuff Size: Normal)   Pulse 80   Temp 98.5 F (36.9 C) (Oral)   Resp 18   Ht 5\' 5"  (1.651 m)   Wt 126 lb 12.8 oz (57.5 kg)   SpO2 100%   BMI 21.10 kg/m  Physical Exam Constitutional:      General: She is not in acute distress.    Appearance: She is well-developed. She is ill-appearing. She is not diaphoretic.  HENT:     Head: Normocephalic and atraumatic.     Right Ear: Ear canal and external ear normal. No middle ear effusion. Tympanic membrane is retracted.     Left Ear: Ear canal and external ear normal.  No middle ear effusion. Tympanic membrane is retracted.     Nose: Mucosal edema and rhinorrhea present.     Right Sinus: No maxillary sinus tenderness.     Left Sinus: No maxillary sinus tenderness.     Mouth/Throat:     Pharynx: Uvula midline. Posterior oropharyngeal erythema present. No oropharyngeal exudate.     Tonsils: No tonsillar abscesses.  Eyes:     General: No scleral icterus.       Right eye: No  discharge.        Left eye: No discharge.     Conjunctiva/sclera: Conjunctivae  normal.  Neck:     Musculoskeletal: Normal range of motion and neck supple.  Cardiovascular:     Rate and Rhythm: Normal rate and regular rhythm.     Heart sounds: Normal heart sounds.  Pulmonary:     Effort: Pulmonary effort is normal.     Breath sounds: Normal breath sounds.  Lymphadenopathy:     Head:     Right side of head: Submandibular adenopathy present. No preauricular or posterior auricular adenopathy.     Left side of head: Submandibular adenopathy present. No preauricular or posterior auricular adenopathy.     Cervical: No cervical adenopathy.     Upper Body:     Right upper body: No supraclavicular adenopathy.     Left upper body: No supraclavicular adenopathy.  Skin:    General: Skin is warm and dry.     Findings: No erythema.  Neurological:     Mental Status: She is oriented to person, place, and time. She is lethargic.  Psychiatric:        Behavior: Behavior normal.     [facility] Office Visit on 04/13/2018  Component Date Value Ref Range Status  . Hemoglobin A1C 04/13/2018 6.2* 4.0 - 5.6 % Final  . Color, UA 04/13/2018 yellow  yellow Final  . Clarity, UA 04/13/2018 clear  clear Final  . Glucose, UA 04/13/2018 negative  negative mg/dL Final  . Bilirubin, UA 04/13/2018 small* negative Final  . Ketones, POC UA 04/13/2018 negative  negative mg/dL Final  . Spec Grav, UA 04/13/2018 1.025  1.010 - 1.025 Final  . Blood, UA 04/13/2018 negative  negative Final  . pH, UA 04/13/2018 6.0  5.0 - 8.0 Final  . Protein Ur, POC 04/13/2018 negative  negative mg/dL Final  . Urobilinogen, UA 04/13/2018 2.0* 0.2 or 1.0 E.U./dL Final  . Nitrite, UA 04/13/2018 Negative  Negative Final  . Leukocytes, UA 04/13/2018 Negative  Negative Final    Diabetic Foot Exam - Simple   Simple Foot Form Diabetic Foot exam was performed with the following findings:  Yes 04/13/2018 12:54 PM  Visual Inspection No deformities, no ulcerations, no other skin breakdown bilaterally:  Yes Sensation  Testing Intact to touch and monofilament testing bilaterally:  Yes Pulse Check Posterior Tibialis and Dorsalis pulse intact bilaterally:  Yes Comments      Assessment & Plan:   1. Type 2 diabetes mellitus without complication, without long-term current use of insulin ([place]) - very well controlled.  2. Essential hypertension   3. Hyperlipidemia LDL goal <70   4. Age related osteoporosis, unspecified pathological fracture presence - needs repeat DEXA since has started on fosamax at [facility]  5. Screening for colorectal cancer   6. Cough - ?walking pna- zpack with prn benzonatate and albuterol. Cont mucinex    Patient will continue on current chronic medications other than changes noted above, so ok to refill when needed.   See after visit summary for patient specific instructions.  Orders Placed This Encounter  Procedures  . DG Bone Density    Standing Status:   Future    Standing Expiration Date:   06/15/2019    Order Specific Question:   Reason for Exam (SYMPTOM  OR DIAGNOSIS REQUIRED)    Answer:   f/u osteoporosis, estrogen deficiency    Order Specific Question:   Preferred imaging location?    Answer:   External  Comments:   [facility]  . Comprehensive metabolic panel    Order Specific Question:   Has the patient fasted?    Answer:   Yes  . Microalbumin/Creatinine Ratio, Urine  . LDL Cholesterol, Direct  . POCT glycosylated hemoglobin (Hb A1C)  . POCT urinalysis dipstick  . POCT CBC  . HM DIABETES FOOT EXAM    Meds ordered this encounter  Medications  . azithromycin (ZITHROMAX) 250 MG tablet    Sig: Take 2 tabs PO x 1 dose, then 1 tab PO QD x 4 days    Dispense:  6 tablet    Refill:  0  . benzonatate (TESSALON) 200 MG capsule    Sig: Take 1 capsule (200 mg total) by mouth 3 (three) times daily as needed for cough.    Dispense:  40 capsule    Refill:  0  . Albuterol Sulfate (PROAIR RESPICLICK) 161 (90 Base) MCG/ACT AEPB    Sig: Inhale 2 puffs into the lungs every 4  (four) hours as needed.    Dispense:  1 each    Refill:  1  . atorvastatin (LIPITOR) 40 MG tablet    Sig: Take 1 tablet (40 mg total) by mouth daily.    Dispense:  90 tablet    Refill:  1  . alendronate (FOSAMAX) 70 MG tablet    Sig: Take 1 tablet (70 mg total) by mouth every 7 (seven) days. Take with a full glass of water on an empty stomach.    Dispense:  12 tablet    Refill:  3  . losartan (COZAAR) 25 MG tablet    Sig: Take 1 tablet (25 mg total) by mouth daily.    Dispense:  90 tablet    Refill:  1    Patient verbalized to me that they understand the following: diagnosis, what is being done for them, what to expect and what should be done at home.  Their questions have been answered. They understand that I am unable to predict every possible medication interaction or adverse outcome and that if any unexpected symptoms arise, they should contact us and their pharmacist, as well as never hesitate to seek urgent/emergent care at [facility] or ER if they think it might be warranted.    Delman Cheadle, MD, MPH Primary Care at [facility] [address] [place], [place]  [address] [phone] Office phone  [phone] Office fax  04/13/18 12:07 PM

## 2018-04-14 LAB — COMPREHENSIVE METABOLIC PANEL
ALT: 14 IU/L (ref 0–32)
AST: 15 IU/L (ref 0–40)
Albumin/Globulin Ratio: 2 (ref 1.2–2.2)
Albumin: 4.2 g/dL (ref 3.5–4.8)
Alkaline Phosphatase: 70 IU/L (ref 39–117)
BUN/Creatinine Ratio: 15 (ref 12–28)
BUN: 8 mg/dL (ref 8–27)
Bilirubin Total: 0.3 mg/dL (ref 0.0–1.2)
CO2: 22 mmol/L (ref 20–29)
Calcium: 9 mg/dL (ref 8.7–10.3)
Chloride: 106 mmol/L (ref 96–106)
Creatinine, Ser: 0.54 mg/dL — ABNORMAL LOW (ref 0.57–1.00)
GFR calc non Af Amer: 94 mL/min/{1.73_m2} (ref >59)
GFR, EST AFRICAN AMERICAN: 108 mL/min/{1.73_m2} (ref >59)
Globulin, Total: 2.1 g/dL (ref 1.5–4.5)
Glucose: 97 mg/dL (ref 65–99)
POTASSIUM: 4.1 mmol/L (ref 3.5–5.2)
Sodium: 142 mmol/L (ref 134–144)
Total Protein: 6.3 g/dL (ref 6.0–8.5)

## 2018-04-14 LAB — MICROALBUMIN / CREATININE URINE RATIO
Creatinine, Urine: 237 mg/dL
MICROALB/CREAT RATIO: 7.5 mg/g{creat} (ref 0.0–30.0)
MICROALBUM., U, RANDOM: 17.7 ug/mL

## 2018-04-22 ENCOUNTER — Other Ambulatory Visit: Payer: Self-pay | Admitting: Family Medicine

## 2018-04-22 NOTE — Telephone Encounter (Signed)
Patient called, left VM to return call to the office to discuss refill request from the pharmacy. Will patient need a refill on Azithromycin, if so an appointment will be needed.

## 2018-04-29 ENCOUNTER — Other Ambulatory Visit: Payer: Self-pay

## 2018-04-29 ENCOUNTER — Encounter: Payer: Self-pay | Admitting: Family Medicine

## 2018-04-29 ENCOUNTER — Ambulatory Visit (INDEPENDENT_AMBULATORY_CARE_PROVIDER_SITE_OTHER): Payer: PPO | Admitting: Family Medicine

## 2018-04-29 VITALS — BP 122/65 | HR 95 | Temp 97.7°F | Resp 16 | Ht 64.76 in | Wt 126.0 lb

## 2018-04-29 DIAGNOSIS — E785 Hyperlipidemia, unspecified: Secondary | ICD-10-CM

## 2018-04-29 DIAGNOSIS — E041 Nontoxic single thyroid nodule: Secondary | ICD-10-CM | POA: Diagnosis not present

## 2018-04-29 DIAGNOSIS — E042 Nontoxic multinodular goiter: Secondary | ICD-10-CM | POA: Diagnosis not present

## 2018-04-29 DIAGNOSIS — R05 Cough: Secondary | ICD-10-CM | POA: Diagnosis not present

## 2018-04-29 DIAGNOSIS — E119 Type 2 diabetes mellitus without complications: Secondary | ICD-10-CM | POA: Diagnosis not present

## 2018-04-29 DIAGNOSIS — F17218 Nicotine dependence, cigarettes, with other nicotine-induced disorders: Secondary | ICD-10-CM

## 2018-04-29 DIAGNOSIS — R7989 Other specified abnormal findings of blood chemistry: Secondary | ICD-10-CM

## 2018-04-29 DIAGNOSIS — J432 Centrilobular emphysema: Secondary | ICD-10-CM

## 2018-04-29 DIAGNOSIS — J441 Chronic obstructive pulmonary disease with (acute) exacerbation: Secondary | ICD-10-CM | POA: Diagnosis not present

## 2018-04-29 DIAGNOSIS — R059 Cough, unspecified: Secondary | ICD-10-CM

## 2018-04-29 LAB — POCT CBC
Granulocyte percent: 5.1 %G — AB (ref 37–80)
HCT, POC: 37.8 % (ref 29–41)
Hemoglobin: 12.7 g/dL (ref 11–14.6)
Lymph, poc: 28.7 — AB (ref 0.6–3.4)
MCH: 32.3 pg — AB (ref 27–31.2)
MCHC: 33.7 g/dL (ref 31.8–35.4)
MCV: 95.8 fL (ref 76–111)
MID (cbc): 65.4 — AB (ref 0–0.9)
MPV: 7.7 fL (ref 0–99.8)
POC Granulocyte: 0.5 — AB (ref 2–6.9)
POC LYMPH PERCENT: 5.9 %L — AB (ref 10–50)
POC MID %: 2.2 %M (ref 0–12)
Platelet Count, POC: 253 10*3/uL (ref 142–424)
RBC: 3.95 M/uL — AB (ref 4.04–5.48)
RDW, POC: 13.9 %
WBC: 7.8 10*3/uL (ref 4.6–10.2)

## 2018-04-29 MED ORDER — HYDROCODONE-HOMATROPINE 5-1.5 MG/5ML PO SYRP: 5.0000 mL | ORAL_SOLUTION | Freq: Three times a day (TID) | ORAL | 0 refills | Status: DC | PRN

## 2018-04-29 MED ORDER — DOXYCYCLINE HYCLATE 100 MG PO TABS: 100.0000 mg | ORAL_TABLET | Freq: Two times a day (BID) | ORAL | 0 refills | Status: DC

## 2018-04-29 MED ORDER — PREDNISONE 20 MG PO TABS: 40.0000 mg | ORAL_TABLET | Freq: Every day | ORAL | 0 refills | Status: DC

## 2018-04-29 NOTE — Progress Notes (Signed)
Subjective:    Patient: Terry Armstrong  DOB: 1946-03-07; 72 y.o.   MRN: [id number]  Chief Complaint  Patient presents with  . Cough    x 2 weeks with mucus   . Nasal Congestion    HPI Saw pt 16d ago - has h/o COPD with emphysema.  Coughing up mucous. Is chilled. Cough is productive. Had initial improvement with antibiotic but then secondary worsening sev d after eating antibiotic. Is having rhinitis and nasal congestion but able to move it. Using nasal spray and inhaler prn but not needing it every day.  No wheezing/tightness/ShoB. Coughing a lot all through the night esp which keeps her from sleep. Benzonatate was completely ineffectivey  Medical History Past Medical History:  Diagnosis Date  . Allergy   . Breast abscess   . Constipation    if drinks alot of water no issues - uses womens stool softener PRN only   . Diabetes mellitus without complication ([place])    pt has no meds- diet controlled only   . Hx of adenomatous colonic polyps 06/19/2017  . Hyperlipidemia   . Primary snoring    Past Surgical History:  Procedure Laterality Date  . ABDOMINAL HYSTERECTOMY  1993  . COLONOSCOPY    . POLYPECTOMY     Current Outpatient Medications on File Prior to Visit  Medication Sig Dispense Refill  . Albuterol Sulfate (PROAIR RESPICLICK) 702 (90 Base) MCG/ACT AEPB Inhale 2 puffs into the lungs every 4 (four) hours as needed. 1 each 1  . alendronate (FOSAMAX) 70 MG tablet Take 1 tablet (70 mg total) by mouth every 7 (seven) days. Take with a full glass of water on an empty stomach. 12 tablet 3  . atorvastatin (LIPITOR) 40 MG tablet Take 1 tablet (40 mg total) by mouth daily. 90 tablet 1  . fluticasone (FLONASE) 50 MCG/ACT nasal spray Place 2 sprays into both nostrils at bedtime. 16 g 2  . losartan (COZAAR) 25 MG tablet Take 1 tablet (25 mg total) by mouth daily. 90 tablet 1  . multivitamin-iron-minerals-folic acid (CENTRUM) chewable tablet Chew 1 tablet by mouth daily.    . Omega-3  Fatty Acids (FISH OIL) 1200 MG CAPS Take 1 capsule (1,200 mg total) by mouth 2 (two) times daily. 60 capsule 3  . POTASSIUM CITRATE PO Take by mouth daily.     No current facility-administered medications on file prior to visit.    Allergies  Allergen Reactions  . Aspirin     Swelling, scratching in throat   Family History  Problem Relation Age of Onset  . Diabetes Mother   . Diabetes Father   . Diabetes Other   . Hypertension Sister   . Hyperlipidemia Other   . Heart disease Sister        defibrillator  . Kidney failure Maternal Aunt        dialysis  . Diabetes Maternal Aunt   . Hypertension Maternal Aunt   . Diabetes Maternal Aunt   . Heart Problems Maternal Grandmother   . Cancer Maternal Grandmother   . Heart disease Maternal Grandmother   . Hypertension Maternal Grandmother   . Diabetes Brother   . Colon cancer Neg Hx   . Colon polyps Neg Hx   . Rectal cancer Neg Hx   . Stomach cancer Neg Hx    Social History   Socioeconomic History  . Marital status: Married    Spouse name: Not on file  . Number of children: 0  . Years  of education: 14  . Highest education level: Not on file  Occupational History    Employer: RETIRED  Social Needs  . Financial resource strain: Not on file  . Food insecurity:    Worry: Not on file    Inability: Not on file  . Transportation needs:    Medical: Not on file    Non-medical: Not on file  Tobacco Use  . Smoking status: Current Every Day Smoker    Packs/day: 0.30    Years: 34.00    Pack years: 10.20    Types: Cigarettes  . Smokeless tobacco: Never Used  . Tobacco comment: Patient states that she would love to quit smoking- trying to quit- has decreased to less than a pack a day   Substance and Sexual Activity  . Alcohol use: Yes    Comment: social  . Drug use: No  . Sexual activity: Not on file  Lifestyle  . Physical activity:    Days per week: Not on file    Minutes per session: Not on file  . Stress: Not on file    Relationships  . Social connections:    Talks on phone: Not on file    Gets together: Not on file    Attends religious service: Not on file    Active member of club or organization: Not on file    Attends meetings of clubs or organizations: Not on file    Relationship status: Not on file  Other Topics Concern  . Not on file  Social History Narrative   Patient lives with her significant other.   Patient is retired.   Patient drinks 2-3 cups of caffeine daily in the winter.   Patient has a college education.   Patient is right-handed.         Depression screen [facility] 2/9 04/29/2018 04/13/2018 07/30/2017 03/16/2017 03/03/2017  Decreased Interest 0 0 0 0 0  Down, Depressed, Hopeless 0 0 0 0 0  PHQ - 2 Score 0 0 0 0 0  Altered sleeping - 0 - - -  Tired, decreased energy - 0 - - -  Change in appetite - 0 - - -  Feeling bad or failure about yourself  - 0 - - -  Trouble concentrating - 0 - - -  Moving slowly or fidgety/restless - 0 - - -  Suicidal thoughts - 0 - - -  PHQ-9 Score - 0 - - -    ROS As noted in HPI  Objective:  BP 122/65   Pulse 95   Temp 97.7 F (36.5 C) (Oral)   Resp 16   Ht 5' 4.76" (1.645 m)   Wt 126 lb (57.2 kg)   SpO2 95%   BMI 21.12 kg/m  Physical Exam Constitutional:      General: She is not in acute distress.    Appearance: She is well-developed. She is ill-appearing. She is not diaphoretic.  HENT:     Head: Normocephalic and atraumatic.     Right Ear: Ear canal and external ear normal. A middle ear effusion is present. Tympanic membrane is retracted.     Left Ear: Ear canal and external ear normal. A middle ear effusion is present. Tympanic membrane is retracted.     Nose: Mucosal edema and rhinorrhea present.     Right Sinus: Maxillary sinus tenderness present.     Left Sinus: Maxillary sinus tenderness present.     Mouth/Throat:     Pharynx: Uvula midline. Posterior oropharyngeal  erythema present. No oropharyngeal exudate.     Tonsils: No  tonsillar abscesses.  Eyes:     General: No scleral icterus.       Right eye: No discharge.        Left eye: No discharge.     Conjunctiva/sclera: Conjunctivae normal.  Neck:     Musculoskeletal: Normal range of motion and neck supple.  Cardiovascular:     Rate and Rhythm: Normal rate and regular rhythm.     Heart sounds: Normal heart sounds.  Pulmonary:     Effort: Pulmonary effort is normal.     Breath sounds: Normal breath sounds.  Lymphadenopathy:     Head:     Right side of head: Submandibular adenopathy present. No preauricular or posterior auricular adenopathy.     Left side of head: Submandibular adenopathy present. No preauricular or posterior auricular adenopathy.     Cervical: No cervical adenopathy.     Upper Body:     Right upper body: No supraclavicular adenopathy.     Left upper body: No supraclavicular adenopathy.  Skin:    General: Skin is warm and dry.     Findings: No erythema.  Neurological:     Mental Status: She is oriented to person, place, and time. She is lethargic.  Psychiatric:        Behavior: Behavior normal.     [facility] TEST[facility] Office Visit on 04/29/2018  Component Date Value Ref Range Status  . WBC 04/29/2018 7.8  4.6 - 10.2 K/uL Final  . Lymph, poc 04/29/2018 28.7* 0.6 - 3.4 Final  . POC LYMPH PERCENT 04/29/2018 5.9* 10 - 50 %L Final  . MID (cbc) 04/29/2018 65.4* 0 - 0.9 Final  . POC MID % 04/29/2018 2.2  0 - 12 %M Final  . POC Granulocyte 04/29/2018 0.5* 2 - 6.9 Final  . Granulocyte percent 04/29/2018 5.1* 37 - 80 %G Final  . RBC 04/29/2018 3.95* 4.04 - 5.48 M/uL Final  . Hemoglobin 04/29/2018 12.7  11 - 14.6 g/dL Final  . HCT, POC 04/29/2018 37.8  29 - 41 % Final  . MCV 04/29/2018 95.8  76 - 111 fL Final  . MCH, POC 04/29/2018 32.3* 27 - 31.2 pg Final  . MCHC 04/29/2018 33.7  31.8 - 35.4 g/dL Final  . RDW, POC 04/29/2018 13.9  % Final  . Platelet Count, POC 04/29/2018 253  142 - 424 K/uL Final  . MPV 04/29/2018 7.7  0 - 99.8 fL Final  .  LDL Direct 04/29/2018 81  0 - 99 mg/dL Final  . TSH 04/29/2018 0.191* 0.450 - 4.500 uIU/mL Final  . T3, Free 04/29/2018 3.0  2.0 - 4.4 pg/mL Final  . Free T4 04/29/2018 1.36  0.82 - 1.77 ng/dL Final     Assessment & Plan:   1. Type 2 diabetes mellitus without complication, without long-term current use of insulin ([place])   2. Hyperlipidemia LDL goal <70   3. Cigarette nicotine dependence with other nicotine-induced disorder   4. Cough   5. Centrilobular emphysema ([place])   6. Thyroid nodule   7. Abnormal TSH   8. Multinodular goiter   9. COPD exacerbation ([place])    Patient will continue on current chronic medications other than changes noted above, so ok to refill when needed.   See after visit summary for patient specific instructions.  Orders Placed This Encounter  Procedures  . LDL Cholesterol, Direct  . TSH+T4F+T3Free  . POCT CBC    Meds ordered this  encounter  Medications  . predniSONE (DELTASONE) 20 MG tablet    Sig: Take 2 tablets (40 mg total) by mouth daily with breakfast.    Dispense:  10 tablet    Refill:  0  . doxycycline (VIBRA-TABS) 100 MG tablet    Sig: Take 1 tablet (100 mg total) by mouth 2 (two) times daily.    Dispense:  14 tablet    Refill:  0  . HYDROcodone-homatropine (HYCODAN) 5-1.5 MG/5ML syrup    Sig: Take 5 mLs by mouth every 8 (eight) hours as needed for cough.    Dispense:  120 mL    Refill:  0    Patient verbalized to me that they understand the following: diagnosis, what is being done for them, what to expect and what should be done at home.  Their questions have been answered. They understand that I am unable to predict every possible medication interaction or adverse outcome and that if any unexpected symptoms arise, they should contact us and their pharmacist, as well as never hesitate to seek urgent/emergent care at [facility] or ER if they think it might be warranted.    Delman Cheadle, MD, MPH Primary Care at [facility] [place], [place]  [address] [phone] Office phone  [phone] Office fax  04/29/18 1:17 PM

## 2018-04-29 NOTE — Patient Instructions (Addendum)
If you have lab work done today you will be contacted with your lab results within the next 2 weeks.  If you have not heard from [place] then please contact us. The fastest way to get your results is to register for My Chart.   IF you received an x-ray today, you will receive an invoice from [facility]. Please contact [facility] at [phone] with questions or concerns regarding your invoice.   IF you received labwork today, you will receive an invoice from [place]. Please contact [facility] at [phone] with questions or concerns regarding your invoice.   Our billing staff will not be able to assist you with questions regarding bills from these companies.  You will be contacted with the lab results as soon as they are available. The fastest way to get your results is to activate your My Chart account. Instructions are located on the last page of this paperwork. If you have not heard from [place] regarding the results in 2 weeks, please contact this office.      Chronic Obstructive Pulmonary Disease Exacerbation Chronic obstructive pulmonary disease (COPD) is a long-term (chronic) lung problem. In COPD, the flow of air from the lungs is limited. COPD exacerbations are times that breathing gets worse and you need more than your normal treatment. Without treatment, they can be life threatening. If they happen often, your lungs can become more damaged. If your COPD gets worse, your doctor may treat you with:  Medicines.  Oxygen.  Different ways to clear your airway, such as using a mask. Follow these instructions at home: Medicines  Take over-the-counter and prescription medicines only as told by your doctor.  If you take an antibiotic or steroid medicine, do not stop taking the medicine even if you start to feel better.  Keep up with shots (vaccinations) as told by your doctor. Be sure to get a yearly (annual) flu shot. Lifestyle  Do not smoke. If you need help  quitting, ask your doctor.  Eat healthy foods.  Exercise regularly.  Get plenty of sleep.  Avoid tobacco smoke and other things that can bother your lungs.  Wash your hands often with soap and water. This will help keep you from getting an infection. If you cannot use soap and water, use hand sanitizer.  During flu season, avoid areas that are crowded with people. General instructions  Drink enough fluid to keep your pee (urine) clear or pale yellow. Do not do this if your doctor has told you not to.  Use a cool mist machine (vaporizer).  If you use oxygen or a machine that turns medicine into a mist (nebulizer), continue to use it as told.  Follow all instructions for rehabilitation. These are steps you can take to make your body work better.  Keep all follow-up visits as told by your doctor. This is important. Contact a doctor if:  Your COPD symptoms get worse than normal. Get help right away if:  You are short of breath and it gets worse.  You have trouble talking.  You have chest pain.  You cough up blood.  You have a fever.  You keep throwing up (vomiting).  You feel weak or you pass out (faint).  You feel confused.  You are not able to sleep because of your symptoms.  You are not able to do daily activities. Summary  COPD exacerbations are times that breathing gets worse and you need more treatment than normal.  COPD exacerbations can  be very serious and may cause your lungs to become more damaged.  Do not smoke. If you need help quitting, ask your doctor.  Stay up-to-date on your shots. Get a flu shot every year. This information is not intended to replace advice given to you by your health care provider. Make sure you discuss any questions you have with your health care provider. Document Released: 04/10/2011 Document Revised: 05/26/2016 Document Reviewed: 05/26/2016 Elsevier Interactive Patient Education  2019 [employer].

## 2018-04-30 LAB — TSH+T4F+T3FREE
Free T4: 1.36 ng/dL (ref 0.82–1.77)
T3, Free: 3 pg/mL (ref 2.0–4.4)
TSH: 0.191 u[IU]/mL — ABNORMAL LOW (ref 0.450–4.500)

## 2018-04-30 LAB — LDL CHOLESTEROL, DIRECT: LDL Direct: 81 mg/dL (ref 0–99)

## 2018-09-13 ENCOUNTER — Telehealth: Payer: Self-pay | Admitting: Family Medicine

## 2018-09-13 ENCOUNTER — Other Ambulatory Visit: Payer: Self-pay

## 2018-09-13 DIAGNOSIS — I10 Essential (primary) hypertension: Secondary | ICD-10-CM

## 2018-09-13 DIAGNOSIS — E785 Hyperlipidemia, unspecified: Secondary | ICD-10-CM

## 2018-09-13 DIAGNOSIS — M810 Age-related osteoporosis without current pathological fracture: Secondary | ICD-10-CM

## 2018-09-13 MED ORDER — LOSARTAN POTASSIUM 25 MG PO TABS: 25.0000 mg | ORAL_TABLET | Freq: Every day | ORAL | 1 refills | Status: DC

## 2018-09-13 MED ORDER — ALENDRONATE SODIUM 70 MG PO TABS: 70.0000 mg | ORAL_TABLET | ORAL | 3 refills | Status: DC

## 2018-09-13 MED ORDER — ATORVASTATIN CALCIUM 40 MG PO TABS: 40.0000 mg | ORAL_TABLET | Freq: Every day | ORAL | 1 refills | Status: DC

## 2018-09-13 NOTE — Telephone Encounter (Signed)
Pt is needing mutliple refills one of them is atorvastatin (LIPITOR) 40 MG tablet and  And losartan 25mg  and  1 a week alendrante sodium for bone health  70 mg pat has upcoming appt 54/19/12020 TOC FR

## 2018-09-14 ENCOUNTER — Other Ambulatory Visit: Payer: Self-pay

## 2018-09-14 DIAGNOSIS — I10 Essential (primary) hypertension: Secondary | ICD-10-CM

## 2018-09-14 DIAGNOSIS — R7989 Other specified abnormal findings of blood chemistry: Secondary | ICD-10-CM

## 2018-09-21 ENCOUNTER — Telehealth (INDEPENDENT_AMBULATORY_CARE_PROVIDER_SITE_OTHER): Payer: PPO | Admitting: Family Medicine

## 2018-09-21 ENCOUNTER — Other Ambulatory Visit: Payer: Self-pay

## 2018-09-21 DIAGNOSIS — E0590 Thyrotoxicosis, unspecified without thyrotoxic crisis or storm: Secondary | ICD-10-CM

## 2018-09-21 DIAGNOSIS — M810 Age-related osteoporosis without current pathological fracture: Secondary | ICD-10-CM

## 2018-09-21 DIAGNOSIS — E785 Hyperlipidemia, unspecified: Secondary | ICD-10-CM

## 2018-09-21 DIAGNOSIS — E119 Type 2 diabetes mellitus without complications: Secondary | ICD-10-CM

## 2018-09-21 DIAGNOSIS — I10 Essential (primary) hypertension: Secondary | ICD-10-CM | POA: Diagnosis not present

## 2018-09-21 MED ORDER — ALENDRONATE SODIUM 70 MG PO TABS: ORAL_TABLET | ORAL | 3 refills | Status: DC

## 2018-09-21 MED ORDER — LOSARTAN POTASSIUM 25 MG PO TABS: 25.0000 mg | ORAL_TABLET | Freq: Every day | ORAL | 1 refills | Status: DC

## 2018-09-21 MED ORDER — ATORVASTATIN CALCIUM 40 MG PO TABS: 40.0000 mg | ORAL_TABLET | Freq: Every day | ORAL | 1 refills | Status: DC

## 2018-09-21 NOTE — Progress Notes (Signed)
Telemedicine Encounter- SOAP NOTE Established Patient  This telephone encounter was conducted with the patient's (or proxy's) verbal consent via audio telecommunications: yes/no: Yes Patient was instructed to have this encounter in a suitably private space; and to only have persons present to whom they give permission to participate. In addition, patient identity was confirmed by use of name plus two identifiers (DOB and address).  I discussed the limitations, risks, security and privacy concerns of performing an evaluation and management service by telephone and the availability of in person appointments. I also discussed with the patient that there may be a patient responsible charge related to this service. The patient expressed understanding and agreed to proceed.  I spent a total of TIME; 0 MIN TO 60 MIN: 25 minutes talking with the patient or their proxy.  CC: diabetes and hypertension Subjective   Terry Armstrong is a 73 y.o. established patient. Telephone visit today for  HPI   Diabetes Mellitus: Patient presents for follow up of diabetes. Symptoms: none. Symptoms have stabilized. Patient denies hyperglycemia, hypoglycemia , paresthesia of the feet, polydipsia and polyuria.  Evaluation to date has been included: hemoglobin A1C.  Home sugars: patient does not check sugars. Treatment to date: more intensive attention to diet which has been effective.  Lab Results  Component Value Date   HGBA1C 6.1 (H) 09/22/2018   Hypertension: Patient here for follow-up of elevated blood pressure. She is exercising and is adherent to low salt diet.  Blood pressure is well controlled at home. Cardiac symptoms none. Patient denies chest pressure/discomfort, dyspnea, fatigue, irregular heart beat and lower extremity edema.  Cardiovascular risk factors: diabetes mellitus, dyslipidemia and hypertension. Use of agents associated with hypertension: none. History of target organ damage: none. BP Readings from  Last 3 Encounters:  04/29/18 122/65  04/13/18 128/65  07/30/17 110/72   Estrogen Deficiency She is doing well with her Fosamax She had an order for DEXA  She denies jaw pain   Patient Active Problem List   Diagnosis Date Noted  . Hx of adenomatous colonic polyps 06/19/2017  . Multinodular goiter 03/15/2017  . Centrilobular emphysema ([place]) 11/09/2016  . Periodic limb movement sleep disorder 01/11/2015  . Abnormal TSH 01/11/2015  . Upper airway resistance syndrome 01/11/2015  . Mild obstructive sleep apnea 01/11/2015  . Osteoporosis 01/10/2015  . Non-restorative sleep 05/23/2013  . Primary snoring   . HTN (hypertension) 05/11/2013  . DM (diabetes mellitus) ([place]) 06/04/2011  . Tobacco user 06/04/2011  . Hyperlipidemia LDL goal <70 06/04/2011    Past Medical History:  Diagnosis Date  . Allergy   . Breast abscess   . Constipation    if drinks alot of water no issues - uses womens stool softener PRN only   . Diabetes mellitus without complication ([place])    pt has no meds- diet controlled only   . Hx of adenomatous colonic polyps 06/19/2017  . Hyperlipidemia   . Primary snoring     Current Outpatient Medications  Medication Sig Dispense Refill  . Albuterol Sulfate (PROAIR RESPICLICK) 270 (90 Base) MCG/ACT AEPB Inhale 2 puffs into the lungs every 4 (four) hours as needed. 1 each 1  . alendronate (FOSAMAX) 70 MG tablet Take one tablet once weekly. Take with a full glass of water on an empty stomach. 12 tablet 3  . atorvastatin (LIPITOR) 40 MG tablet Take 1 tablet (40 mg total) by mouth daily. 90 tablet 1  . fluticasone (FLONASE) 50 MCG/ACT nasal spray Place 2 sprays into  both nostrils at bedtime. 16 g 2  . losartan (COZAAR) 25 MG tablet Take 1 tablet (25 mg total) by mouth daily. 90 tablet 1  . multivitamin-iron-minerals-folic acid (CENTRUM) chewable tablet Chew 1 tablet by mouth daily.    . Omega-3 Fatty Acids (FISH OIL) 1200 MG CAPS Take 1 capsule (1,200 mg total) by mouth 2  (two) times daily. 60 capsule 3  . POTASSIUM CITRATE PO Take by mouth daily.    Marland Kitchen HYDROcodone-homatropine (HYCODAN) 5-1.5 MG/5ML syrup Take 5 mLs by mouth every 8 (eight) hours as needed for cough. (Patient not taking: Reported on 09/21/2018) 120 mL 0   No current facility-administered medications for this visit.     Allergies  Allergen Reactions  . Aspirin     Swelling, scratching in throat    Social History   Socioeconomic History  . Marital status: Married    Spouse name: Not on file  . Number of children: 0  . Years of education: 80  . Highest education level: Not on file  Occupational History    Employer: RETIRED  Social Needs  . Financial resource strain: Not on file  . Food insecurity:    Worry: Not on file    Inability: Not on file  . Transportation needs:    Medical: Not on file    Non-medical: Not on file  Tobacco Use  . Smoking status: Current Every Day Smoker    Packs/day: 0.30    Years: 34.00    Pack years: 10.20    Types: Cigarettes  . Smokeless tobacco: Never Used  . Tobacco comment: Patient states that she would love to quit smoking- trying to quit- has decreased to less than a pack a day   Substance and Sexual Activity  . Alcohol use: Yes    Comment: social  . Drug use: No  . Sexual activity: Not on file  Lifestyle  . Physical activity:    Days per week: Not on file    Minutes per session: Not on file  . Stress: Not on file  Relationships  . Social connections:    Talks on phone: Not on file    Gets together: Not on file    Attends religious service: Not on file    Active member of club or organization: Not on file    Attends meetings of clubs or organizations: Not on file    Relationship status: Not on file  . Intimate partner violence:    Fear of current or ex partner: Not on file    Emotionally abused: Not on file    Physically abused: Not on file    Forced sexual activity: Not on file  Other Topics Concern  . Not on file  Social  History Narrative   Patient lives with her significant other.   Patient is retired.   Patient drinks 2-3 cups of caffeine daily in the winter.   Patient has a college education.   Patient is right-handed.          ROS Review of Systems  Constitutional: Negative for activity change, appetite change, chills and fever.  HENT: Negative for congestion, nosebleeds, trouble swallowing and voice change.   Respiratory: Negative for cough, shortness of breath and wheezing.   Gastrointestinal: Negative for diarrhea, nausea and vomiting.  Genitourinary: Negative for difficulty urinating, dysuria, flank pain and hematuria.  Musculoskeletal: Negative for back pain, joint swelling and neck pain.  Neurological: Negative for dizziness, speech difficulty, light-headedness and numbness.  See HPI.  All other review of systems negative.   Objective   No exam was performed due to this being a telephone visit  Vitals as reported by the patient: There were no vitals filed for this visit.  Diagnoses and all orders for this visit:  Type 2 diabetes mellitus without complication, without long-term current use of insulin (HCC) well controlled hemoglobin a1c is at goal Continue exercise Lipids monitored and renal function in range On arb Reviewed diabetic foot care Emphasized importance of eye and dental exam  -     Hemoglobin A1c; Future  Age related osteoporosis, unspecified pathological fracture presence- discussed osteoporosis mgmt -     alendronate (FOSAMAX) 70 MG tablet; Take one tablet once weekly. Take with a full glass of water on an empty stomach.  Essential hypertension- Patient's blood pressure is at goal of 139/89 or less. Condition is stable. Continue current medications and treatment plan. I recommend that you exercise for 30-45 minutes 5 days a week. I also recommend a balanced diet with fruits and vegetables every day, lean meats, and little fried foods. The DASH diet (you can find  this online) is a good example of this.  -     losartan (COZAAR) 25 MG tablet; Take 1 tablet (25 mg total) by mouth daily.  Hyperlipidemia LDL goal <70- Discussed medications that affect lipids Reminded patient to avoid grapefruits Reviewed last 3 lipids Discussed current meds: statin Advised dietary fiber and fish oil and ways to keep HDL high CAD prevention and reviewed side effects of statins Patient is a nonsmoker.  -     atorvastatin (LIPITOR) 40 MG tablet; Take 1 tablet (40 mg total) by mouth daily.     I discussed the assessment and treatment plan with the patient. The patient was provided an opportunity to ask questions and all were answered. The patient agreed with the plan and demonstrated an understanding of the instructions.   The patient was advised to call back or seek an in-person evaluation if the symptoms worsen or if the condition fails to improve as anticipated.  I provided 25 minutes of non-face-to-face time during this encounter.  Forrest Moron, MD  Primary Care at [facility]

## 2018-09-21 NOTE — Patient Instructions (Signed)
° ° ° °  If you have lab work done today you will be contacted with your lab results within the next 2 weeks.  If you have not heard from us then please contact us. The fastest way to get your results is to register for My Chart. ° ° °IF you received an x-ray today, you will receive an invoice from [facility]. Please contact [facility] at [phone] with questions or concerns regarding your invoice.  ° °IF you received labwork today, you will receive an invoice from [facility]. Please contact [facility] at [phone] with questions or concerns regarding your invoice.  ° °Our billing staff will not be able to assist you with questions regarding bills from these companies. ° °You will be contacted with the lab results as soon as they are available. The fastest way to get your results is to activate your My Chart account. Instructions are located on the last page of this paperwork. If you have not heard from us regarding the results in 2 weeks, please contact this office. °  ° ° ° °

## 2018-09-21 NOTE — Progress Notes (Signed)
CC: TOC (former Brigitte Pulse) and med refills.  Pt needs alendronate, atorvastatin, and losartan.  No recent weight or bp.  Pt is willing to come in the office in the morning (09/22/2018) for fasting labs.  No other concerns.  No travel outside the [place] or [place] in the past 3 weeks.

## 2018-09-22 ENCOUNTER — Ambulatory Visit: Payer: PPO

## 2018-09-22 ENCOUNTER — Other Ambulatory Visit: Payer: Self-pay

## 2018-09-22 DIAGNOSIS — R7989 Other specified abnormal findings of blood chemistry: Secondary | ICD-10-CM

## 2018-09-22 DIAGNOSIS — I10 Essential (primary) hypertension: Secondary | ICD-10-CM

## 2018-09-22 DIAGNOSIS — E119 Type 2 diabetes mellitus without complications: Secondary | ICD-10-CM

## 2018-09-23 LAB — COMPREHENSIVE METABOLIC PANEL
ALT: 18 IU/L (ref 0–32)
AST: 21 IU/L (ref 0–40)
Albumin/Globulin Ratio: 2 (ref 1.2–2.2)
Albumin: 4.6 g/dL (ref 3.7–4.7)
Alkaline Phosphatase: 66 IU/L (ref 39–117)
BUN/Creatinine Ratio: 13 (ref 12–28)
BUN: 10 mg/dL (ref 8–27)
Bilirubin Total: 0.5 mg/dL (ref 0.0–1.2)
CO2: 23 mmol/L (ref 20–29)
Calcium: 9.5 mg/dL (ref 8.7–10.3)
Chloride: 106 mmol/L (ref 96–106)
Creatinine, Ser: 0.75 mg/dL (ref 0.57–1.00)
GFR calc Af Amer: 91 mL/min/{1.73_m2} (ref >59)
GFR calc non Af Amer: 79 mL/min/{1.73_m2} (ref >59)
Globulin, Total: 2.3 g/dL (ref 1.5–4.5)
Glucose: 91 mg/dL (ref 65–99)
Potassium: 4.6 mmol/L (ref 3.5–5.2)
Sodium: 141 mmol/L (ref 134–144)
Total Protein: 6.9 g/dL (ref 6.0–8.5)

## 2018-09-23 LAB — HEMOGLOBIN A1C
Est. average glucose Bld gHb Est-mCnc: 128 mg/dL
Hgb A1c MFr Bld: 6.1 % — ABNORMAL HIGH (ref 4.8–5.6)

## 2018-09-23 LAB — TSH+T4F+T3FREE
Free T4: 1.2 ng/dL (ref 0.82–1.77)
T3, Free: 3 pg/mL (ref 2.0–4.4)
TSH: 0.43 u[IU]/mL — ABNORMAL LOW (ref 0.450–4.500)

## 2018-09-30 ENCOUNTER — Telehealth: Payer: Self-pay | Admitting: Family Medicine

## 2018-09-30 NOTE — Telephone Encounter (Signed)
Patient would like to know her results on her blood work

## 2018-09-30 NOTE — Progress Notes (Signed)
Spoke to patient

## 2018-10-04 ENCOUNTER — Telehealth: Payer: Self-pay

## 2018-10-04 NOTE — Telephone Encounter (Signed)
Spoke with pt, gave results, pt says she will f/u when she comes from the endo appt

## 2018-10-05 NOTE — Telephone Encounter (Signed)
Called patient and she said someone called her last night and went over her results with her.

## 2018-10-28 ENCOUNTER — Other Ambulatory Visit: Payer: Self-pay

## 2018-10-31 NOTE — Progress Notes (Signed)
Patient ID: Terry Armstrong, female   DOB: [birth year], 73 y.o.   MRN: [id number]    HPI  Terry Armstrong is a 73 y.o.-year-old female, referred by her PCP, Dr. Nolon Rod, for evaluation and management of subclinical thyrotoxicosis.  Patient has a history of longstanding slightly decreased TSH with normal free thyroid tests.  I reviewed pt's thyroid tests: Lab Results  Component Value Date   TSH 0.430 (L) 09/22/2018   TSH 0.191 (L) 04/29/2018   TSH 0.261 (L) 03/16/2017   TSH 0.340 (L) 11/10/2016   TSH 0.25 (L) 06/20/2016   TSH 0.25 (L) 07/26/2015   TSH 0.266 (L) 01/11/2015   TSH 0.168 (L) 07/28/2014   TSH 0.386 07/08/2013   TSH 0.306 (L) 04/08/2013   FREET4 1.20 09/22/2018   FREET4 1.36 04/29/2018   FREET4 1.00 06/20/2016   T3FREE 3.0 09/22/2018   T3FREE 3.0 04/29/2018   T3FREE 3.8 06/20/2016   Antithyroid antibodies: No results found for: TSI  Thyroid U/S (07/30/2016): 3 nodules:  2 were biopsied in 2017 with benign results in 1 unchanged from before with follow-up recommended in 1 year.  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  Pt does not a FH of thyroid ds.: maternal niece with hypothyroidism. No FH of thyroid cancer. No h/o radiation tx to head or neck.  No seaweed or kelp, no recent contrast studies. No steroid use. No herbal supplements. On Centrum.  Pt. also has a history of diabetes, well controlled, with latest HbA1c of 6.1% in 09/2018, also, HL, osteoporosis, on Fosamax.  ROS: Constitutional: + see HPI Eyes: no blurry vision, no xerophthalmia ENT: no sore throat, + see HPI Cardiovascular: no CP/SOB/palpitations/leg swelling Respiratory: no cough/SOB Gastrointestinal:  no N/V/D/+ C Musculoskeletal: no muscle/joint aches Skin: no rashes Neurological: no tremors/numbness/tingling/dizziness Psychiatric: no depression/+ anxiety  Past Medical History:  Diagnosis Date  . Allergy   . Breast abscess   . Constipation    if drinks alot of water no issues - uses womens stool softener PRN only   . Diabetes mellitus without complication ([place])    pt has no meds- diet controlled only   . Hx of adenomatous colonic polyps 06/19/2017  . Hyperlipidemia   . Primary snoring    Past Surgical History:  Procedure Laterality Date  . ABDOMINAL HYSTERECTOMY  1993  . COLONOSCOPY    . POLYPECTOMY     Social History   Socioeconomic History  . Marital status: Married    Spouse Armstrong: Not on file  . Number of children: 0  . Years of education: 2  . Highest education level: Not on file  Occupational History    Employer: RETIRED  Social Needs  . Financial resource strain: Not on file  . Food insecurity    Worry: Not on file    Inability: Not on file  . Transportation needs    Medical: Not on file    Non-medical: Not on file  Tobacco Use  . Smoking status: Current Every Day Smoker    Packs/day: 0.30    Years: 34.00    Pack years: 10.20    Types: Cigarettes  . Smokeless tobacco: Never Used  . Tobacco comment: Patient states that she  would love to quit smoking- trying to quit- has decreased to less than a pack a day   Substance and Sexual Activity  . Alcohol use: Yes    Comment: social  . Drug use: No  . Sexual activity: Not on file  Lifestyle  . Physical activity    Days per week: Not on file    Minutes per session: Not on file  . Stress: Not on file  Relationships  . Social Herbalist on phone: Not on file    Gets together: Not on file    Attends religious service: Not on file    Active member of club or organization: Not on file    Attends meetings of clubs or organizations: Not on file    Relationship status: Not on file  . Intimate  partner violence    Fear of current or ex partner: Not on file    Emotionally abused: Not on file    Physically abused: Not on file    Forced sexual activity: Not on file  Other Topics Concern  . Not on file  Social History Narrative   Patient lives with her significant other.   Patient is retired.   Patient drinks 2-3 cups of caffeine daily in the winter.   Patient has a college education.   Patient is right-handed.         Current Outpatient Medications on File Prior to Visit  Medication Sig Dispense Refill  . Albuterol Sulfate (PROAIR RESPICLICK) 329 (90 Base) MCG/ACT AEPB Inhale 2 puffs into the lungs every 4 (four) hours as needed. 1 each 1  . alendronate (FOSAMAX) 70 MG tablet Take one tablet once weekly. Take with a full glass of water on an empty stomach. 12 tablet 3  . atorvastatin (LIPITOR) 40 MG tablet Take 1 tablet (40 mg total) by mouth daily. 90 tablet 1  . fluticasone (FLONASE) 50 MCG/ACT nasal spray Place 2 sprays into both nostrils at bedtime. 16 g 2  . multivitamin-iron-minerals-folic acid (CENTRUM) chewable tablet Chew 1 tablet by mouth daily.    . Omega-3 Fatty Acids (FISH OIL) 1200 MG CAPS Take 1 capsule (1,200 mg total) by mouth 2 (two) times daily. 60 capsule 3  . POTASSIUM CITRATE PO Take by mouth daily.    Marland Kitchen losartan (COZAAR) 25 MG tablet Take 1 tablet (25 mg total) by mouth daily. (Patient not taking: Reported on 11/01/2018) 90 tablet 1   No current facility-administered medications on file prior to visit.    Allergies  Allergen Reactions  . Aspirin     Swelling, scratching in throat   Family History  Problem Relation Age of Onset  . Diabetes Mother   . Diabetes Father   . Diabetes Other   . Hypertension Sister   . Hyperlipidemia Other   . Heart disease Sister        defibrillator  . Kidney failure Maternal Aunt        dialysis  . Diabetes Maternal Aunt   . Hypertension Maternal Aunt   . Diabetes Maternal Aunt   . Heart Problems Maternal  Grandmother   . Cancer Maternal Grandmother   . Heart disease Maternal Grandmother   . Hypertension Maternal Grandmother   . Diabetes Brother   . Colon cancer Neg Hx   . Colon polyps Neg Hx   . Rectal cancer Neg Hx   . Stomach cancer Neg Hx     PE: BP 130/70   Pulse 76  Ht 5' 3.78" (1.62 m) Comment: measured without shoes  Wt 123 lb (55.8 kg)   BMI 21.26 kg/m  Wt Readings from Last 3 Encounters:  11/01/18 123 lb (55.8 kg)  04/29/18 126 lb (57.2 kg)  04/13/18 126 lb 12.8 oz (57.5 kg)   Constitutional: normal weight, in NAD Eyes: PERRLA, EOMI, no exophthalmos, no lid lag, no stare ENT: moist mucous membranes, no thyromegaly, no thyroid bruits, no cervical lymphadenopathy Cardiovascular: RRR, No MRG Respiratory: CTA B Gastrointestinal: abdomen soft, NT, ND, BS+ Musculoskeletal: no deformities, strength intact in all 4 Skin: moist, warm, no rashes Neurological: no tremor with outstretched hands, DTR normal in all 4  ASSESSMENT: 1. Thyrotoxicosis  2. Thyroid nodules  PLAN:  1. Patient with a chronically low TSH with normal free thyroid hormones, without thyrotoxic sxs: she denies weight loss, heat intolerance, hyperdefecation, palpitations.  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  She sometimes cannot sleep at night as she is very busy.  She also has anxiety related to this. - she is on multivitamins but these only contain a small amount of biotin, unlikely to have been influencing the tests over so many years. - We discussed that possible causes of thyrotoxicosis are:  Marland Kitchen Graves ds   . Thyroiditis . toxic multinodular goiter/ toxic adenoma (I do not feel any thyroid nodules on palpation, but she does have a history of thyroid nodules per review of her latest ultrasound). - will check the TSH, fT3 and fT4 and also add thyroid stimulating antibodies to screen for Graves'  disease.  - If the tests remain abnormal, we may need an uptake and scan to differentiate between the 3 above possible etiologies  - we discussed about possible modalities of treatment for the above conditions, to include methimazole use, radioactive iodine ablation or (last resort) surgery. - I do not feel that we need to add beta blockers at this time, since she is not tachycardic or tremulous - Discussed about the possibility of Graves' ophthalmopathy; she does not have any double vision, blurry vision, eye pain, chemosis. - I advised her to join my chart to communicate easier - RTC in 6 months, but likely sooner for repeat labs  2. Thyroid nodules -2 benign biopsies in 2017 -reviewed thyroid ultrasound from 2018 -Denies neck compression symptoms -We will need to repeat her thyroid ultrasound if TFTs are normal, if not, we will need a thyroid uptake and scan next  Component     Latest Ref Rng & Units 11/01/2018  TSH     0.35 - 4.50 uIU/mL 0.23 (L)  Triiodothyronine,Free,Serum     2.3 - 4.2 pg/mL 3.1  T4,Free(Direct)     0.60 - 1.60 ng/dL 0.91  TSI     <140 % baseline <89   We will go ahead with a thyroid uptake and scan.  Philemon Kingdom, MD PhD [facility]

## 2018-11-01 ENCOUNTER — Encounter: Payer: Self-pay | Admitting: Internal Medicine

## 2018-11-01 ENCOUNTER — Other Ambulatory Visit: Payer: Self-pay

## 2018-11-01 ENCOUNTER — Ambulatory Visit: Payer: PPO | Admitting: Internal Medicine

## 2018-11-01 ENCOUNTER — Ambulatory Visit (INDEPENDENT_AMBULATORY_CARE_PROVIDER_SITE_OTHER): Payer: PPO | Admitting: Internal Medicine

## 2018-11-01 VITALS — BP 130/70 | HR 76 | Ht 63.78 in | Wt 123.0 lb

## 2018-11-01 DIAGNOSIS — E0590 Thyrotoxicosis, unspecified without thyrotoxic crisis or storm: Secondary | ICD-10-CM | POA: Diagnosis not present

## 2018-11-01 LAB — TSH: TSH: 0.23 u[IU]/mL — ABNORMAL LOW (ref 0.35–4.50)

## 2018-11-01 LAB — T3, FREE: T3, Free: 3.1 pg/mL (ref 2.3–4.2)

## 2018-11-01 LAB — T4, FREE: Free T4: 0.91 ng/dL (ref 0.60–1.60)

## 2018-11-01 NOTE — Patient Instructions (Signed)
Please stop at the lab.  Please come back for a follow-up appointment in 6 months.   Hyperthyroidism  Hyperthyroidism is when the thyroid gland is too active (overactive). The thyroid gland is a small gland located in the lower front part of the neck, just in front of the windpipe (trachea). This gland makes hormones that help control how the body uses food for energy (metabolism) as well as how the heart and brain function. These hormones also play a role in keeping your bones strong. When the thyroid is overactive, it produces too much of a hormone called thyroxine. What are the causes? This condition may be caused by:  Graves' disease. This is a disorder in which the body's disease-fighting system (immune system) attacks the thyroid gland. This is the most common cause.  Inflammation of the thyroid gland.  A tumor in the thyroid gland.  Use of certain medicines, including: ? Prescription thyroid hormone replacement. ? Herbal supplements that mimic thyroid hormones. ? Amiodarone therapy.  Solid or fluid-filled lumps within your thyroid gland (thyroid nodules).  Taking in a large amount of iodine from foods or medicines. What increases the risk? You are more likely to develop this condition if:  You are female.  You have a family history of thyroid conditions.  You smoke tobacco.  You use a medicine called lithium.  You take medicines that affect the immune system (immunosuppressants). What are the signs or symptoms? Symptoms of this condition include:  Nervousness.  Inability to tolerate heat.  Unexplained weight loss.  Diarrhea.  Change in the texture of hair or skin.  Heart skipping beats or making extra beats.  Rapid heart rate.  Loss of menstruation.  Shaky hands.  Fatigue.  Restlessness.  Sleep problems.  Enlarged thyroid gland or a lump in the thyroid (nodule). You may also have symptoms of Graves' disease, which may include:  Protruding  eyes.  Dry eyes.  Red or swollen eyes.  Problems with vision. How is this diagnosed? This condition may be diagnosed based on:  Your symptoms and medical history.  A physical exam.  Blood tests.  Thyroid ultrasound. This test involves using sound waves to produce images of the thyroid gland.  A thyroid scan. A radioactive substance is injected into a vein, and images show how much iodine is present in the thyroid.  Radioactive iodine uptake test (RAIU). A small amount of radioactive iodine is given by mouth to see how much iodine the thyroid absorbs after a certain amount of time. How is this treated? Treatment depends on the cause and severity of the condition. Treatment may include:  Medicines to reduce the amount of thyroid hormone your body makes.  Radioactive iodine treatment (radioiodine therapy). This involves swallowing a small dose of radioactive iodine, in capsule or liquid form, to kill thyroid cells.  Surgery to remove part or all of your thyroid gland. You may need to take thyroid hormone replacement medicine for the rest of your life after thyroid surgery.  Medicines to help manage your symptoms. Follow these instructions at home:   Take over-the-counter and prescription medicines only as told by your health care provider.  Do not use any products that contain nicotine or tobacco, such as cigarettes and e-cigarettes. If you need help quitting, ask your health care provider.  Follow any instructions from your health care provider about diet. You may be instructed to limit foods that contain iodine.  Keep all follow-up visits as told by your health care provider. This  is important. ? You will need to have blood tests regularly so that your health care provider can monitor your condition. Contact a health care provider if:  Your symptoms do not get better with treatment.  You have a fever.  You are taking thyroid hormone replacement medicine and you: ? Have  symptoms of depression. ? Feel like you are tired all the time. ? Gain weight. Get help right away if:  You have chest pain.  You have decreased alertness or a change in your awareness.  You have abdominal pain.  You feel dizzy.  You have a rapid heartbeat.  You have an irregular heartbeat.  You have difficulty breathing. Summary  The thyroid gland is a small gland located in the lower front part of the neck, just in front of the windpipe (trachea).  Hyperthyroidism is when the thyroid gland is too active (overactive) and produces too much of a hormone called thyroxine.  The most common cause is Graves' disease, a disorder in which your immune system attacks the thyroid gland.  Hyperthyroidism can cause various symptoms, such as unexplained weight loss, nervousness, inability to tolerate heat, or changes in your heartbeat.  Treatment may include medicine to reduce the amount of thyroid hormone your body makes, radioiodine therapy, surgery, or medicines to manage symptoms. This information is not intended to replace advice given to you by your health care provider. Make sure you discuss any questions you have with your health care provider. Document Released: 04/21/2005 Document Revised: 04/03/2017 Document Reviewed: 04/01/2017 Elsevier Patient Education  2020 [employer].

## 2018-11-04 LAB — THYROID STIMULATING IMMUNOGLOBULIN: TSI: <89 % baseline (ref <140)

## 2018-11-09 ENCOUNTER — Telehealth: Payer: Self-pay

## 2018-11-09 NOTE — Telephone Encounter (Signed)
-----   Message from Philemon Kingdom, MD sent at 11/04/2018  5:31 PM EDT ----- Terry Armstrong, can you please call pt: The TSH is slightly lower than before while the free thyroid hormones are normal.  For now, I would suggest to go ahead with a thyroid uptake and scan, as discussed at the time of the visit.  I ordered this to be done at [facility] long.  Please ask her to let us know if she is not called to schedule this within a week.

## 2018-11-10 NOTE — Telephone Encounter (Signed)
Notified patient of message from Dr. Gherghe, patient expressed understanding and agreement. No further questions.  

## 2018-12-13 ENCOUNTER — Telehealth: Payer: Self-pay | Admitting: *Deleted

## 2018-12-13 NOTE — Telephone Encounter (Signed)
Scheduled AWV.

## 2019-01-07 ENCOUNTER — Telehealth: Payer: Self-pay | Admitting: *Deleted

## 2019-01-07 NOTE — Telephone Encounter (Signed)
Schedule AWV.  

## 2019-02-07 ENCOUNTER — Telehealth: Payer: Self-pay | Admitting: Family Medicine

## 2019-02-07 NOTE — Telephone Encounter (Signed)
Pt requesting refill on her inhaler. Please advise. appt on 10/12

## 2019-02-08 ENCOUNTER — Other Ambulatory Visit: Payer: Self-pay

## 2019-02-08 ENCOUNTER — Other Ambulatory Visit: Payer: Self-pay | Admitting: *Deleted

## 2019-02-08 DIAGNOSIS — F17210 Nicotine dependence, cigarettes, uncomplicated: Secondary | ICD-10-CM

## 2019-02-08 DIAGNOSIS — Z87891 Personal history of nicotine dependence: Secondary | ICD-10-CM

## 2019-02-08 DIAGNOSIS — Z122 Encounter for screening for malignant neoplasm of respiratory organs: Secondary | ICD-10-CM

## 2019-02-08 MED ORDER — PROAIR RESPICLICK 108 (90 BASE) MCG/ACT IN AEPB: 2.0000 | INHALATION_SPRAY | RESPIRATORY_TRACT | 0 refills | Status: DC | PRN

## 2019-02-08 NOTE — Telephone Encounter (Signed)
Lm on vm to return office call.  Called pt to advise inhaler sent to pharmacy. [place]

## 2019-02-08 NOTE — Progress Notes (Signed)
Approved albuterol sulfate inhaler #1 with 0 refills.  Pt has upcoming appt on 02/14/2019.  Additional refills can be given at appt.

## 2019-02-14 ENCOUNTER — Other Ambulatory Visit: Payer: Self-pay

## 2019-02-14 ENCOUNTER — Telehealth (INDEPENDENT_AMBULATORY_CARE_PROVIDER_SITE_OTHER): Payer: PPO | Admitting: Family Medicine

## 2019-02-14 DIAGNOSIS — Z23 Encounter for immunization: Secondary | ICD-10-CM

## 2019-02-14 DIAGNOSIS — E1165 Type 2 diabetes mellitus with hyperglycemia: Secondary | ICD-10-CM | POA: Diagnosis not present

## 2019-02-14 DIAGNOSIS — E785 Hyperlipidemia, unspecified: Secondary | ICD-10-CM

## 2019-02-14 DIAGNOSIS — I10 Essential (primary) hypertension: Secondary | ICD-10-CM | POA: Diagnosis not present

## 2019-02-14 MED ORDER — LOSARTAN POTASSIUM 25 MG PO TABS: 25.0000 mg | ORAL_TABLET | Freq: Every day | ORAL | 1 refills | Status: DC

## 2019-02-14 MED ORDER — ATORVASTATIN CALCIUM 40 MG PO TABS: 40.0000 mg | ORAL_TABLET | Freq: Every day | ORAL | 1 refills | Status: DC

## 2019-02-14 NOTE — Progress Notes (Signed)
Telemedicine Encounter- SOAP NOTE Established Patient  This telephone encounter was conducted with the patient's (or proxy's) verbal consent via audio telecommunications: yes/no: Yes Patient was instructed to have this encounter in a suitably private space; and to only have persons present to whom they give permission to participate. In addition, patient identity was confirmed by use of name plus two identifiers (DOB and address).  I discussed the limitations, risks, security and privacy concerns of performing an evaluation and management service by telephone and the availability of in person appointments. I also discussed with the patient that there may be a patient responsible charge related to this service. The patient expressed understanding and agreed to proceed.  I spent a total of TIME; 0 MIN TO 60 MIN: 20 minutes talking with the patient or their proxy.  CC: hypertension  Subjective   Terry Armstrong is a 73 y.o. established patient. Telephone visit today for  HPI  Hypertension: Patient here for follow-up of elevated blood pressure. She is not exercising and is adherent to low salt diet. Except today she just had [employer] before the call. Blood pressure is well controlled at home. Cardiac symptoms none. Patient denies chest pain, claudication, fatigue and lower extremity edema.  Cardiovascular risk factors: diabetes mellitus, dyslipidemia and hypertension. Use of agents associated with hypertension: none. History of target organ damage: none. BP Readings from Last 3 Encounters:  11/01/18 130/70  04/29/18 122/65  04/13/18 128/65   149/63 on  01/17/2019 174/91 when she checked it with her husband's monitor   Her diabetes is diet controlled She is on losartan for hypertension and renal protection She would like her meds refill Lab Results  Component Value Date   HGBA1C 6.1 (H) 09/22/2018    She is taking her lipitor and needs a refill She eats a healthy diet She  exercises Lab Results  Component Value Date   CHOL 148 03/16/2017   CHOL 172 08/04/2016   CHOL 243 (H) 07/26/2015   Lab Results  Component Value Date   HDL 52 03/16/2017   HDL 58 08/04/2016   HDL 65 07/26/2015   Lab Results  Component Value Date   LDLCALC 73 03/16/2017   LDLCALC 92 08/04/2016   LDLCALC 148 (H) 07/26/2015   Lab Results  Component Value Date   TRIG 113 03/16/2017   TRIG 112 08/04/2016   TRIG 150 (H) 07/26/2015   Lab Results  Component Value Date   CHOLHDL 2.8 03/16/2017   CHOLHDL 3.0 08/04/2016   CHOLHDL 3.7 07/26/2015   Lab Results  Component Value Date   LDLDIRECT 81 04/29/2018     Patient Active Problem List   Diagnosis Date Noted  . Hx of adenomatous colonic polyps 06/19/2017  . Multinodular goiter 03/15/2017  . Centrilobular emphysema ([place]) 11/09/2016  . Periodic limb movement sleep disorder 01/11/2015  . Abnormal TSH 01/11/2015  . Upper airway resistance syndrome 01/11/2015  . Mild obstructive sleep apnea 01/11/2015  . Osteoporosis 01/10/2015  . Non-restorative sleep 05/23/2013  . Primary snoring   . HTN (hypertension) 05/11/2013  . DM (diabetes mellitus) ([place]) 06/04/2011  . Tobacco user 06/04/2011  . Hyperlipidemia LDL goal <70 06/04/2011    Past Medical History:  Diagnosis Date  . Allergy   . Breast abscess   . Constipation    if drinks alot of water no issues - uses womens stool softener PRN only   . Diabetes mellitus without complication ([place])    pt has no meds- diet controlled only   .  Hx of adenomatous colonic polyps 06/19/2017  . Hyperlipidemia   . Primary snoring     Current Outpatient Medications  Medication Sig Dispense Refill  . Albuterol Sulfate (PROAIR RESPICLICK) [id number] (90 Base) MCG/ACT AEPB Inhale 2 puffs into the lungs every 4 (four) hours as needed. 1 each 0  . alendronate (FOSAMAX) 70 MG tablet Take one tablet once weekly. Take with a full glass of water on an empty stomach. 12 tablet 3  . atorvastatin  (LIPITOR) 40 MG tablet Take 1 tablet (40 mg total) by mouth daily. 90 tablet 1  . fluticasone (FLONASE) 50 MCG/ACT nasal spray Place 2 sprays into both nostrils at bedtime. 16 g 2  . losartan (COZAAR) 25 MG tablet Take 1 tablet (25 mg total) by mouth daily. 90 tablet 1  . multivitamin-iron-minerals-folic acid (CENTRUM) chewable tablet Chew 1 tablet by mouth daily.    . Omega-3 Fatty Acids (FISH OIL) 1200 MG CAPS Take 1 capsule (1,200 mg total) by mouth 2 (two) times daily. 60 capsule 3  . POTASSIUM CITRATE PO Take by mouth daily.     No current facility-administered medications for this visit.     Allergies  Allergen Reactions  . Aspirin     Swelling, scratching in throat    Social History   Socioeconomic History  . Marital status: Married    Spouse name: Not on file  . Number of children: 0  . Years of education: 43  . Highest education level: Not on file  Occupational History    Employer: RETIRED  Social Needs  . Financial resource strain: Not on file  . Food insecurity    Worry: Not on file    Inability: Not on file  . Transportation needs    Medical: Not on file    Non-medical: Not on file  Tobacco Use  . Smoking status: Current Every Day Smoker    Packs/day: 0.30    Years: 34.00    Pack years: 10.20    Types: Cigarettes  . Smokeless tobacco: Never Used  . Tobacco comment: Patient states that she would love to quit smoking- trying to quit- has decreased to less than a pack a day   Substance and Sexual Activity  . Alcohol use: Yes    Comment: social  . Drug use: No  . Sexual activity: Not on file  Lifestyle  . Physical activity    Days per week: Not on file    Minutes per session: Not on file  . Stress: Not on file  Relationships  . Social Herbalist on phone: Not on file    Gets together: Not on file    Attends religious service: Not on file    Active member of club or organization: Not on file    Attends meetings of clubs or organizations: Not  on file    Relationship status: Not on file  . Intimate partner violence    Fear of current or ex partner: Not on file    Emotionally abused: Not on file    Physically abused: Not on file    Forced sexual activity: Not on file  Other Topics Concern  . Not on file  Social History Narrative   Patient lives with her significant other.   Patient is retired.   Patient drinks 2-3 cups of caffeine daily in the winter.   Patient has a college education.   Patient is right-handed.          ROS  Review of Systems  Constitutional: Negative for activity change, appetite change, chills and fever.  HENT: Negative for congestion, nosebleeds, trouble swallowing and voice change.   Respiratory: Negative for cough, shortness of breath and wheezing.   Gastrointestinal: Negative for diarrhea, nausea and vomiting.  Genitourinary: Negative for difficulty urinating, dysuria, flank pain and hematuria.  Musculoskeletal: Negative for back pain, joint swelling and neck pain.  Neurological: Negative for dizziness, speech difficulty, light-headedness and numbness.  See HPI. All other review of systems negative.   Objective   Vitals as reported by the patient: There were no vitals filed for this visit. Normal pulmonary effort Alert and oriented  Diagnoses and all orders for this visit:  Need for immunization against influenza -     Cancel: Flu vaccine HIGH DOSE PF (Fluzone High dose)  Essential hypertension -     losartan (COZAAR) 25 MG tablet; Take 1 tablet (25 mg total) by mouth daily.  Hyperlipidemia LDL goal <70 -     atorvastatin (LIPITOR) 40 MG tablet; Take 1 tablet (40 mg total) by mouth daily. -     Comprehensive metabolic panel; Future -     Lipid panel; Future  Type 2 diabetes mellitus with hyperglycemia, without long-term current use of insulin (HCC) -     Hemoglobin A1c; Future    Refilled meds and set up pt for labs for recheck Discussed that she should also plan to get a flu  vaccination Will set up pt for nurse visit    I discussed the assessment and treatment plan with the patient. The patient was provided an opportunity to ask questions and all were answered. The patient agreed with the plan and demonstrated an understanding of the instructions.   The patient was advised to call back or seek an in-person evaluation if the symptoms worsen or if the condition fails to improve as anticipated.  I provided 20 minutes of non-face-to-face time during this encounter.  Forrest Moron, MD  Primary Care at [facility]

## 2019-02-14 NOTE — Patient Instructions (Signed)
Patient is needing medication refills on Atorvastatin and Losartan.  Patient denies any other issues at this time.  Pharmacy of choice has been verified/updated.  Patient is also requesting a flu vaccine and blood work.

## 2019-02-15 ENCOUNTER — Other Ambulatory Visit: Payer: Self-pay

## 2019-02-15 ENCOUNTER — Ambulatory Visit (INDEPENDENT_AMBULATORY_CARE_PROVIDER_SITE_OTHER): Payer: PPO | Admitting: Family Medicine

## 2019-02-15 DIAGNOSIS — Z23 Encounter for immunization: Secondary | ICD-10-CM

## 2019-02-15 DIAGNOSIS — E785 Hyperlipidemia, unspecified: Secondary | ICD-10-CM

## 2019-02-15 DIAGNOSIS — E1165 Type 2 diabetes mellitus with hyperglycemia: Secondary | ICD-10-CM | POA: Diagnosis not present

## 2019-02-15 NOTE — Progress Notes (Signed)
Pt received high dose flu shot today.

## 2019-02-15 NOTE — Progress Notes (Signed)
Nurse visit for labs and flu vaccine

## 2019-02-16 LAB — LIPID PANEL
Chol/HDL Ratio: 2.6 ratio (ref 0.0–4.4)
Cholesterol, Total: 174 mg/dL (ref 100–199)
HDL: 67 mg/dL (ref >39)
LDL Chol Calc (NIH): 92 mg/dL (ref 0–99)
Triglycerides: 82 mg/dL (ref 0–149)
VLDL Cholesterol Cal: 15 mg/dL (ref 5–40)

## 2019-02-16 LAB — COMPREHENSIVE METABOLIC PANEL
ALT: 23 IU/L (ref 0–32)
AST: 22 IU/L (ref 0–40)
Albumin/Globulin Ratio: 2.2 (ref 1.2–2.2)
Albumin: 4.4 g/dL (ref 3.7–4.7)
Alkaline Phosphatase: 69 IU/L (ref 39–117)
BUN/Creatinine Ratio: 18 (ref 12–28)
BUN: 13 mg/dL (ref 8–27)
Bilirubin Total: 0.6 mg/dL (ref 0.0–1.2)
CO2: 23 mmol/L (ref 20–29)
Calcium: 9.4 mg/dL (ref 8.7–10.3)
Chloride: 108 mmol/L — ABNORMAL HIGH (ref 96–106)
Creatinine, Ser: 0.72 mg/dL (ref 0.57–1.00)
GFR calc Af Amer: 96 mL/min/{1.73_m2} (ref >59)
GFR calc non Af Amer: 83 mL/min/{1.73_m2} (ref >59)
Globulin, Total: 2 g/dL (ref 1.5–4.5)
Glucose: 108 mg/dL — ABNORMAL HIGH (ref 65–99)
Potassium: 4.8 mmol/L (ref 3.5–5.2)
Sodium: 143 mmol/L (ref 134–144)
Total Protein: 6.4 g/dL (ref 6.0–8.5)

## 2019-02-16 LAB — HEMOGLOBIN A1C
Est. average glucose Bld gHb Est-mCnc: 131 mg/dL
Hgb A1c MFr Bld: 6.2 % — ABNORMAL HIGH (ref 4.8–5.6)

## 2019-02-17 ENCOUNTER — Other Ambulatory Visit: Payer: Self-pay | Admitting: Family Medicine

## 2019-02-17 MED ORDER — ALBUTEROL SULFATE HFA 108 (90 BASE) MCG/ACT IN AERS: 2.0000 | INHALATION_SPRAY | Freq: Four times a day (QID) | RESPIRATORY_TRACT | 1 refills | Status: DC | PRN

## 2019-03-03 ENCOUNTER — Ambulatory Visit (HOSPITAL_COMMUNITY): Payer: PPO

## 2019-03-03 ENCOUNTER — Other Ambulatory Visit (HOSPITAL_COMMUNITY): Payer: PPO

## 2019-03-04 ENCOUNTER — Other Ambulatory Visit (HOSPITAL_COMMUNITY): Payer: PPO

## 2019-03-22 ENCOUNTER — Ambulatory Visit: Payer: PPO | Admitting: Family Medicine

## 2019-03-22 DIAGNOSIS — Z1231 Encounter for screening mammogram for malignant neoplasm of breast: Secondary | ICD-10-CM | POA: Diagnosis not present

## 2019-03-22 LAB — HM MAMMOGRAPHY

## 2019-03-23 ENCOUNTER — Ambulatory Visit (INDEPENDENT_AMBULATORY_CARE_PROVIDER_SITE_OTHER)
Admission: RE | Admit: 2019-03-23 | Discharge: 2019-03-23 | Disposition: A | Discharge status: Home / Self Care | Payer: PPO | Source: Ambulatory Visit | Attending: Acute Care | Admitting: Acute Care

## 2019-03-23 ENCOUNTER — Other Ambulatory Visit: Payer: Self-pay

## 2019-03-23 DIAGNOSIS — Z87891 Personal history of nicotine dependence: Secondary | ICD-10-CM

## 2019-03-23 DIAGNOSIS — F17210 Nicotine dependence, cigarettes, uncomplicated: Secondary | ICD-10-CM | POA: Diagnosis not present

## 2019-03-23 DIAGNOSIS — Z122 Encounter for screening for malignant neoplasm of respiratory organs: Secondary | ICD-10-CM

## 2019-03-29 ENCOUNTER — Ambulatory Visit (INDEPENDENT_AMBULATORY_CARE_PROVIDER_SITE_OTHER): Payer: PPO | Admitting: Family Medicine

## 2019-03-29 ENCOUNTER — Encounter: Payer: Self-pay | Admitting: Family Medicine

## 2019-03-29 ENCOUNTER — Other Ambulatory Visit: Payer: Self-pay

## 2019-03-29 VITALS — BP 138/70 | HR 80 | Temp 98.5°F | Ht 65.0 in | Wt 121.0 lb

## 2019-03-29 DIAGNOSIS — I700 Atherosclerosis of aorta: Secondary | ICD-10-CM

## 2019-03-29 DIAGNOSIS — Z720 Tobacco use: Secondary | ICD-10-CM

## 2019-03-29 DIAGNOSIS — E1165 Type 2 diabetes mellitus with hyperglycemia: Secondary | ICD-10-CM | POA: Diagnosis not present

## 2019-03-29 DIAGNOSIS — I2510 Atherosclerotic heart disease of native coronary artery without angina pectoris: Secondary | ICD-10-CM

## 2019-03-29 DIAGNOSIS — J432 Centrilobular emphysema: Secondary | ICD-10-CM | POA: Diagnosis not present

## 2019-03-29 DIAGNOSIS — I2584 Coronary atherosclerosis due to calcified coronary lesion: Secondary | ICD-10-CM

## 2019-03-29 DIAGNOSIS — Z716 Tobacco abuse counseling: Secondary | ICD-10-CM

## 2019-03-29 MED ORDER — NICOTINE 7 MG/24HR TD PT24: 7.0000 mg | MEDICATED_PATCH | Freq: Every day | TRANSDERMAL | 0 refills | Status: DC

## 2019-03-29 MED ORDER — NICOTINE POLACRILEX 4 MG MT GUM: 4.0000 mg | CHEWING_GUM | OROMUCOSAL | 0 refills | Status: DC | PRN

## 2019-03-29 MED ORDER — VARENICLINE TARTRATE 1 MG PO TABS: 1.0000 mg | ORAL_TABLET | Freq: Two times a day (BID) | ORAL | 6 refills | Status: DC

## 2019-03-29 MED ORDER — NICOTINE 14 MG/24HR TD PT24: 14.0000 mg | MEDICATED_PATCH | Freq: Every day | TRANSDERMAL | 0 refills | Status: DC

## 2019-03-29 MED ORDER — CHANTIX STARTING MONTH PAK 0.5 MG X 11 & 1 MG X 42 PO TABS: ORAL_TABLET | ORAL | 0 refills | Status: DC

## 2019-03-29 MED ORDER — NICOTINE 21 MG/24HR TD PT24: 21.0000 mg | MEDICATED_PATCH | Freq: Every day | TRANSDERMAL | 0 refills | Status: DC

## 2019-03-29 NOTE — Patient Instructions (Addendum)
Start with the Chantix Starter Pack and Step 1 patch   Pick a day to stop using tobacco.   That day start using the 21 mg patch.  Use one of the 21-mg patches once a day for 4 weeks. On week 5, start using the 14-mg patch once a day for 4 weeks. On week 9, start using the 7-mg patch once day for 4 week.  Continue Chantix daily  Follow up with Dr. Katy Fitch for eye exam  Return in 3 months for smoking cessation.    If you have lab work done today you will be contacted with your lab results within the next 2 weeks.  If you have not heard from [place] then please contact us. The fastest way to get your results is to register for My Chart.   IF you received an x-ray today, you will receive an invoice from [facility]. Please contact [facility] at [phone] with questions or concerns regarding your invoice.   IF you received labwork today, you will receive an invoice from [facility]. Please contact [facility] at [phone] with questions or concerns regarding your invoice.   Our billing staff will not be able to assist you with questions regarding bills from these companies.  You will be contacted with the lab results as soon as they are available. The fastest way to get your results is to activate your My Chart account. Instructions are located on the last page of this paperwork. If you have not heard from [place] regarding the results in 2 weeks, please contact this office.      Steps to Quit Smoking Smoking tobacco is the leading cause of preventable death. It can affect almost every organ in the body. Smoking puts you and those around you at risk for developing many serious chronic diseases. Quitting smoking can be difficult, but it is one of the best things that you can do for your health. It is never too late to quit. How do I get ready to quit? When you decide to quit smoking, create a plan to help you succeed. Before you quit:  Pick a date to quit. Set a date within the  next 2 weeks to give you time to prepare.  Write down the reasons why you are quitting. Keep this list in places where you will see it often.  Tell your family, friends, and co-workers that you are quitting. Support from your loved ones can make quitting easier.  Talk with your health care provider about your options for quitting smoking.  Find out what treatment options are covered by your health insurance.  Identify people, places, things, and activities that make you want to smoke (triggers). Avoid them. What first steps can I take to quit smoking?  Throw away all cigarettes at home, at work, and in your car.  Throw away smoking accessories, such as Scientist, research (medical).  Clean your car. Make sure to empty the ashtray.  Clean your home, including curtains and carpets. What strategies can I use to quit smoking? Talk with your health care provider about combining strategies, such as taking medicines while you are also receiving in-person counseling. Using these two strategies together makes you more likely to succeed in quitting than if you used either strategy on its own.  If you are pregnant or breastfeeding, talk with your health care provider about finding counseling or other support strategies to quit smoking. Do not take medicine to help you quit smoking unless your health care provider tells  you to do so. To quit smoking: Quit right away  Quit smoking completely, instead of gradually reducing how much you smoke over a period of time. Research shows that stopping smoking right away is more successful than gradually quitting.  Attend in-person counseling to help you build problem-solving skills. You are more likely to succeed in quitting if you attend counseling sessions regularly. Even short sessions of 10 minutes can be effective. Take medicine You may take medicines to help you quit smoking. Some medicines require a prescription and some you can purchase over-the-counter.  Medicines may have nicotine in them to replace the nicotine in cigarettes. Medicines may:  Help to stop cravings.  Help to relieve withdrawal symptoms. Your health care provider may recommend:  Nicotine patches, gum, or lozenges.  Nicotine inhalers or sprays.  Non-nicotine medicine that is taken by mouth. Find resources Find resources and support systems that can help you to quit smoking and remain smoke-free after you quit. These resources are most helpful when you use them often. They include:  Online chats with a Social worker.  Telephone quitlines.  Printed Furniture conservator/restorer.  Support groups or group counseling.  Text messaging programs.  Mobile phone apps or applications. Use apps that can help you stick to your quit plan by providing reminders, tips, and encouragement. There are many free apps for mobile devices as well as websites. Examples include Quit Guide from the [employer] and smokefree.gov What things can I do to make it easier to quit?   Reach out to your family and friends for support and encouragement. Call telephone quitlines (1-800-QUIT-NOW), reach out to support groups, or work with a counselor for support.  Ask people who smoke to avoid smoking around you.  Avoid places that trigger you to smoke, such as bars, parties, or smoke-break areas at work.  Spend time with people who do not smoke.  Lessen the stress in your life. Stress can be a smoking trigger for some people. To lessen stress, try: ? Exercising regularly. ? Doing deep-breathing exercises. ? Doing yoga. ? Meditating. ? Performing a body scan. This involves closing your eyes, scanning your body from head to toe, and noticing which parts of your body are particularly tense. Try to relax the muscles in those areas. How will I feel when I quit smoking? Day 1 to 3 weeks Within the first 24 hours of quitting smoking, you may start to feel withdrawal symptoms. These symptoms are usually most noticeable 2-3  days after quitting, but they usually do not last for more than 2-3 weeks. You may experience these symptoms:  Mood swings.  Restlessness, anxiety, or irritability.  Trouble concentrating.  Dizziness.  Strong cravings for sugary foods and nicotine.  Mild weight gain.  Constipation.  Nausea.  Coughing or a sore throat.  Changes in how the medicines that you take for unrelated issues work in your body.  Depression.  Trouble sleeping (insomnia). Week 3 and afterward After the first 2-3 weeks of quitting, you may start to notice more positive results, such as:  Improved sense of smell and taste.  Decreased coughing and sore throat.  Slower heart rate.  Lower blood pressure.  Clearer skin.  The ability to breathe more easily.  Fewer sick days. Quitting smoking can be very challenging. Do not get discouraged if you are not successful the first time. Some people need to make many attempts to quit before they achieve long-term success. Do your best to stick to your quit plan, and talk with  your health care provider if you have any questions or concerns. Summary  Smoking tobacco is the leading cause of preventable death. Quitting smoking is one of the best things that you can do for your health.  When you decide to quit smoking, create a plan to help you succeed.  Quit smoking right away, not slowly over a period of time.  When you start quitting, seek help from your health care provider, family, or friends. This information is not intended to replace advice given to you by your health care provider. Make sure you discuss any questions you have with your health care provider. Document Released: 04/15/2001 Document Revised: 07/09/2018 Document Reviewed: 07/10/2018 Elsevier Patient Education  2020 [employer].

## 2019-03-29 NOTE — Progress Notes (Signed)
Established Patient Office Visit  Subjective:  Patient ID: Terry Armstrong, female    DOB: 1945-05-08  Age: 73 y.o. MRN: [id number]  CC:  Chief Complaint  Patient presents with  . Diabetes    f/u     HPI Terry Armstrong presents for   Diabetes Mellitus: Patient presents for follow up of diabetes. Symptoms: none. Symptoms have stabilized. Patient denies foot ulcerations, hypoglycemia , increase appetite, nausea and polydipsia.  Evaluation to date has been included: hemoglobin A1C.  Home sugars: BGs range between 120 and 130. Treatment to date: more intensive attention to diet which has been effective.   Lab Results  Component Value Date   HGBA1C 6.2 (H) 02/15/2019    Chronic Smoker/Emphesema/Aortic Atherosclerosis  She has been smoking since 1973 She had a CT lung which was BIRAD-2 with atherosclerosis and coronary calcifications.  She has been smoking a pack a year for year 46 years. She reports that she is ready to quit smoking.  The 10-year ASCVD risk score Mikey Bussing DC Brooke Bonito., et al., 2013) is: 55%   Values used to calculate the score:     Age: 60 years     Sex: Female     Is Non-Hispanic African American: Yes     Diabetic: Yes     Tobacco smoker: Yes     Systolic Blood Pressure: 0000000 mmHg     Is BP treated: Yes     HDL Cholesterol: 67 mg/dL     Total Cholesterol: 174 mg/dL  She is allergic to aspirin   Past Medical History:  Diagnosis Date  . Allergy   . Breast abscess   . Constipation    if drinks alot of water no issues - uses womens stool softener PRN only   . Diabetes mellitus without complication ([place])    pt has no meds- diet controlled only   . Hx of adenomatous colonic polyps 06/19/2017  . Hyperlipidemia   . Primary snoring     Past Surgical History:  Procedure Laterality Date  . ABDOMINAL HYSTERECTOMY  1993  . COLONOSCOPY    . POLYPECTOMY      Family History  Problem Relation Age of Onset  . Diabetes Mother   . Diabetes Father   . Diabetes Other    . Hypertension Sister   . Hyperlipidemia Other   . Heart disease Sister        defibrillator  . Kidney failure Maternal Aunt        dialysis  . Diabetes Maternal Aunt   . Hypertension Maternal Aunt   . Diabetes Maternal Aunt   . Heart Problems Maternal Grandmother   . Cancer Maternal Grandmother   . Heart disease Maternal Grandmother   . Hypertension Maternal Grandmother   . Diabetes Brother   . Colon cancer Neg Hx   . Colon polyps Neg Hx   . Rectal cancer Neg Hx   . Stomach cancer Neg Hx     Social History   Socioeconomic History  . Marital status: Married    Spouse name: Not on file  . Number of children: 0  . Years of education: 88  . Highest education level: Not on file  Occupational History    Employer: RETIRED  Social Needs  . Financial resource strain: Not on file  . Food insecurity    Worry: Not on file    Inability: Not on file  . Transportation needs    Medical: Not on file    Non-medical: Not on  file  Tobacco Use  . Smoking status: Current Every Day Smoker    Packs/day: 0.30    Years: 34.00    Pack years: 10.20    Types: Cigarettes  . Smokeless tobacco: Never Used  . Tobacco comment: Patient states that she would love to quit smoking- trying to quit- has decreased to less than a pack a day   Substance and Sexual Activity  . Alcohol use: Yes    Comment: social  . Drug use: No  . Sexual activity: Not on file  Lifestyle  . Physical activity    Days per week: Not on file    Minutes per session: Not on file  . Stress: Not on file  Relationships  . Social Herbalist on phone: Not on file    Gets together: Not on file    Attends religious service: Not on file    Active member of club or organization: Not on file    Attends meetings of clubs or organizations: Not on file    Relationship status: Not on file  . Intimate partner violence    Fear of current or ex partner: Not on file    Emotionally abused: Not on file    Physically abused:  Not on file    Forced sexual activity: Not on file  Other Topics Concern  . Not on file  Social History Narrative   Patient lives with her significant other.   Patient is retired.   Patient drinks 2-3 cups of caffeine daily in the winter.   Patient has a college education.   Patient is right-handed.          Outpatient Medications Prior to Visit  Medication Sig Dispense Refill  . albuterol (VENTOLIN HFA) 108 (90 Base) MCG/ACT inhaler Inhale 2 puffs into the lungs every 6 (six) hours as needed for wheezing or shortness of breath. 18 g 1  . atorvastatin (LIPITOR) 40 MG tablet Take 1 tablet (40 mg total) by mouth daily. 90 tablet 1  . fluticasone (FLONASE) 50 MCG/ACT nasal spray Place 2 sprays into both nostrils at bedtime. 16 g 2  . losartan (COZAAR) 25 MG tablet Take 1 tablet (25 mg total) by mouth daily. 90 tablet 1  . multivitamin-iron-minerals-folic acid (CENTRUM) chewable tablet Chew 1 tablet by mouth daily.    . Omega-3 Fatty Acids (FISH OIL) 1200 MG CAPS Take 1 capsule (1,200 mg total) by mouth 2 (two) times daily. 60 capsule 3  . POTASSIUM CITRATE PO Take by mouth daily.    Marland Kitchen alendronate (FOSAMAX) 70 MG tablet Take one tablet once weekly. Take with a full glass of water on an empty stomach. (Patient not taking: Reported on 03/29/2019) 12 tablet 3   No facility-administered medications prior to visit.     Allergies  Allergen Reactions  . Aspirin     Swelling, scratching in throat    ROS Review of Systems Review of Systems  Constitutional: Negative for activity change, appetite change, chills and fever.  HENT: Negative for congestion, nosebleeds, trouble swallowing and voice change.   Respiratory: Negative for cough, shortness of breath and wheezing.   Gastrointestinal: Negative for diarrhea, nausea and vomiting.  Genitourinary: Negative for difficulty urinating, dysuria, flank pain and hematuria.  Musculoskeletal: Negative for back pain, joint swelling and neck pain.   Neurological: Negative for dizziness, speech difficulty, light-headedness and numbness.  See HPI. All other review of systems negative.     Objective:    Physical Exam  BP 138/70   Pulse 80   Temp 98.5 F (36.9 C) (Oral)   Ht 5\' 5"  (1.651 m)   Wt 121 lb (54.9 kg)   SpO2 96%   BMI 20.14 kg/m  Wt Readings from Last 3 Encounters:  03/29/19 121 lb (54.9 kg)  11/01/18 123 lb (55.8 kg)  04/29/18 126 lb (57.2 kg)   Physical Exam  Constitutional: Oriented to person, place, and time. Appears well-developed and well-nourished.  HENT:  Head: Normocephalic and atraumatic.  Eyes: Conjunctivae and EOM are normal.  Cardiovascular: Normal rate, regular rhythm, normal heart sounds and intact distal pulses.  No murmur heard. Pulmonary/Chest: Effort normal and breath sounds normal. No stridor. No respiratory distress. Has no wheezes.  Neurological: Is alert and oriented to person, place, and time.  Skin: Skin is warm. Capillary refill takes less than 2 seconds.  Psychiatric: Has a normal mood and affect. Behavior is normal. Judgment and thought content normal.   IMPRESSION: 1. Lung-RADS 2, benign appearance or behavior. Continue annual screening with low-dose chest CT without contrast in 12 months. 2. Left thyroid nodules, previously evaluated by ultrasound and biopsy. 3. Aortic atherosclerosis (ICD10-170.0). Coronary artery calcification. 4.  Emphysema (ICD10-J43.9).   Electronically Signed   By: Lorin Picket M.D.   On: 03/23/2019 11:27    Health Maintenance Due  Topic Date Due  . OPHTHALMOLOGY EXAM  07/15/2018  . TETANUS/TDAP  10/12/2018    There are no preventive care reminders to display for this patient.  Lab Results  Component Value Date   TSH 0.23 (L) 11/01/2018   Lab Results  Component Value Date   WBC 7.8 04/29/2018   HGB 12.7 04/29/2018   HCT 37.8 04/29/2018   MCV 95.8 04/29/2018   PLT 314 03/16/2017   Lab Results  Component Value Date   NA 143  02/15/2019   K 4.8 02/15/2019   CO2 23 02/15/2019   GLUCOSE 108 (H) 02/15/2019   BUN 13 02/15/2019   CREATININE 0.72 02/15/2019   BILITOT 0.6 02/15/2019   ALKPHOS 69 02/15/2019   AST 22 02/15/2019   ALT 23 02/15/2019   PROT 6.4 02/15/2019   ALBUMIN 4.4 02/15/2019   CALCIUM 9.4 02/15/2019   Lab Results  Component Value Date   CHOL 174 02/15/2019   Lab Results  Component Value Date   HDL 67 02/15/2019   Lab Results  Component Value Date   LDLCALC 92 02/15/2019   Lab Results  Component Value Date   TRIG 82 02/15/2019   Lab Results  Component Value Date   CHOLHDL 2.6 02/15/2019   Lab Results  Component Value Date   HGBA1C 6.2 (H) 02/15/2019      Assessment & Plan:   Problem List Items Addressed This Visit      Respiratory   Centrilobular emphysema (HCC)   Relevant Medications   varenicline (CHANTIX STARTING MONTH PAK) 0.5 MG X 11 & 1 MG X 42 tablet   varenicline (CHANTIX CONTINUING MONTH PAK) 1 MG tablet   nicotine polacrilex (NICORETTE) 4 MG gum   nicotine (NICODERM CQ - DOSED IN MG/24 HOURS) 21 mg/24hr patch   nicotine (NICODERM CQ - DOSED IN MG/24 HOURS) 14 mg/24hr patch   nicotine (NICODERM CQ - DOSED IN MG/24 HR) 7 mg/24hr patch     Endocrine   DM (diabetes mellitus) ([place])     Other   Tobacco user   Relevant Medications   nicotine polacrilex (NICORETTE) 4 MG gum   nicotine (NICODERM CQ -  DOSED IN MG/24 HOURS) 21 mg/24hr patch   nicotine (NICODERM CQ - DOSED IN MG/24 HOURS) 14 mg/24hr patch   nicotine (NICODERM CQ - DOSED IN MG/24 HR) 7 mg/24hr patch    Other Visit Diagnoses    Coronary artery calcification    -  Primary   Aortic atherosclerosis (HCC)       Encounter for smoking cessation counseling       Relevant Medications   nicotine polacrilex (NICORETTE) 4 MG gum   nicotine (NICODERM CQ - DOSED IN MG/24 HOURS) 21 mg/24hr patch   nicotine (NICODERM CQ - DOSED IN MG/24 HOURS) 14 mg/24hr patch   nicotine (NICODERM CQ - DOSED IN MG/24 HR) 7  mg/24hr patch     Spent 30 minutes talking about her CT and her smoking as well as interest in quitting. Discussed her triggers and possible barriers to quitting such as first of the morning cigarette, after lunch smoking and smoking when stressed. Discussed chantix and the role of chantix in smoking cessation  Also discussed chantix side effects and risks Patient will try nicotine replacement with chantix  Diabetes is well controlled Continue diabetic diet   Meds ordered this encounter  Medications  . varenicline (CHANTIX STARTING MONTH PAK) 0.5 MG X 11 & 1 MG X 42 tablet    Sig: Take one 0.5 mg tablet by mouth once daily for 3 days, then increase to one 0.5 mg tablet twice daily for 4 days, then increase to one 1 mg tablet twice daily.    Dispense:  53 tablet    Refill:  0  . varenicline (CHANTIX CONTINUING MONTH PAK) 1 MG tablet    Sig: Take 1 tablet (1 mg total) by mouth 2 (two) times daily.    Dispense:  60 tablet    Refill:  6  . nicotine polacrilex (NICORETTE) 4 MG gum    Sig: Take 1 each (4 mg total) by mouth as needed for smoking cessation.    Dispense:  100 tablet    Refill:  0  . nicotine (NICODERM CQ - DOSED IN MG/24 HOURS) 21 mg/24hr patch    Sig: Place 1 patch (21 mg total) onto the skin daily. Step 1 for one month.    Dispense:  28 patch    Refill:  0  . nicotine (NICODERM CQ - DOSED IN MG/24 HOURS) 14 mg/24hr patch    Sig: Place 1 patch (14 mg total) onto the skin daily. Step 2 for one month    Dispense:  28 patch    Refill:  0  . nicotine (NICODERM CQ - DOSED IN MG/24 HR) 7 mg/24hr patch    Sig: Place 1 patch (7 mg total) onto the skin daily.    Dispense:  28 patch    Refill:  0    Follow-up: Return in about 3 months (around 06/29/2019) for Smoking Cessation .    Forrest Moron, MD

## 2019-04-11 ENCOUNTER — Other Ambulatory Visit: Payer: Self-pay | Admitting: *Deleted

## 2019-04-11 DIAGNOSIS — Z87891 Personal history of nicotine dependence: Secondary | ICD-10-CM

## 2019-04-11 DIAGNOSIS — Z122 Encounter for screening for malignant neoplasm of respiratory organs: Secondary | ICD-10-CM

## 2019-04-11 DIAGNOSIS — F17210 Nicotine dependence, cigarettes, uncomplicated: Secondary | ICD-10-CM

## 2019-05-03 ENCOUNTER — Ambulatory Visit: Payer: PPO | Admitting: Internal Medicine

## 2019-05-06 DIAGNOSIS — Z9289 Personal history of other medical treatment: Secondary | ICD-10-CM

## 2019-05-06 HISTORY — DX: Personal history of other medical treatment: Z92.89

## 2019-05-20 ENCOUNTER — Ambulatory Visit: Payer: PPO | Admitting: Internal Medicine

## 2019-06-16 ENCOUNTER — Ambulatory Visit: Payer: PPO | Attending: Family

## 2019-06-16 DIAGNOSIS — Z23 Encounter for immunization: Secondary | ICD-10-CM

## 2019-06-16 NOTE — Progress Notes (Signed)
   Covid-19 Vaccination Clinic  Name:  Terry Armstrong    MRN: [id number] DOB: 1945/05/31  06/16/2019  [name] was observed post Covid-19 immunization for 15 minutes without incidence. She was provided with Vaccine Information Sheet and instruction to access the V-Safe system.   [name] was instructed to call 911 with any severe reactions post vaccine: Marland Kitchen Difficulty breathing  . Swelling of your face and throat  . A fast heartbeat  . A bad rash all over your body  . Dizziness and weakness    Immunizations Administered    Name Date Dose VIS Date Route   Moderna COVID-19 Vaccine 06/16/2019  1:17 PM 0.5 mL 04/05/2019 Intramuscular   Manufacturer: Moderna   Lot: [id number]   [id number]

## 2019-06-28 ENCOUNTER — Other Ambulatory Visit: Payer: Self-pay

## 2019-06-28 ENCOUNTER — Ambulatory Visit (INDEPENDENT_AMBULATORY_CARE_PROVIDER_SITE_OTHER): Payer: PPO | Admitting: Family Medicine

## 2019-06-28 VITALS — BP 131/71 | HR 96 | Temp 97.9°F | Resp 14 | Ht 65.0 in | Wt 120.2 lb

## 2019-06-28 DIAGNOSIS — R5383 Other fatigue: Secondary | ICD-10-CM

## 2019-06-28 DIAGNOSIS — Z720 Tobacco use: Secondary | ICD-10-CM | POA: Diagnosis not present

## 2019-06-28 DIAGNOSIS — E785 Hyperlipidemia, unspecified: Secondary | ICD-10-CM | POA: Diagnosis not present

## 2019-06-28 DIAGNOSIS — Z23 Encounter for immunization: Secondary | ICD-10-CM | POA: Diagnosis not present

## 2019-06-28 DIAGNOSIS — E1165 Type 2 diabetes mellitus with hyperglycemia: Secondary | ICD-10-CM

## 2019-06-28 DIAGNOSIS — Z8249 Family history of ischemic heart disease and other diseases of the circulatory system: Secondary | ICD-10-CM | POA: Diagnosis not present

## 2019-06-28 DIAGNOSIS — E7800 Pure hypercholesterolemia, unspecified: Secondary | ICD-10-CM | POA: Diagnosis not present

## 2019-06-28 LAB — POCT CBC
Granulocyte percent: 70.4 %G (ref 37–80)
HCT, POC: 41.6 % — AB (ref 29–41)
Hemoglobin: 14.1 g/dL (ref 11–14.6)
Lymph, poc: 2.3 (ref 0.6–3.4)
MCH, POC: 32.8 pg — AB (ref 27–31.2)
MCHC: 33.9 g/dL (ref 31.8–35.4)
MCV: 96.6 fL (ref 76–111)
MID (cbc): 0.3 (ref 0–0.9)
MPV: 7.6 fL (ref 0–99.8)
POC Granulocyte: 6.1 (ref 2–6.9)
POC LYMPH PERCENT: 26.5 %L (ref 10–50)
POC MID %: 3.1 %M (ref 0–12)
Platelet Count, POC: 288 10*3/uL (ref 142–424)
RBC: 4.3 M/uL (ref 4.04–5.48)
RDW, POC: 14.4 %
WBC: 8.6 10*3/uL (ref 4.6–10.2)

## 2019-06-28 LAB — POCT GLYCOSYLATED HEMOGLOBIN (HGB A1C): Hemoglobin A1C: 6.2 % — AB (ref 4.0–5.6)

## 2019-06-28 MED ORDER — ASPIRIN EC 81 MG PO TBEC: 81.0000 mg | DELAYED_RELEASE_TABLET | Freq: Every day | ORAL | Status: AC

## 2019-06-28 NOTE — Patient Instructions (Addendum)
If you have lab work done today you will be contacted with your lab results within the next 2 weeks.  If you have not heard from [place] then please contact us. The fastest way to get your results is to register for My Chart.   IF you received an x-ray today, you will receive an invoice from [facility]. Please contact [facility] at [phone] with questions or concerns regarding your invoice.   IF you received labwork today, you will receive an invoice from [place]. Please contact [facility] at [phone] with questions or concerns regarding your invoice.   Our billing staff will not be able to assist you with questions regarding bills from these companies.  You will be contacted with the lab results as soon as they are available. The fastest way to get your results is to activate your My Chart account. Instructions are located on the last page of this paperwork. If you have not heard from [place] regarding the results in 2 weeks, please contact this office.     Coping with Quitting Smoking  Quitting smoking is a physical and mental challenge. You will face cravings, withdrawal symptoms, and temptation. Before quitting, work with your health care provider to make a plan that can help you cope. Preparation can help you quit and keep you from giving in. How can I cope with cravings? Cravings usually last for 5-10 minutes. If you get through it, the craving will pass. Consider taking the following actions to help you cope with cravings:  Keep your mouth busy: ? Chew sugar-free gum. ? Suck on hard candies or a straw. ? Brush your teeth.  Keep your hands and body busy: ? Immediately change to a different activity when you feel a craving. ? Squeeze or play with a ball. ? Do an activity or a hobby, like making bead jewelry, practicing needlepoint, or working with wood. ? Mix up your normal routine. ? Take a short exercise break. Go for a quick walk or run up and down  stairs. ? Spend time in public places where smoking is not allowed.  Focus on doing something kind or helpful for someone else.  Call a friend or family member to talk during a craving.  Join a support group.  Call a quit line, such as 1-800-QUIT-NOW.  Talk with your health care provider about medicines that might help you cope with cravings and make quitting easier for you. How can I deal with withdrawal symptoms? Your body may experience negative effects as it tries to get used to not having nicotine in the system. These effects are called withdrawal symptoms. They may include:  Feeling hungrier than normal.  Trouble concentrating.  Irritability.  Trouble sleeping.  Feeling depressed.  Restlessness and agitation.  Craving a cigarette. To manage withdrawal symptoms:  Avoid places, people, and activities that trigger your cravings.  Remember why you want to quit.  Get plenty of sleep.  Avoid coffee and other caffeinated drinks. These may worsen some of your symptoms. How can I handle social situations? Social situations can be difficult when you are quitting smoking, especially in the first few weeks. To manage this, you can:  Avoid parties, bars, and other social situations where people might be smoking.  Avoid alcohol.  Leave right away if you have the urge to smoke.  Explain to your family and friends that you are quitting smoking. Ask for understanding and support.  Plan activities with friends or family where smoking is  not an option. What are some ways I can cope with stress? Wanting to smoke may cause stress, and stress can make you want to smoke. Find ways to manage your stress. Relaxation techniques can help. For example:  Breathe slowly and deeply, in through your nose and out through your mouth.  Listen to soothing, relaxing music.  Talk with a family member or friend about your stress.  Light a candle.  Soak in a bath or take a shower.  Think  about a peaceful place. What are some ways I can prevent weight gain? Be aware that many people gain weight after they quit smoking. However, not everyone does. To keep from gaining weight, have a plan in place before you quit and stick to the plan after you quit. Your plan should include:  Having healthy snacks. When you have a craving, it may help to: ? Eat plain popcorn, crunchy carrots, celery, or other cut vegetables. ? Chew sugar-free gum.  Changing how you eat: ? Eat small portion sizes at meals. ? Eat 4-6 small meals throughout the day instead of 1-2 large meals a day. ? Be mindful when you eat. Do not watch television or do other things that might distract you as you eat.  Exercising regularly: ? Make time to exercise each day. If you do not have time for a long workout, do short bouts of exercise for 5-10 minutes several times a day. ? Do some form of strengthening exercise, like weight lifting, and some form of aerobic exercise, like running or swimming.  Drinking plenty of water or other low-calorie or no-calorie drinks. Drink 6-8 glasses of water daily, or as much as instructed by your health care provider. Summary  Quitting smoking is a physical and mental challenge. You will face cravings, withdrawal symptoms, and temptation to smoke again. Preparation can help you as you go through these challenges.  You can cope with cravings by keeping your mouth busy (such as by chewing gum), keeping your body and hands busy, and making calls to family, friends, or a helpline for people who want to quit smoking.  You can cope with withdrawal symptoms by avoiding places where people smoke, avoiding drinks with caffeine, and getting plenty of rest.  Ask your health care provider about the different ways to prevent weight gain, avoid stress, and handle social situations. This information is not intended to replace advice given to you by your health care provider. Make sure you discuss any  questions you have with your health care provider. Document Revised: 04/03/2017 Document Reviewed: 04/18/2016 Elsevier Patient Education  [employer].     If you have lab work done today you will be contacted with your lab results within the next 2 weeks.  If you have not heard from [place] then please contact us. The fastest way to get your results is to register for My Chart.   IF you received an x-ray today, you will receive an invoice from [facility]. Please contact [facility] at [phone] with questions or concerns regarding your invoice.   IF you received labwork today, you will receive an invoice from [facility]. Please contact [facility] at [phone] with questions or concerns regarding your invoice.   Our billing staff will not be able to assist you with questions regarding bills from these companies.  You will be contacted with the lab results as soon as they are available. The fastest way to get your results is to activate your My Chart account. Instructions  are located on the last page of this paperwork. If you have not heard from [place] regarding the results in 2 weeks, please contact this office.

## 2019-06-28 NOTE — Progress Notes (Signed)
Established Patient Office Visit  Subjective:  Patient ID: Terry Armstrong, female    DOB: 11/01/1945  Age: 74 y.o. MRN: [id number]  CC:  Chief Complaint  Patient presents with  . Nicotine Dependence    pt has not started regimen yet, states she has the packet but has not started, pt reports having aproximatly 10/half a pack of cigarettes per day, pt would also like a referral to cardiology due to family history, pt would also like to know if she needs another shingles shot  . Referral    cardiology. Pt experiencing fatigue as she takes care of her brother and husband an attributes some of it to this, but family hx of heart problems.  Fatigue x 3-4 months.    HPI Terry Armstrong presents for   Family history of heart disease Patient reports that her sister suffers from Congestive HEART failure She reports that  She reports that she smokes She has diabetes that is diet controlled, hypertension and smoking She consumes alcohol socially  Tobacco abuse disorder She has been smoking since age 24 yo. She is smoking less than a pack a day.  At home she has chantix and reports that it was expensive.  She states that she has all the resources and she states that her husband smokes and he is an old [place] veteran and he is not going to stop smoking.  She states that it does not make it hard for her to quit smoking because they don't smoke together.   The 10-year ASCVD risk score Terry Bussing DC Jr., et al., 2013) is: 52.1%   Values used to calculate the score:     Age: 19 years     Sex: Female     Is Non-Hispanic African American: Yes     Diabetic: Yes     Tobacco smoker: Yes     Systolic Blood Pressure: 412 mmHg     Is BP treated: Yes     HDL Cholesterol: 67 mg/dL     Total Cholesterol: 174 mg/dL   Hyperlipidemia Patient reports that she is taking her atrovastatin She is not exercising She states that she tries to eat a healthy diet She does house work No chest pains, palpitations,  shortness of breath Lab Results  Component Value Date   CHOL 174 02/15/2019   CHOL 148 03/16/2017   CHOL 172 08/04/2016   Lab Results  Component Value Date   HDL 67 02/15/2019   HDL 52 03/16/2017   HDL 58 08/04/2016   Lab Results  Component Value Date   LDLCALC 92 02/15/2019   LDLCALC 73 03/16/2017   LDLCALC 92 08/04/2016   Lab Results  Component Value Date   TRIG 82 02/15/2019   TRIG 113 03/16/2017   TRIG 112 08/04/2016   Lab Results  Component Value Date   CHOLHDL 2.6 02/15/2019   CHOLHDL 2.8 03/16/2017   CHOLHDL 3.0 08/04/2016   Lab Results  Component Value Date   LDLDIRECT 81 04/29/2018   Diabetes Mellitus: Patient presents for follow up of diabetes. Symptoms: none. Symptoms have stabilized. Patient denies hyperglycemia, hypoglycemia , increase appetite and polyuria.  Evaluation to date has been included: hemoglobin A1C.  Home sugars: patient does not check sugars. Treatment to date: more intensive attention to diet which has been effective.  Lab Results  Component Value Date   HGBA1C 6.2 (A) 06/28/2019     Past Medical History:  Diagnosis Date  . Allergy   . Breast abscess   .  Constipation    if drinks alot of water no issues - uses womens stool softener PRN only   . Diabetes mellitus without complication ([place])    pt has no meds- diet controlled only   . Hx of adenomatous colonic polyps 06/19/2017  . Hyperlipidemia   . Primary snoring     Past Surgical History:  Procedure Laterality Date  . ABDOMINAL HYSTERECTOMY  1993  . COLONOSCOPY    . POLYPECTOMY      Family History  Problem Relation Age of Onset  . Diabetes Mother   . Diabetes Father   . Diabetes Other   . Hypertension Sister   . Hyperlipidemia Other   . Heart disease Sister        defibrillator  . Kidney failure Maternal Aunt        dialysis  . Diabetes Maternal Aunt   . Hypertension Maternal Aunt   . Diabetes Maternal Aunt   . Heart Problems Maternal Grandmother   . Cancer  Maternal Grandmother   . Heart disease Maternal Grandmother   . Hypertension Maternal Grandmother   . Diabetes Brother   . Colon cancer Neg Hx   . Colon polyps Neg Hx   . Rectal cancer Neg Hx   . Stomach cancer Neg Hx     Social History   Socioeconomic History  . Marital status: Married    Spouse name: Not on file  . Number of children: 0  . Years of education: 76  . Highest education level: Not on file  Occupational History    Employer: RETIRED  Tobacco Use  . Smoking status: Current Every Day Smoker    Packs/day: 0.30    Years: 34.00    Pack years: 10.20    Types: Cigarettes  . Smokeless tobacco: Never Used  . Tobacco comment: Patient states that she would love to quit smoking- trying to quit- has decreased to less than a pack a day   Substance and Sexual Activity  . Alcohol use: Yes    Comment: social  . Drug use: No  . Sexual activity: Not on file  Other Topics Concern  . Not on file  Social History Narrative   Patient lives with her significant other.   Patient is retired.   Patient drinks 2-3 cups of caffeine daily in the winter.   Patient has a college education.   Patient is right-handed.         Social Determinants of Health   Financial Resource Strain:   . Difficulty of Paying Living Expenses: Not on file  Food Insecurity:   . Worried About Charity fundraiser in the Last Year: Not on file  . Ran Out of Food in the Last Year: Not on file  Transportation Needs:   . Lack of Transportation (Medical): Not on file  . Lack of Transportation (Non-Medical): Not on file  Physical Activity:   . Days of Exercise per Week: Not on file  . Minutes of Exercise per Session: Not on file  Stress:   . Feeling of Stress : Not on file  Social Connections:   . Frequency of Communication with Friends and Family: Not on file  . Frequency of Social Gatherings with Friends and Family: Not on file  . Attends Religious Services: Not on file  . Active Member of Clubs or  Organizations: Not on file  . Attends Archivist Meetings: Not on file  . Marital Status: Not on file  Intimate Partner Violence:   .  Fear of Current or Ex-Partner: Not on file  . Emotionally Abused: Not on file  . Physically Abused: Not on file  . Sexually Abused: Not on file    Outpatient Medications Prior to Visit  Medication Sig Dispense Refill  . albuterol (VENTOLIN HFA) 108 (90 Base) MCG/ACT inhaler Inhale 2 puffs into the lungs every 6 (six) hours as needed for wheezing or shortness of breath. 18 g 1  . alendronate (FOSAMAX) 70 MG tablet Take one tablet once weekly. Take with a full glass of water on an empty stomach. 12 tablet 3  . atorvastatin (LIPITOR) 40 MG tablet Take 1 tablet (40 mg total) by mouth daily. 90 tablet 1  . fluticasone (FLONASE) 50 MCG/ACT nasal spray Place 2 sprays into both nostrils at bedtime. 16 g 2  . losartan (COZAAR) 25 MG tablet Take 1 tablet (25 mg total) by mouth daily. 90 tablet 1  . multivitamin-iron-minerals-folic acid (CENTRUM) chewable tablet Chew 1 tablet by mouth daily.    . nicotine (NICODERM CQ - DOSED IN MG/24 HOURS) 14 mg/24hr patch Place 1 patch (14 mg total) onto the skin daily. Step 2 for one month 28 patch 0  . nicotine (NICODERM CQ - DOSED IN MG/24 HOURS) 21 mg/24hr patch Place 1 patch (21 mg total) onto the skin daily. Step 1 for one month. 28 patch 0  . nicotine (NICODERM CQ - DOSED IN MG/24 HR) 7 mg/24hr patch Place 1 patch (7 mg total) onto the skin daily. 28 patch 0  . nicotine polacrilex (NICORETTE) 4 MG gum Take 1 each (4 mg total) by mouth as needed for smoking cessation. 100 tablet 0  . Omega-3 Fatty Acids (FISH OIL) 1200 MG CAPS Take 1 capsule (1,200 mg total) by mouth 2 (two) times daily. 60 capsule 3  . POTASSIUM CITRATE PO Take by mouth daily.    . varenicline (CHANTIX CONTINUING MONTH PAK) 1 MG tablet Take 1 tablet (1 mg total) by mouth 2 (two) times daily. 60 tablet 6  . varenicline (CHANTIX STARTING MONTH PAK) 0.5  MG X 11 & 1 MG X 42 tablet Take one 0.5 mg tablet by mouth once daily for 3 days, then increase to one 0.5 mg tablet twice daily for 4 days, then increase to one 1 mg tablet twice daily. 53 tablet 0   No facility-administered medications prior to visit.    Allergies  Allergen Reactions  . Aspirin     Swelling, scratching in throat    ROS Review of Systems    See HPI. All other review of systems negative.      Objective:    Physical Exam  BP 131/71   Pulse 96   Temp 97.9 F (36.6 C) (Temporal)   Resp 14   Ht 5' 5"  (1.651 m)   Wt 120 lb 3.2 oz (54.5 kg)   SpO2 96%   BMI 20.00 kg/m  Wt Readings from Last 3 Encounters:  06/28/19 120 lb 3.2 oz (54.5 kg)  03/29/19 121 lb (54.9 kg)  11/01/18 123 lb (55.8 kg)   Physical Exam  Constitutional: Oriented to person, place, and time. Appears well-developed and well-nourished.  HENT:  Head: Normocephalic and atraumatic.  Eyes: Conjunctivae and EOM are normal.  Cardiovascular: Normal rate, regular rhythm, normal heart sounds and intact distal pulses.  No murmur heard. Pulmonary/Chest: Effort normal and breath sounds normal. No stridor. No respiratory distress. Has no wheezes.  Neurological: Is alert and oriented to person, place, and time.  Skin: Skin  is warm. Capillary refill takes less than 2 seconds.  Psychiatric: Has a normal mood and affect. Behavior is normal. Judgment and thought content normal.    Health Maintenance Due  Topic Date Due  . OPHTHALMOLOGY EXAM  07/15/2018  . TETANUS/TDAP  10/12/2018    There are no preventive care reminders to display for this patient.  Lab Results  Component Value Date   TSH 0.23 (L) 11/01/2018   Lab Results  Component Value Date   WBC 8.6 06/28/2019   HGB 14.1 06/28/2019   HCT 41.6 (A) 06/28/2019   MCV 96.6 06/28/2019   PLT 314 03/16/2017   Lab Results  Component Value Date   NA 143 02/15/2019   K 4.8 02/15/2019   CO2 23 02/15/2019   GLUCOSE 108 (H) 02/15/2019   BUN  13 02/15/2019   CREATININE 0.72 02/15/2019   BILITOT 0.6 02/15/2019   ALKPHOS 69 02/15/2019   AST 22 02/15/2019   ALT 23 02/15/2019   PROT 6.4 02/15/2019   ALBUMIN 4.4 02/15/2019   CALCIUM 9.4 02/15/2019   Lab Results  Component Value Date   CHOL 174 02/15/2019   Lab Results  Component Value Date   HDL 67 02/15/2019   Lab Results  Component Value Date   LDLCALC 92 02/15/2019   Lab Results  Component Value Date   TRIG 82 02/15/2019   Lab Results  Component Value Date   CHOLHDL 2.6 02/15/2019   Lab Results  Component Value Date   HGBA1C 6.2 (A) 06/28/2019      Assessment & Plan:   Problem List Items Addressed This Visit      Endocrine   DM (diabetes mellitus) ([place]) - Primary Diet controlled Pt on lipitor Advised aspirin 56m   Relevant Orders   HM Diabetes Foot Exam (Completed)   Ambulatory referral to Ophthalmology   CMP14+EGFR   POCT glycosylated hemoglobin (Hb A1C) (Completed)   POCT CBC (Completed)   Ambulatory referral to Cardiology     Other   Hyperlipidemia LDL goal <70   Relevant Orders   CMP14+EGFR   Lipid panel    Other Visit Diagnoses    Need for Td vaccine       Dyslipidemia  -  Improved on lipitor 466m  Relevant Orders   Lipid panel   Other fatigue    -  Patient has risk factors for heart disease Her diabetes and bp are controlled and she is not anemic   Relevant Orders   POCT CBC (Completed)   Ambulatory referral to Cardiology   Family history of heart disease    -  Will refer for screening  Maybe a cardiac CT She is currently asymptomatic  Will defer to Cardiology   Relevant Orders   Ambulatory referral to Cardiology   Tobacco abuse disorder    - patient is moving toward action step in her stages of change She has the prescription at home and plans to start using it Will continue encouraged Will do a virtual visit in 2 weeks to help coaching with smoking   Relevant Orders   Ambulatory referral to Cardiology      No  orders of the defined types were placed in this encounter.   Follow-up: No follow-ups on file.    ZoForrest MoronMD

## 2019-06-29 LAB — CMP14+EGFR
ALT: 31 IU/L (ref 0–32)
AST: 30 IU/L (ref 0–40)
Albumin/Globulin Ratio: 1.9 (ref 1.2–2.2)
Albumin: 4.3 g/dL (ref 3.7–4.7)
Alkaline Phosphatase: 76 IU/L (ref 39–117)
BUN/Creatinine Ratio: 17 (ref 12–28)
BUN: 14 mg/dL (ref 8–27)
Bilirubin Total: 0.5 mg/dL (ref 0.0–1.2)
CO2: 20 mmol/L (ref 20–29)
Calcium: 9.4 mg/dL (ref 8.7–10.3)
Chloride: 107 mmol/L — ABNORMAL HIGH (ref 96–106)
Creatinine, Ser: 0.84 mg/dL (ref 0.57–1.00)
GFR calc Af Amer: 79 mL/min/{1.73_m2} (ref >59)
GFR calc non Af Amer: 69 mL/min/{1.73_m2} (ref >59)
Globulin, Total: 2.3 g/dL (ref 1.5–4.5)
Glucose: 109 mg/dL — ABNORMAL HIGH (ref 65–99)
Potassium: 4.8 mmol/L (ref 3.5–5.2)
Sodium: 144 mmol/L (ref 134–144)
Total Protein: 6.6 g/dL (ref 6.0–8.5)

## 2019-06-29 LAB — LIPID PANEL
Chol/HDL Ratio: 2.3 ratio (ref 0.0–4.4)
Cholesterol, Total: 164 mg/dL (ref 100–199)
HDL: 72 mg/dL (ref >39)
LDL Chol Calc (NIH): 81 mg/dL (ref 0–99)
Triglycerides: 55 mg/dL (ref 0–149)
VLDL Cholesterol Cal: 11 mg/dL (ref 5–40)

## 2019-07-01 ENCOUNTER — Other Ambulatory Visit: Payer: Self-pay

## 2019-07-01 ENCOUNTER — Ambulatory Visit (INDEPENDENT_AMBULATORY_CARE_PROVIDER_SITE_OTHER): Payer: PPO | Admitting: Internal Medicine

## 2019-07-01 ENCOUNTER — Encounter: Payer: Self-pay | Admitting: Internal Medicine

## 2019-07-01 VITALS — BP 112/60 | HR 80 | Ht 64.17 in | Wt 119.0 lb

## 2019-07-01 DIAGNOSIS — E0590 Thyrotoxicosis, unspecified without thyrotoxic crisis or storm: Secondary | ICD-10-CM

## 2019-07-01 DIAGNOSIS — E041 Nontoxic single thyroid nodule: Secondary | ICD-10-CM

## 2019-07-01 DIAGNOSIS — E1165 Type 2 diabetes mellitus with hyperglycemia: Secondary | ICD-10-CM | POA: Diagnosis not present

## 2019-07-01 LAB — T3, FREE: T3, Free: 3.1 pg/mL (ref 2.3–4.2)

## 2019-07-01 LAB — TSH: TSH: 0.2 u[IU]/mL — ABNORMAL LOW (ref 0.35–4.50)

## 2019-07-01 LAB — T4, FREE: Free T4: 1 ng/dL (ref 0.60–1.60)

## 2019-07-01 NOTE — Progress Notes (Addendum)
Patient ID: Terry Armstrong, female   DOB: 10/15/1945, 74 y.o.   MRN: [id number]   This visit occurred during the SARS-CoV-2 public health emergency.  Safety protocols were in place, including screening questions prior to the visit, additional usage of staff PPE, and extensive cleaning of exam room while observing appropriate contact time as indicated for disinfecting solutions.   HPI  Terry Armstrong is a 74 y.o.-year-old female, initially referred by her PCP, Dr. Nolon Rod, returning for follow-up subclinical thyrotoxicosis.  Last visit 8 months ago.  Patient has a long history of slightly decreased TSH with normal free T4 and free T3.  Reviewed patient's TFTs: Lab Results  Component Value Date   TSH 0.23 (L) 11/01/2018   TSH 0.430 (L) 09/22/2018   TSH 0.191 (L) 04/29/2018   TSH 0.261 (L) 03/16/2017   TSH 0.340 (L) 11/10/2016   TSH 0.25 (L) 06/20/2016   TSH 0.25 (L) 07/26/2015   TSH 0.266 (L) 01/11/2015   TSH 0.168 (L) 07/28/2014   TSH 0.386 07/08/2013   FREET4 0.91 11/01/2018   FREET4 1.20 09/22/2018   FREET4 1.36 04/29/2018   FREET4 1.00 06/20/2016   T3FREE 3.1 11/01/2018   T3FREE 3.0 09/22/2018   T3FREE 3.0 04/29/2018   T3FREE 3.8 06/20/2016   Her Graves' antibodies were not elevated: Lab Results  Component Value Date   TSI <89 11/01/2018   Thyroid U/S (07/31/2016): several nodules, of which 2 were biopsied in 2017 with benign results in 1 unchanged from before with follow-up recommended in 1 year.  Nodule # 1: Prior biopsy: No Location: Right; Superior Maximum size: 1.1 cm; Other 2 dimensions: 0.6 x 0.6 cm, previously, 1.0 cm Composition: solid/almost completely solid (2) Echogenicity: hypoechoic (2) *Given size (>/= 1 - 1.4 cm) and appearance, a follow-up ultrasound in 1 year should be considered based on TI-RADS criteria. This nodule is not significantly changed. _________________________________________________________  Dominant right mid nodule measures up to  2.3 cm and previously measured 2.1 cm. This was biopsied 08/28/2015.  Dominant left lower pole nodule measures 3.5 cm and previously measured up to 3.3 cm. It was biopsied on 08/28/2015.  Right-sided smaller nodules measure up to 0.8 cm that are not significantly changed or smaller than on the prior study. They have a benign appearance.  IMPRESSION: Dominant nodules were previously biopsied and are not significantly changed.  1.1 cm right upper pole nodule is not significantly changed as described above.  Pt denies: - feeling nodules in neck - hoarseness - dysphagia - choking - SOB with lying down  She denies - fatigue - excessive sweating/heat intolerance - tremors - palpitations - hyperdefecation - weight loss - hair loss  But she does have: - anxiety (situational)  Her insomnia is better.  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 She is very busy.  Pt does not a FH of thyroid ds.: maternal niece with hypothyroidism. No FH of thyroid cancer. No h/o radiation tx to head or neck.  No seaweed or kelp. No recent contrast studies. No herbal supplements. No Biotin use. No recent steroids use.   She is on multivitamins  Pt. also has a history of diabetes, well controlled, with latest HbA1c of 6.1% in 09/2018, also HL, osteoporosis, on Fosamax.  ROS: Constitutional: no weight gain/no weight loss, no fatigue, no subjective hyperthermia, no subjective hypothermia Eyes: no blurry vision, no xerophthalmia ENT: no sore throat, + see HPI Cardiovascular: no CP/no SOB/no palpitations/no leg swelling Respiratory: no cough/no SOB/no wheezing  Gastrointestinal: no N/no V/no D/no C/no acid reflux Musculoskeletal: no muscle aches/no joint aches Skin: no rashes, no hair loss Neurological: no tremors/no numbness/no tingling/no dizziness  I reviewed pt's medications, allergies, PMH, social hx, family hx, and changes were documented in the history of present  illness. Otherwise, unchanged from my initial visit note.  Past Medical History:  Diagnosis Date  . Allergy   . Breast abscess   . Constipation    if drinks alot of water no issues - uses womens stool softener PRN only   . Diabetes mellitus without complication ([place])    pt has no meds- diet controlled only   . Hx of adenomatous colonic polyps 06/19/2017  . Hyperlipidemia   . Primary snoring    Past Surgical History:  Procedure Laterality Date  . ABDOMINAL HYSTERECTOMY  1993  . COLONOSCOPY    . POLYPECTOMY     Social History   Socioeconomic History  . Marital status: Married    Spouse name: Not on file  . Number of children: 0  . Years of education: 10  . Highest education level: Not on file  Occupational History    Employer: RETIRED  Tobacco Use  . Smoking status: Current Every Day Smoker    Packs/day: 0.30    Years: 34.00    Pack years: 10.20    Types: Cigarettes  . Smokeless tobacco: Never Used  . Tobacco comment: Patient states that she would love to quit smoking- trying to quit- has decreased to less than a pack a day   Substance and Sexual Activity  . Alcohol use: Yes    Comment: social  . Drug use: No  . Sexual activity: Not on file  Other Topics Concern  . Not on file  Social History Narrative   Patient lives with her significant other.   Patient is retired.   Patient drinks 2-3 cups of caffeine daily in the winter.   Patient has a college education.   Patient is right-handed.         Social Determinants of Health   Financial Resource Strain:   . Difficulty of Paying Living Expenses: Not on file  Food Insecurity:   . Worried About Charity fundraiser in the Last Year: Not on file  . Ran Out of Food in the Last Year: Not on file  Transportation Needs:   . Lack of Transportation (Medical): Not on file  . Lack of Transportation (Non-Medical): Not on file  Physical Activity:   . Days of Exercise per Week: Not on file  . Minutes of Exercise per  Session: Not on file  Stress:   . Feeling of Stress : Not on file  Social Connections:   . Frequency of Communication with Friends and Family: Not on file  . Frequency of Social Gatherings with Friends and Family: Not on file  . Attends Religious Services: Not on file  . Active Member of Clubs or Organizations: Not on file  . Attends Archivist Meetings: Not on file  . Marital Status: Not on file  Intimate Partner Violence:   . Fear of Current or Ex-Partner: Not on file  . Emotionally Abused: Not on file  . Physically Abused: Not on file  . Sexually Abused: Not on file   Current Outpatient Medications on File Prior to Visit  Medication Sig Dispense Refill  . albuterol (VENTOLIN HFA) 108 (90 Base) MCG/ACT inhaler Inhale 2 puffs into the lungs every 6 (six) hours as needed for  wheezing or shortness of breath. 18 g 1  . alendronate (FOSAMAX) 70 MG tablet Take one tablet once weekly. Take with a full glass of water on an empty stomach. 12 tablet 3  . aspirin EC 81 MG tablet Take 1 tablet (81 mg total) by mouth daily.    Marland Kitchen atorvastatin (LIPITOR) 40 MG tablet Take 1 tablet (40 mg total) by mouth daily. 90 tablet 1  . fluticasone (FLONASE) 50 MCG/ACT nasal spray Place 2 sprays into both nostrils at bedtime. 16 g 2  . losartan (COZAAR) 25 MG tablet Take 1 tablet (25 mg total) by mouth daily. 90 tablet 1  . multivitamin-iron-minerals-folic acid (CENTRUM) chewable tablet Chew 1 tablet by mouth daily.    . nicotine (NICODERM CQ - DOSED IN MG/24 HOURS) 14 mg/24hr patch Place 1 patch (14 mg total) onto the skin daily. Step 2 for one month 28 patch 0  . nicotine (NICODERM CQ - DOSED IN MG/24 HOURS) 21 mg/24hr patch Place 1 patch (21 mg total) onto the skin daily. Step 1 for one month. 28 patch 0  . nicotine (NICODERM CQ - DOSED IN MG/24 HR) 7 mg/24hr patch Place 1 patch (7 mg total) onto the skin daily. 28 patch 0  . nicotine polacrilex (NICORETTE) 4 MG gum Take 1 each (4 mg total) by mouth  as needed for smoking cessation. 100 tablet 0  . Omega-3 Fatty Acids (FISH OIL) 1200 MG CAPS Take 1 capsule (1,200 mg total) by mouth 2 (two) times daily. 60 capsule 3  . POTASSIUM CITRATE PO Take by mouth daily.    . varenicline (CHANTIX CONTINUING MONTH PAK) 1 MG tablet Take 1 tablet (1 mg total) by mouth 2 (two) times daily. 60 tablet 6  . varenicline (CHANTIX STARTING MONTH PAK) 0.5 MG X 11 & 1 MG X 42 tablet Take one 0.5 mg tablet by mouth once daily for 3 days, then increase to one 0.5 mg tablet twice daily for 4 days, then increase to one 1 mg tablet twice daily. 53 tablet 0   No current facility-administered medications on file prior to visit.   No Active Allergies Family History  Problem Relation Age of Onset  . Diabetes Mother   . Diabetes Father   . Diabetes Other   . Hypertension Sister   . Hyperlipidemia Other   . Heart disease Sister        defibrillator  . Kidney failure Maternal Aunt        dialysis  . Diabetes Maternal Aunt   . Hypertension Maternal Aunt   . Diabetes Maternal Aunt   . Heart Problems Maternal Grandmother   . Cancer Maternal Grandmother   . Heart disease Maternal Grandmother   . Hypertension Maternal Grandmother   . Diabetes Brother   . Colon cancer Neg Hx   . Colon polyps Neg Hx   . Rectal cancer Neg Hx   . Stomach cancer Neg Hx     PE: BP 112/60   Pulse 80   Ht 5' 4.17" (1.63 m) Comment: measured today without shoes  Wt 119 lb (54 kg)   SpO2 98%   BMI 20.32 kg/m  Wt Readings from Last 3 Encounters:  07/01/19 119 lb (54 kg)  06/28/19 120 lb 3.2 oz (54.5 kg)  03/29/19 121 lb (54.9 kg)   Constitutional: normal weight, in NAD Eyes: PERRLA, EOMI, no exophthalmos ENT: moist mucous membranes, no thyromegaly but left thyroid nodule palpable, no cervical lymphadenopathy Cardiovascular: RRR, No MRG Respiratory: CTA  B Gastrointestinal: abdomen soft, NT, ND, BS+ Musculoskeletal: no deformities, strength intact in all 4 Skin: moist, warm, no  rashes Neurological: no tremor with outstretched hands, DTR normal in all 4  ASSESSMENT: 1. Thyrotoxicosis  2. Thyroid nodules  3.  Prediabetes/controlled diabetes  PLAN:  1. Patient with low TSH with normal thyroid hormones, without thyrotoxic symptoms: She denies weight loss, heat intolerance, hyper defecation, palpitations.  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  Both have cancer.  She has anxiety and had insomnia but this improved. -We again discussed the possible causes of thyrotoxicosis:  . Graves' disease . Thyroiditis . toxic multinodular goiter/ toxic adenoma (I do not feel a thyroid nodule on palpation but she does have a history of thyroid nodules per review of her ultrasound) -Her TFTs were still abnormal at last visit and her TSI's were not elevated, so we did not have a definite diagnosis of Graves' disease. I ordered a thyroid uptake and scan at last visit.  She did not have it yet as she was not called about it... -We again discussed about possible modalities of treatment for the above conditions including methimazole, RAI treatment or, last resort, thyroidectomy -No treatment needed for now since she is asymptomatic and she only has mild subclinical thyrotoxicosis. -We do not need beta-blockers for now since she is not tachycardic or tremulous -No signs of Graves' ophthalmopathy: She denies double vision, blurry vision, eye pain, chemosis -We will recheck her TFTs today and may need to order again a thyroid uptake and scan after results are back -I will see her back in 6 months  2. Thyroid nodules - she has several nodules of which 2 met criteria for biopsy, which she had in 2017.  Both biopsies were benign. -We had another thyroid ultrasound from 2018: Stable nodules -No neck compression symptoms -We will need a new thyroid ultrasound, after the uptake  and scan.  3.  Prediabetes/controlled diabetes -Diet controlled -Last HbA1c was 6.1% last year -Another HbA1c was checked today (by mistake) and it was still 6.1%  Office Visit on 07/01/2019  Component Date Value Ref Range Status  . TSH 07/01/2019 0.20* 0.35 - 4.50 uIU/mL Final  . Free T4 07/01/2019 1.00  0.60 - 1.60 ng/dL Final   Comment: Specimens from patients who are undergoing biotin therapy and /or ingesting biotin supplements may contain high levels of biotin.  The higher biotin concentration in these specimens interferes with this Free T4 assay.  Specimens that contain high levels  of biotin may cause false high results for this Free T4 assay.  Please interpret results in light of the total clinical presentation of the patient.    . T3, Free 07/01/2019 3.1  2.3 - 4.2 pg/mL Final   TSH is still slightly suppressed but free T4 and free T3 are normal.  We will go ahead with a thyroid uptake and scan, as discussed.  Thyroid uptake and scan (07/14/2019): Narrative & Impression    CLINICAL DATA:  Hyperthyroidism with reported symptoms anxious, irritability, hair loss and heart palpitations. TSH equal 0.2. Thyroid nodules on ultrasound.  EXAM: THYROID SCAN AND UPTAKE - 4 AND 24 HOURS  TECHNIQUE: Following oral administration of I-123 capsule, anterior planar imaging was acquired at 24 hours. Thyroid uptake was calculated with a thyroid probe at 4-6 hours and 24 hours.  RADIOPHARMACEUTICALS:  Four hundred  ninety-four uCi I-123 sodium iodide p.o.  COMPARISON:  None.  FINDINGS: Enlarged nodular thyroid gland.  4 hour I-123 uptake = 11% (normal 5-20%)  24 hour I-123 uptake = 23% (normal 10-30%)  IMPRESSION: Findings consistent with multinodular goiter. Thyroid uptake normal.  Consider toxic multinodular goiter given mildly suppressed TSH and reported hyperthyroid symptoms.   Electronically Signed   By: Suzy Bouchard M.D.   On: 07/14/2019 15:05    Mild  toxic multinodular goiter with normal uptake.  For now, we can continue to follow her without treatment due to lack of symptoms.  I would like to repeat her TFTs in 3 months, though.  Philemon Kingdom, MD PhD [facility]

## 2019-07-01 NOTE — Patient Instructions (Signed)
Please stop at the lab.  Please come back for a follow-up appointment in 6 months.  

## 2019-07-04 ENCOUNTER — Telehealth: Payer: Self-pay | Admitting: Family Medicine

## 2019-07-04 ENCOUNTER — Telehealth: Payer: Self-pay

## 2019-07-04 ENCOUNTER — Encounter: Payer: Self-pay | Admitting: Radiology

## 2019-07-04 NOTE — Telephone Encounter (Signed)
Notified patient of message from Dr. Cruzita Lederer, patient expressed understanding and agreement. No further questions.  Phone number provided.

## 2019-07-04 NOTE — Telephone Encounter (Signed)
Please have provider sign ovc note so the patient's Cardiology referral can be sent.

## 2019-07-04 NOTE — Telephone Encounter (Signed)
-----   Message from Philemon Kingdom, MD sent at 07/01/2019  4:59 PM EST ----- Lenna Sciara, can you please call pt: Terry Armstrong TSH is still slightly low, similar to before, all the free thyroid hormones are normal.  Let us go ahead with a thyroid uptake and scan, as discussed.  I will order this, but please asked the patient to call Elv[facility] Sidle to schedule it, since I also ordered it in July last year and nobody called Terry Armstrong about it.  [phone].

## 2019-07-05 ENCOUNTER — Other Ambulatory Visit: Payer: Self-pay

## 2019-07-07 ENCOUNTER — Other Ambulatory Visit: Payer: Self-pay | Admitting: *Deleted

## 2019-07-07 ENCOUNTER — Other Ambulatory Visit: Payer: Self-pay

## 2019-07-07 ENCOUNTER — Ambulatory Visit (INDEPENDENT_AMBULATORY_CARE_PROVIDER_SITE_OTHER): Payer: PPO | Admitting: Family Medicine

## 2019-07-07 VITALS — BP 112/60 | Ht 64.17 in | Wt 119.0 lb

## 2019-07-07 DIAGNOSIS — Z0000 Encounter for general adult medical examination without abnormal findings: Secondary | ICD-10-CM | POA: Diagnosis not present

## 2019-07-07 MED ORDER — FLUTICASONE PROPIONATE 50 MCG/ACT NA SUSP: 2.0000 | Freq: Every day | NASAL | 2 refills | Status: DC

## 2019-07-07 NOTE — Progress Notes (Signed)
Presents today for [employer] Visit   Date of last exam:  06/28/2019  Interpreter used for this visit no  I connected with  Terry Armstrong on 07/07/19 by telephone  and verified that I am speaking with the correct person using two identifiers.   I discussed the limitations of evaluation and management by telemedicine. The patient expressed understanding and agreed to proceed.    Patient Care Team: Forrest Moron, MD as PCP - General (Internal Medicine) Shawnee Knapp, MD as PCP - Family Medicine (Family Medicine) Clent Jacks, MD as Consulting Physician (Ophthalmology)   Other items to address today:   Discussed immunizations Discussed Eye/Dental Follow up scheduled 39 @ 10;40 telemed     Other Screening: Last screening for diabetes: 02/15/2019 Last lipid screening: 06/28/2019  ADVANCE DIRECTIVES: Discussed:yes On File: no Materials Provided: yes  Immunization status:  Immunization History  Administered Date(s) Administered  . Fluad Quad(high Dose 65+) 02/15/2019  . Influenza Split 01/28/2012  . Influenza, High Dose Seasonal PF 06/20/2016, 02/10/2018  . Influenza,inj,Quad PF,6+ Mos 04/08/2013, 04/15/2014, 01/11/2015, 03/03/2017  . Moderna SARS-COVID-2 Vaccination 06/16/2019  . Pneumococcal Conjugate-13 07/26/2015  . Pneumococcal Polysaccharide-23 11/14/2010  . Td 06/28/2019  . Tdap 10/11/2008  . Zoster 05/05/2014     Health Maintenance Due  Topic Date Due  . OPHTHALMOLOGY EXAM  07/15/2018     Functional Status Survey: Is the patient deaf or have difficulty hearing?: No Does the patient have difficulty seeing, even when wearing glasses/contacts?: No Does the patient have difficulty concentrating, remembering, or making decisions?: No Does the patient have difficulty walking or climbing stairs?: No Does the patient have difficulty dressing or bathing?: No Does the patient have difficulty doing errands alone such as visiting a doctor's  office or shopping?: No   6CIT Screen 07/07/2019 03/03/2017  What Year? 0 points 0 points  What month? 0 points 0 points  What time? 0 points 0 points  Count back from 20 0 points 0 points  Months in reverse 0 points 0 points  Repeat phrase 0 points 2 points  Total Score 0 2        Clinical Support from 07/07/2019 in [facility]  AUDIT-C Score  5       Home Environment:   Lives in a three level home Yes scatter rugs Yes grab bars Adequate lighting/no clutter No trouble climbing stairs.  Timed warm up N/A   Patient Active Problem List   Diagnosis Date Noted  . Hx of adenomatous colonic polyps 06/19/2017  . Multinodular goiter 03/15/2017  . Centrilobular emphysema ([place]) 11/09/2016  . Periodic limb movement sleep disorder 01/11/2015  . Abnormal TSH 01/11/2015  . Upper airway resistance syndrome 01/11/2015  . Mild obstructive sleep apnea 01/11/2015  . Osteoporosis 01/10/2015  . Non-restorative sleep 05/23/2013  . Primary snoring   . HTN (hypertension) 05/11/2013  . DM (diabetes mellitus) ([place]) 06/04/2011  . Tobacco user 06/04/2011  . Hyperlipidemia LDL goal <70 06/04/2011     Past Medical History:  Diagnosis Date  . Allergy   . Breast abscess   . Constipation    if drinks alot of water no issues - uses womens stool softener PRN only   . Diabetes mellitus without complication ([place])    pt has no meds- diet controlled only   . Hx of adenomatous colonic polyps 06/19/2017  . Hyperlipidemia   . Primary snoring      Past Surgical History:  Procedure Laterality  Date  . ABDOMINAL HYSTERECTOMY  1993  . COLONOSCOPY    . POLYPECTOMY       Family History  Problem Relation Age of Onset  . Diabetes Mother   . Diabetes Father   . Diabetes Other   . Hypertension Sister   . Hyperlipidemia Other   . Heart disease Sister        defibrillator  . Kidney failure Maternal Aunt        dialysis  . Diabetes Maternal Aunt   . Hypertension Maternal Aunt   .  Diabetes Maternal Aunt   . Heart Problems Maternal Grandmother   . Cancer Maternal Grandmother   . Heart disease Maternal Grandmother   . Hypertension Maternal Grandmother   . Diabetes Brother   . Colon cancer Neg Hx   . Colon polyps Neg Hx   . Rectal cancer Neg Hx   . Stomach cancer Neg Hx      Social History   Socioeconomic History  . Marital status: Married    Spouse name: Not on file  . Number of children: 0  . Years of education: 28  . Highest education level: Not on file  Occupational History    Employer: RETIRED  Tobacco Use  . Smoking status: Current Every Day Smoker    Packs/day: 0.30    Years: 34.00    Pack years: 10.20    Types: Cigarettes  . Smokeless tobacco: Never Used  . Tobacco comment: Patient states that she would love to quit smoking- trying to quit- has decreased to less than a pack a day   Substance and Sexual Activity  . Alcohol use: Yes    Comment: social  . Drug use: No  . Sexual activity: Not on file  Other Topics Concern  . Not on file  Social History Narrative   Patient lives with her significant other.   Patient is retired.   Patient drinks 2-3 cups of caffeine daily in the winter.   Patient has a college education.   Patient is right-handed.         Social Determinants of Health   Financial Resource Strain:   . Difficulty of Paying Living Expenses: Not on file  Food Insecurity:   . Worried About Charity fundraiser in the Last Year: Not on file  . Ran Out of Food in the Last Year: Not on file  Transportation Needs:   . Lack of Transportation (Medical): Not on file  . Lack of Transportation (Non-Medical): Not on file  Physical Activity:   . Days of Exercise per Week: Not on file  . Minutes of Exercise per Session: Not on file  Stress:   . Feeling of Stress : Not on file  Social Connections:   . Frequency of Communication with Friends and Family: Not on file  . Frequency of Social Gatherings with Friends and Family: Not on  file  . Attends Religious Services: Not on file  . Active Member of Clubs or Organizations: Not on file  . Attends Archivist Meetings: Not on file  . Marital Status: Not on file  Intimate Partner Violence:   . Fear of Current or Ex-Partner: Not on file  . Emotionally Abused: Not on file  . Physically Abused: Not on file  . Sexually Abused: Not on file     No Active Allergies   Prior to Admission medications   Medication Sig Start Date End Date Taking? Authorizing Provider  albuterol (VENTOLIN HFA) 108 (  90 Base) MCG/ACT inhaler Inhale 2 puffs into the lungs every 6 (six) hours as needed for wheezing or shortness of breath. 02/17/19  Yes Stallings, Zoe A, MD  alendronate (FOSAMAX) 70 MG tablet Take one tablet once weekly. Take with a full glass of water on an empty stomach. 09/21/18  Yes Forrest Moron, MD  aspirin EC 81 MG tablet Take 1 tablet (81 mg total) by mouth daily. 06/28/19  Yes Delia Chimes A, MD  atorvastatin (LIPITOR) 40 MG tablet Take 1 tablet (40 mg total) by mouth daily. 02/14/19  Yes Stallings, Zoe A, MD  fluticasone (FLONASE) 50 MCG/ACT nasal spray Place 2 sprays into both nostrils at bedtime. 07/30/17  Yes Shawnee Knapp, MD  losartan (COZAAR) 25 MG tablet Take 1 tablet (25 mg total) by mouth daily. 02/14/19  Yes Forrest Moron, MD  multivitamin-iron-minerals-folic acid (CENTRUM) chewable tablet Chew 1 tablet by mouth daily.   Yes [provider]  Omega-3 Fatty Acids (FISH OIL) 1200 MG CAPS Take 1 capsule (1,200 mg total) by mouth 2 (two) times daily. 04/04/16  Yes Nche, Charlene Brooke, NP  nicotine (NICODERM CQ - DOSED IN MG/24 HOURS) 14 mg/24hr patch Place 1 patch (14 mg total) onto the skin daily. Step 2 for one month Patient not taking: Reported on 07/07/2019 03/29/19   Forrest Moron, MD  nicotine (NICODERM CQ - DOSED IN MG/24 HOURS) 21 mg/24hr patch Place 1 patch (21 mg total) onto the skin daily. Step 1 for one month. Patient not taking: Reported  on 07/07/2019 03/29/19   Forrest Moron, MD  nicotine (NICODERM CQ - DOSED IN MG/24 HR) 7 mg/24hr patch Place 1 patch (7 mg total) onto the skin daily. Patient not taking: Reported on 07/07/2019 03/29/19   Forrest Moron, MD  nicotine polacrilex (NICORETTE) 4 MG gum Take 1 each (4 mg total) by mouth as needed for smoking cessation. Patient not taking: Reported on 07/07/2019 03/29/19   Forrest Moron, MD  POTASSIUM CITRATE PO Take by mouth daily.    [provider]  varenicline (CHANTIX CONTINUING MONTH PAK) 1 MG tablet Take 1 tablet (1 mg total) by mouth 2 (two) times daily. 03/29/19   Forrest Moron, MD  varenicline (CHANTIX STARTING MONTH PAK) 0.5 MG X 11 & 1 MG X 42 tablet Take one 0.5 mg tablet by mouth once daily for 3 days, then increase to one 0.5 mg tablet twice daily for 4 days, then increase to one 1 mg tablet twice daily. Patient not taking: Reported on 07/07/2019 03/29/19   Forrest Moron, MD     Depression screen [facility] 2/9 07/07/2019 06/28/2019 06/28/2019 03/29/2019 04/29/2018  Decreased Interest 0 0 0 0 0  Down, Depressed, Hopeless 0 0 0 0 0  PHQ - 2 Score 0 0 0 0 0  Altered sleeping - - - - -  Tired, decreased energy - - - - -  Change in appetite - - - - -  Feeling bad or failure about yourself  - - - - -  Trouble concentrating - - - - -  Moving slowly or fidgety/restless - - - - -  Suicidal thoughts - - - - -  PHQ-9 Score - - - - -     Fall Risk  07/07/2019 06/28/2019 06/28/2019 03/29/2019 04/29/2018  Falls in the past year? 0 0 0 0 0  Number falls in past yr: 0 0 - 0 0  Injury with Fall? 0 0 - 0  0  Follow up Falls evaluation completed;Education provided Falls evaluation completed Falls evaluation completed Falls evaluation completed -      PHYSICAL EXAM: BP 112/60 Comment: taken form a previous visit  Ht 5' 4.17" (1.63 m)   Wt 119 lb (54 kg)   BMI 20.32 kg/m    Wt Readings from Last 3 Encounters:  07/07/19 119 lb (54 kg)  07/01/19 119 lb (54 kg)   06/28/19 120 lb 3.2 oz (54.5 kg)      Education/Counseling provided regarding diet and exercise, prevention of chronic diseases, smoking/tobacco cessation, if applicable, and reviewed "Covered Medicare Preventive Services."

## 2019-07-07 NOTE — Patient Instructions (Addendum)
Thank you for taking time to come for your Medicare Wellness Visit. I appreciate your ongoing commitment to your health goals. Please review the following plan we discussed and let me know if I can assist you in the future.  Marlisha Vanwyk LPN Preventive Care 75 Years and Older, Female Preventive care refers to lifestyle choices and visits with your health care provider that can promote health and wellness. This includes:  A yearly physical exam. This is also called an annual well check.  Regular dental and eye exams.  Immunizations.  Screening for certain conditions.  Healthy lifestyle choices, such as diet and exercise. What can I expect for my preventive care visit? Physical exam Your health care provider will check:  Height and weight. These may be used to calculate body mass index (BMI), which is a measurement that tells if you are at a healthy weight.  Heart rate and blood pressure.  Your skin for abnormal spots. Counseling Your health care provider may ask you questions about:  Alcohol, tobacco, and drug use.  Emotional well-being.  Home and relationship well-being.  Sexual activity.  Eating habits.  History of falls.  Memory and ability to understand (cognition).  Work and work environment.  Pregnancy and menstrual history. What immunizations do I need?  Influenza (flu) vaccine  This is recommended every year. Tetanus, diphtheria, and pertussis (Tdap) vaccine  You may need a Td booster every 10 years. Varicella (chickenpox) vaccine  You may need this vaccine if you have not already been vaccinated. Zoster (shingles) vaccine  You may need this after age 60. Pneumococcal conjugate (PCV13) vaccine  One dose is recommended after age 75. Pneumococcal polysaccharide (PPSV23) vaccine  One dose is recommended after age 75. Measles, mumps, and rubella (MMR) vaccine  You may need at least one dose of MMR if you were born in 1957 or later. You may also need  a second dose. Meningococcal conjugate (MenACWY) vaccine  You may need this if you have certain conditions. Hepatitis A vaccine  You may need this if you have certain conditions or if you travel or work in places where you may be exposed to hepatitis A. Hepatitis B vaccine  You may need this if you have certain conditions or if you travel or work in places where you may be exposed to hepatitis B. Haemophilus influenzae type b (Hib) vaccine  You may need this if you have certain conditions. You may receive vaccines as individual doses or as more than one vaccine together in one shot (combination vaccines). Talk with your health care provider about the risks and benefits of combination vaccines. What tests do I need? Blood tests  Lipid and cholesterol levels. These may be checked every 5 years, or more frequently depending on your overall health.  Hepatitis C test.  Hepatitis B test. Screening  Lung cancer screening. You may have this screening every year starting at age 55 if you have a 30-pack-year history of smoking and currently smoke or have quit within the past 15 years.  Colorectal cancer screening. All adults should have this screening starting at age 50 and continuing until age 75. Your health care provider may recommend screening at age 45 if you are at increased risk. You will have tests every 1-10 years, depending on your results and the type of screening test.  Diabetes screening. This is done by checking your blood sugar (glucose) after you have not eaten for a while (fasting). You may have this done every 1-3 years.    Mammogram. This may be done every 1-2 years. Talk with your health care provider about how often you should have regular mammograms.  BRCA-related cancer screening. This may be done if you have a family history of breast, ovarian, tubal, or peritoneal cancers. Other tests  Sexually transmitted disease (STD) testing.  Bone density scan. This is done to  screen for osteoporosis. You may have this done starting at age 31. Follow these instructions at home: Eating and drinking  Eat a diet that includes fresh fruits and vegetables, whole grains, lean protein, and low-fat dairy products. Limit your intake of foods with high amounts of sugar, saturated fats, and salt.  Take vitamin and mineral supplements as recommended by your health care provider.  Do not drink alcohol if your health care provider tells you not to drink.  If you drink alcohol: ? Limit how much you have to 0-1 drink a day. ? Be aware of how much alcohol is in your drink. In the [place] one drink equals one 12 oz bottle of beer (355 mL), one 5 oz glass of wine (148 mL), or one 1 oz glass of hard liquor (44 mL). Lifestyle  Take daily care of your teeth and gums.  Stay active. Exercise for at least 30 minutes on 5 or more days each week.  Do not use any products that contain nicotine or tobacco, such as cigarettes, e-cigarettes, and chewing tobacco. If you need help quitting, ask your health care provider.  If you are sexually active, practice safe sex. Use a condom or other form of protection in order to prevent STIs (sexually transmitted infections).  Talk with your health care provider about taking a low-dose aspirin or statin. What's next?  Go to your health care provider once a year for a well check visit.  Ask your health care provider how often you should have your eyes and teeth checked.  Stay up to date on all vaccines. This information is not intended to replace advice given to you by your health care provider. Make sure you discuss any questions you have with your health care provider. Document Revised: 04/15/2018 Document Reviewed: 04/15/2018 Elsevier Patient Education  2020 [employer].

## 2019-07-12 ENCOUNTER — Telehealth (INDEPENDENT_AMBULATORY_CARE_PROVIDER_SITE_OTHER): Payer: PPO | Admitting: Family Medicine

## 2019-07-12 ENCOUNTER — Other Ambulatory Visit: Payer: Self-pay

## 2019-07-12 NOTE — Progress Notes (Signed)
Patient is in the precontemplation stage. Will reschedule when patient has started quitting.    Discussed briefly that she can take chantix while still smoking.

## 2019-07-12 NOTE — Progress Notes (Signed)
The patient wants to cancel appointment and we let pt reach out to [place] when she is ready to discuss smoking cessation and patient has chantix to start when ready.

## 2019-07-13 ENCOUNTER — Encounter (HOSPITAL_COMMUNITY)
Admission: RE | Admit: 2019-07-13 | Discharge: 2019-07-13 | Disposition: A | Discharge status: Home / Self Care | Payer: PPO | Source: Ambulatory Visit | Attending: Internal Medicine | Admitting: Internal Medicine

## 2019-07-13 ENCOUNTER — Other Ambulatory Visit: Payer: Self-pay

## 2019-07-13 DIAGNOSIS — E0590 Thyrotoxicosis, unspecified without thyrotoxic crisis or storm: Secondary | ICD-10-CM | POA: Insufficient documentation

## 2019-07-13 MED ORDER — SODIUM IODIDE I-123 7.4 MBQ CAPS
494.0000 | ORAL_CAPSULE | Freq: Once | ORAL | Status: AC
  Administered 2019-07-13: 494 via ORAL

## 2019-07-14 ENCOUNTER — Ambulatory Visit (HOSPITAL_COMMUNITY)
Admission: RE | Admit: 2019-07-14 | Discharge: 2019-07-14 | Disposition: A | Discharge status: Home / Self Care | Payer: PPO | Source: Ambulatory Visit | Attending: Internal Medicine | Admitting: Internal Medicine

## 2019-07-14 DIAGNOSIS — E0590 Thyrotoxicosis, unspecified without thyrotoxic crisis or storm: Secondary | ICD-10-CM | POA: Diagnosis not present

## 2019-07-14 DIAGNOSIS — E042 Nontoxic multinodular goiter: Secondary | ICD-10-CM | POA: Diagnosis not present

## 2019-07-14 NOTE — Addendum Note (Signed)
Addended by: Philemon Kingdom on: 07/14/2019 04:52 PM   Modules accepted: Orders

## 2019-07-19 ENCOUNTER — Telehealth: Payer: Self-pay

## 2019-07-19 ENCOUNTER — Ambulatory Visit: Payer: PPO | Attending: Family

## 2019-07-19 DIAGNOSIS — Z23 Encounter for immunization: Secondary | ICD-10-CM

## 2019-07-19 NOTE — Telephone Encounter (Signed)
-----   Message from Philemon Kingdom, MD sent at 07/14/2019  4:52 PM EST ----- Lenna Sciara, can you please call pt:  Her thyroid uptake and scan showed that she has several areas in the thyroid that reduce slightly more thyroid hormones.  Overall, the hormone production was not very high.  For now, we can continue to follow her without treatment but I would like to repeat her TFTs in 3 months, though, rather than at next visit.  Labs are in.

## 2019-07-19 NOTE — Progress Notes (Signed)
   Covid-19 Vaccination Clinic  Name:  Terry Armstrong    MRN: [id number] DOB: Oct 31, 1945  07/19/2019  Terry Armstrong was observed post Covid-19 immunization for 15 minutes without incident. She was provided with Vaccine Information Sheet and instruction to access the V-Safe system.   Terry Armstrong was instructed to call 911 with any severe reactions post vaccine: Marland Kitchen Difficulty breathing  . Swelling of face and throat  . A fast heartbeat  . A bad rash all over body  . Dizziness and weakness   Immunizations Administered    Name Date Dose VIS Date Route   Moderna COVID-19 Vaccine 07/19/2019 11:35 AM 0.5 mL 04/05/2019 Intramuscular   Manufacturer: Moderna   Lot: [id number]   [id number]

## 2019-07-22 NOTE — Telephone Encounter (Signed)
Left message for patient to return our call at [phone].  

## 2019-08-01 NOTE — Telephone Encounter (Signed)
Left message for patient to return our call at [phone].  

## 2019-08-15 ENCOUNTER — Other Ambulatory Visit: Payer: Self-pay | Admitting: Family Medicine

## 2019-08-18 ENCOUNTER — Encounter: Payer: Self-pay | Admitting: *Deleted

## 2019-08-18 ENCOUNTER — Encounter: Payer: Self-pay | Admitting: Cardiovascular Disease

## 2019-08-18 ENCOUNTER — Other Ambulatory Visit: Payer: Self-pay

## 2019-08-18 ENCOUNTER — Ambulatory Visit: Payer: PPO | Admitting: Cardiovascular Disease

## 2019-08-18 VITALS — BP 126/78 | HR 75 | Temp 98.1°F | Ht 65.0 in | Wt 118.8 lb

## 2019-08-18 DIAGNOSIS — R0600 Dyspnea, unspecified: Secondary | ICD-10-CM

## 2019-08-18 DIAGNOSIS — Z720 Tobacco use: Secondary | ICD-10-CM | POA: Diagnosis not present

## 2019-08-18 DIAGNOSIS — I10 Essential (primary) hypertension: Secondary | ICD-10-CM

## 2019-08-18 DIAGNOSIS — R0609 Other forms of dyspnea: Secondary | ICD-10-CM

## 2019-08-18 DIAGNOSIS — R002 Palpitations: Secondary | ICD-10-CM

## 2019-08-18 DIAGNOSIS — G4733 Obstructive sleep apnea (adult) (pediatric): Secondary | ICD-10-CM | POA: Diagnosis not present

## 2019-08-18 DIAGNOSIS — E785 Hyperlipidemia, unspecified: Secondary | ICD-10-CM

## 2019-08-18 HISTORY — DX: Dyspnea, unspecified: R06.00

## 2019-08-18 HISTORY — DX: Other forms of dyspnea: R06.09

## 2019-08-18 HISTORY — DX: Palpitations: R00.2

## 2019-08-18 NOTE — Progress Notes (Signed)
Cardiology Office Note   Date:  08/18/2019   ID:  Terry Armstrong, DOB Jun 01, 1945, MRN [id number]  PCP:  Terry Moron, MD  Cardiologist:   Terry Latch, MD   No chief complaint on file.   History of Present Illness: Terry Armstrong is a 74 y.o. female with hypertension, who presents for an evaluation of chest discomfort.  For the last 3 months she has been having intermittent episodes of night sweats.  She feels like her heart is racing and like she needs to belch.  Sometimes belching helps, sometimes it does not.  It always seems to occur when laying in bed at night.  The episodes last for quite a while.  There is no associated chest pain but she just feels funny in her chest and it feels full.  She denies lightheadedness, dizziness, or nausea.  However she gets very clammy and breaks out into a sweat.  This makes her start to feel panicky.  She sometimes tries to drink some water and has a hard time getting it down.  The episodes occur a few times per month.  Does not seem to be associated with heartburn or eating any particular meals.  She does not get much formal exercise but is the primary caregiver for her husband and brother.  She lives in a 3 story home.  She notices that sometimes when she goes from the bottom to the top she gets very winded.  She does not have chest pain with this activity.  She has not noted any lower extremity edema, orthopnea, or PND.  Of note, her sister has congestive heart failure and has ICD.  She discussed the symptoms with her PCP and she was referred to cardiology for further evaluation.  Dr. Nolon Armstrong also recommended smoking cessation and gave her a prescription of Chantix which she purchased but has not yet started using.  She is still thinking about it.  Her husband had lung cancer and continues to smoke.  He encourages her to quit but is not interested in quitting himself.  Past Medical History:  Diagnosis Date  . Allergy   . Breast abscess   .  Constipation    if drinks alot of water no issues - uses womens stool softener PRN only   . Diabetes mellitus without complication ([place])    pt has no meds- diet controlled only   . Exertional dyspnea 08/18/2019  . Hx of adenomatous colonic polyps 06/19/2017  . Hyperlipidemia   . Palpitations 08/18/2019  . Primary snoring     Past Surgical History:  Procedure Laterality Date  . ABDOMINAL HYSTERECTOMY  1993  . COLONOSCOPY    . POLYPECTOMY       Current Outpatient Medications  Medication Sig Dispense Refill  . albuterol (VENTOLIN HFA) 108 (90 Base) MCG/ACT inhaler INHALE 2 PUFFS INTO THE LUNGS  EVERY 6 HOURS AS NEEDED FOR WHEEZING FOR SHORTNESS OF BREATH 18 g 0  . alendronate (FOSAMAX) 70 MG tablet Take one tablet once weekly. Take with a full glass of water on an empty stomach. 12 tablet 3  . aspirin EC 81 MG tablet Take 1 tablet (81 mg total) by mouth daily.    Marland Kitchen atorvastatin (LIPITOR) 40 MG tablet Take 1 tablet (40 mg total) by mouth daily. 90 tablet 1  . fluticasone (FLONASE) 50 MCG/ACT nasal spray Place 2 sprays into both nostrils at bedtime. 16 g 2  . loratadine (CLARITIN) 10 MG tablet Take 10 mg by mouth daily.  1 tablet daily as needed    . losartan (COZAAR) 25 MG tablet Take 1 tablet (25 mg total) by mouth daily. 90 tablet 1  . multivitamin-iron-minerals-folic acid (CENTRUM) chewable tablet Chew 1 tablet by mouth daily.    . Omega-3 Fatty Acids (FISH OIL) 1200 MG CAPS Take 1 capsule (1,200 mg total) by mouth 2 (two) times daily. 60 capsule 3  . POTASSIUM CITRATE PO Take by mouth daily.     No current facility-administered medications for this visit.    Allergies:   Patient has no known allergies.    Social History:  The patient  reports that she has been smoking cigarettes. She has a 10.20 pack-year smoking history. She has never used smokeless tobacco. She reports current alcohol use. She reports that she does not use drugs.   Family History:  The patient's family history  includes Cancer in her maternal grandmother; Diabetes in her brother, father, maternal aunt, maternal aunt, mother, and another family member; Heart Problems in her maternal grandmother; Heart disease in her maternal grandmother and sister; Hyperlipidemia in an other family member; Hypertension in her maternal aunt, maternal grandmother, and sister; Kidney failure in her maternal aunt.    ROS:  Please see the history of present illness.   Otherwise, review of systems are positive for none.   All other systems are reviewed and negative.    PHYSICAL EXAM: VS:  BP 126/78   Pulse 75   Temp 98.1 F (36.7 C)   Ht 5\' 5"  (1.651 m)   Wt 118 lb 12.8 oz (53.9 kg)   BMI 19.77 kg/m  , BMI Body mass index is 19.77 kg/m. GENERAL:  Well appearing HEENT:  Pupils equal round and reactive, fundi not visualized, oral mucosa unremarkable NECK:  No jugular venous distention, waveform within normal limits, carotid upstroke brisk and symmetric, no bruits, no thyromegaly LYMPHATICS:  No cervical adenopathy LUNGS:  Clear to auscultation bilaterally HEART:  RRR.  PMI not displaced or sustained,S1 and S2 within normal limits, no S3, no S4, no clicks, no rubs, no murmurs ABD:  Flat, positive bowel sounds normal in frequency in pitch, no bruits, no rebound, no guarding, no midline pulsatile mass, no hepatomegaly, no splenomegaly EXT:  2 plus pulses throughout, no edema, no cyanosis no clubbing SKIN:  No rashes no nodules NEURO:  Cranial nerves II through XII grossly intact, motor grossly intact throughout PSYCH:  Cognitively intact, oriented to person place and time    EKG:  EKG is ordered today. The ekg ordered today demonstrates sinus rhythm.  Rate 75 bpm.  Cannot rule out prior septal infarct.   Recent Labs: 06/28/2019: ALT 31; BUN 14; Creatinine, Ser 0.84; Hemoglobin 14.1; Potassium 4.8; Sodium 144 07/01/2019: TSH 0.20    Lipid Panel    Component Value Date/Time   CHOL 164 06/28/2019 0937   TRIG 55  06/28/2019 0937   HDL 72 06/28/2019 0937   CHOLHDL 2.3 06/28/2019 0937   CHOLHDL 3.7 07/26/2015 0931   VLDL 30 07/26/2015 0931   LDLCALC 81 06/28/2019 0937   LDLDIRECT 81 04/29/2018 1605      Wt Readings from Last 3 Encounters:  08/18/19 118 lb 12.8 oz (53.9 kg)  07/07/19 119 lb (54 kg)  07/01/19 119 lb (54 kg)      ASSESSMENT AND PLAN:  # Palpitations:  We will get a 30-day ambulatory monitor to better understand her heart rhythm at the time of these episodes.  Her thyroid was mildly abnormal when tested  and she has repeat testing planned with her PCP.  Labs have otherwise been unremarkable.  Given that her symptoms seem to occur when lying down.  I suspect that it may be related to GERD, though she does not notice having any heartburn symptoms.  It seems like likely attributable to ischemia.  She previously had a sleep study that showed mild sleep apnea in 2015 but not enough to require a CPAP.  # Exertional dyspnea: Ms. Ortmann has some exertional dyspnea.  She has no chest pain.  Given her smoking history we will get an ETT to evaluate for ischemia.  There is no evidence of heart failure on exam. ETT  # Tobacco abuse: Encouraged her to use the Chantix and quit smoking.   # Hypertension:  BP well-controlled.  Continue losartan.   Current medicines are reviewed at length with the patient today.  The patient does not have concerns regarding medicines.  The following changes have been made:  no change  Labs/ tests ordered today include:   Orders Placed This Encounter  Procedures  . CARDIAC EVENT MONITOR  . Exercise Tolerance Test  . EKG 12-Lead     Disposition:   FU with Denaya Horn C. Oval Linsey, MD, [facility] in 2 months.      Signed, Ophelia Sipe C. Oval Linsey, MD, [facility]  08/18/2019 6:17 PM    [place]

## 2019-08-18 NOTE — Progress Notes (Signed)
Patient ID: Terry Armstrong, female   DOB: 06-03-1945, 74 y.o.   MRN: [id number] Patient enrolled for [employer] to ship a 30 day cardiac event monitor to her home.

## 2019-08-18 NOTE — Patient Instructions (Signed)
Medication Instructions:  Your physician recommends that you continue on your current medications as directed. Please refer to the Current Medication list given to you today.  *If you need a refill on your cardiac medications before your next appointment, please call your pharmacy*  Lab Work: YOU WILL NEED A COVID SCREENING 3 DAYS PRIOR TO YOUR STRESS TEST, YOU WILL NEED TO QUARANTINE YOURSELF UNTIL AFTER THE STRESS TEST     Testing/Procedures: Your physician has requested that you have an exercise tolerance test. For further information please visit [email or web address]. Please also follow instruction sheet, as given.  Your physician has recommended that you wear an event monitor. Event monitors are medical devices that record the heart's electrical activity. Doctors most often [place] these monitors to diagnose arrhythmias. Arrhythmias are problems with the speed or rhythm of the heartbeat. The monitor is a small, portable device. You can wear one while you do your normal daily activities. This is usually used to diagnose what is causing palpitations/syncope (passing out). 71 DAY   Follow-Up: At [facility], you and your health needs are our priority.  As part of our continuing mission to provide you with exceptional heart care, we have created designated Provider Care Teams.  These Care Teams include your primary Cardiologist (physician) and Advanced Practice Providers (APPs -  Physician Assistants and Nurse Practitioners) who all work together to provide you with the care you need, when you need it.  We recommend signing up for the patient portal called "MyChart".  Sign up information is provided on this After Visit Summary.  MyChart is used to connect with patients for Virtual Visits (Telemedicine).  Patients are able to view lab/test results, encounter notes, upcoming appointments, etc.  Non-urgent messages can be sent to your provider as well.   To learn more about what you can do with MyChart,  go to [email or web address].    Your next appointment:   2 month(s)  The format for your next appointment:   Either In Person or Virtual  Provider:   You may see DR [facility]  or one of the following Advanced Practice Providers on your designated Care Team:    Kerin Ransom, PA-C  [place], [place]  Coletta Memos, [place]  Other Instructions  [employer] Cardiac Event Monitor Instructions Your physician has requested you wear your cardiac event monitor for _30__ days, (1-30). [employer] may call or text to confirm a shipping address. The monitor will be sent to a land address via UPS. [employer] will not ship a monitor to a PO BOX. It typically takes 3-5 days to receive your monitor after it has been enrolled. [employer] will assist with USPS tracking if your package is delayed. The telephone number for [employer] is [phone]. Once you have received your monitor, please review the enclosed instructions. Instruction tutorials can also be viewed under help and settings on the enclosed cell phone. Your monitor has already been registered assigning a specific monitor serial # to you.  Applying the monitor Remove cell phone from case and turn it on. The cell phone works as Dealer and needs to be within [employer] of you at all times. The cell phone will need to be charged on a daily basis. We recommend you plug the cell phone into the enclosed charger at your bedside table every night.  Monitor batteries: You will receive two monitor batteries labelled #1 and #2. These are your recorders. Plug battery #2 onto the second connection on the enclosed charger. Keep one battery on  the charger at all times. This will keep the monitor battery deactivated. It will also keep it fully charged for when you need to switch your monitor batteries. A small light will be blinking on the battery emblem when it is charging. The light on the battery emblem will remain on when the battery is  fully charged.  Open package of a Monitor strip. Insert battery #1 into black hood on strip and gently squeeze monitor battery onto connection as indicated in instruction booklet. Set aside while preparing skin.  Choose location for your strip, vertical or horizontal, as indicated in the instruction booklet. Shave to remove all hair from location. There cannot be any lotions, oils, powders, or colognes on skin where monitor is to be applied. Wipe skin clean with enclosed Saline wipe. Dry skin completely.  Peel paper labeled #1 off the back of the Monitor strip exposing the adhesive. Place the monitor on the chest in the vertical or horizontal position shown in the instruction booklet. One arrow on the monitor strip must be pointing upward. Carefully remove paper labeled #2, attaching remainder of strip to your skin. Try not to create any folds or wrinkles in the strip as you apply it.  Firmly press and release the circle in the center of the monitor battery. You will hear a small beep. This is turning the monitor battery on. The heart emblem on the monitor battery will light up every 5 seconds if the monitor battery in turned on and connected to the patient securely. Do not push and hold the circle down as this turns the monitor battery off. The cell phone will locate the monitor battery. A screen will appear on the cell phone checking the connection of your monitor strip. This may read poor connection initially but change to good connection within the next minute. Once your monitor accepts the connection you will hear a series of 3 beeps followed by a climbing crescendo of beeps. A screen will appear on the cell phone showing the two monitor strip placement options. Touch the picture that demonstrates where you applied the monitor strip.  Your monitor strip and battery are waterproof. You are able to shower, bathe, or swim with the monitor on. They just ask you do not submerge deeper than 3  feet underwater. We recommend removing the monitor if you are swimming in a lake, river, or ocean.  Your monitor battery will need to be switched to a fully charged monitor battery approximately once a week. The cell phone will alert you of an action which needs to be made.  On the cell phone, tap for details to reveal connection status, monitor battery status, and cell phone battery status. The green dots indicates your monitor is in good status. A red dot indicates there is something that needs your attention.  To record a symptom, click the circle on the monitor battery. In 30-60 seconds a list of symptoms will appear on the cell phone. Select your symptom and tap save. Your monitor will record a sustained or significant arrhythmia regardless of you clicking the button. Some patients do not feel the heart rhythm irregularities. [employer] will notify us of any serious or critical events.  Refer to instruction booklet for instructions on switching batteries, changing strips, the Do not disturb or Pause features, or any additional questions.  Call [employer] at [phone], to confirm your monitor is transmitting and record your baseline. They will answer any questions you may have regarding the monitor instructions at that  time.  Returning the monitor to [place] all equipment back into blue box. Peel off strip of paper to expose adhesive and close box securely. There is a prepaid UPS shipping label on this box. Drop in a UPS drop box, or at a UPS facility like [employer]. You may also contact [employer] to arrange UPS to pick up monitor package at your home.   Exercise Stress Test An exercise stress test is a test to check how your heart works during exercise. You will need to walk on a treadmill or ride an exercise bike for this test. An electrocardiogram (ECG) will record your heartbeat when you are at rest and when you are exercising. You may have an ultrasound or nuclear  test after the exercise test. The test is done to check for coronary artery disease (CAD). It is also done to:  See how well you can exercise.  Watch for high blood pressure during exercise.  Test how well you can exercise after treatment.  Check the blood flow to your arms and legs. If your test result is not normal, more testing may be needed. What happens before the procedure?  Follow instructions from your doctor about what you cannot eat or drink. ? Do not have any drinks or foods that have caffeine in them for 24 hours before the test, or as told by your doctor. This includes coffee, tea (even decaf tea), sodas, chocolate, and cocoa.  Ask your doctor about changing or stopping your normal medicines. This is important if you: ? Take diabetes medicines. ? Take beta-blocker medicines. ? Wear a nitroglycerin patch.  If you use an inhaler, bring it with you to the test.  Do not put lotions, powders, creams, or oils on your chest before the test.  Wear comfortable shoes and clothing.  Do not use any products that have nicotine or tobacco in them, such as cigarettes and e-cigarettes. Stop using them at least 4 hours before the test. If you need help quitting, ask your doctor. What happens during the procedure?   Patches (electrodes) will be put on your chest.  Wires will be connected to the patches. The wires will send signals to a machine to record your heartbeat.  Your heart rate will be watched while you are resting and while you are exercising. Your blood pressure will also be watched during the test.  You will walk on a treadmill or use a stationary bike. If you cannot use these, you may be asked to turn a crank with your hands.  The activity will get harder and will raise your heart rate.  You may be asked to breathe into a tube a few times during the test. This measures the gases that you breathe out.  You will be asked how you are feeling throughout the test.  You  will exercise until your heart reaches a target heart rate. You will stop early if: ? You feel dizzy. ? You have chest pain. ? You are out of breath. ? Your blood pressure is too high or too low. ? You have an irregular heartbeat. ? You have pain or aching in your arms or legs. The procedure may vary among doctors and hospitals. What happens after the procedure?  Your blood pressure, heart rate, breathing rate, and blood oxygen level will be watched after the test.  You may return to your normal diet and activities as told by your doctor.  It is up to you to get the results of  your test. Ask your doctor, or the department that is doing the test, when your results will be ready. Summary  An exercise stress test is a test to check how your heart works during exercise.  This test is done to check for coronary artery disease.  Your heart rate will be watched while you are resting and while you are exercising.  Follow instructions from your doctor about what you cannot eat or drink before the test. This information is not intended to replace advice given to you by your health care provider. Make sure you discuss any questions you have with your health care provider. Document Revised: 08/03/2018 Document Reviewed: 07/22/2016 Elsevier Patient Education  [place].

## 2019-08-23 ENCOUNTER — Ambulatory Visit (INDEPENDENT_AMBULATORY_CARE_PROVIDER_SITE_OTHER): Payer: PPO

## 2019-08-23 DIAGNOSIS — R0600 Dyspnea, unspecified: Secondary | ICD-10-CM

## 2019-08-23 DIAGNOSIS — R0609 Other forms of dyspnea: Secondary | ICD-10-CM

## 2019-08-23 DIAGNOSIS — R002 Palpitations: Secondary | ICD-10-CM | POA: Diagnosis not present

## 2019-08-29 ENCOUNTER — Other Ambulatory Visit (HOSPITAL_COMMUNITY)
Admission: RE | Admit: 2019-08-29 | Discharge: 2019-08-29 | Disposition: A | Discharge status: Home / Self Care | Payer: PPO | Source: Ambulatory Visit | Attending: Cardiovascular Disease | Admitting: Cardiovascular Disease

## 2019-08-29 DIAGNOSIS — Z20822 Contact with and (suspected) exposure to covid-19: Secondary | ICD-10-CM | POA: Insufficient documentation

## 2019-08-29 DIAGNOSIS — Z01812 Encounter for preprocedural laboratory examination: Secondary | ICD-10-CM | POA: Insufficient documentation

## 2019-08-29 LAB — SARS CORONAVIRUS 2 (TAT 6-24 HRS): SARS Coronavirus 2: NEGATIVE

## 2019-08-30 ENCOUNTER — Telehealth (HOSPITAL_COMMUNITY): Payer: Self-pay

## 2019-08-30 NOTE — Progress Notes (Signed)
Called pt, gave results. Asked that letter be mailed

## 2019-08-30 NOTE — Telephone Encounter (Signed)
Encounter complete. 

## 2019-09-01 ENCOUNTER — Other Ambulatory Visit: Payer: Self-pay

## 2019-09-01 ENCOUNTER — Ambulatory Visit (HOSPITAL_COMMUNITY)
Admission: RE | Admit: 2019-09-01 | Discharge: 2019-09-01 | Disposition: A | Discharge status: Home / Self Care | Payer: PPO | Source: Ambulatory Visit | Attending: Cardiology | Admitting: Cardiology

## 2019-09-01 DIAGNOSIS — R0600 Dyspnea, unspecified: Secondary | ICD-10-CM | POA: Diagnosis not present

## 2019-09-01 DIAGNOSIS — R0609 Other forms of dyspnea: Secondary | ICD-10-CM

## 2019-09-01 DIAGNOSIS — R002 Palpitations: Secondary | ICD-10-CM | POA: Diagnosis not present

## 2019-09-01 NOTE — Telephone Encounter (Signed)
No action needed

## 2019-09-02 LAB — EXERCISE TOLERANCE TEST
Estimated workload: 4.1 METS
Exercise duration (min): 1 min
Exercise duration (sec): 46 s
MPHR: 146 {beats}/min
Peak HR: 133 {beats}/min
Percent HR: 91 %
RPE: 19
Rest HR: 76 {beats}/min

## 2019-09-05 ENCOUNTER — Telehealth: Payer: Self-pay | Admitting: Cardiovascular Disease

## 2019-09-05 NOTE — Telephone Encounter (Signed)
Spoke with patient and advised to start with contacting monitor company first to see if they can send her hypoallergenic electrodes first and if continues to call back. Patient verbalized understanding

## 2019-09-05 NOTE — Telephone Encounter (Signed)
Patient states that she is wearing a 30 day heart monitor. She put it on around April 17th or 18th but it is starting to irritate her. She wants to know if enough information has been received for her to take the monitor off. Please advise.

## 2019-09-07 ENCOUNTER — Telehealth: Payer: Self-pay | Admitting: *Deleted

## 2019-09-07 ENCOUNTER — Ambulatory Visit: Payer: PPO | Admitting: Family Medicine

## 2019-09-07 DIAGNOSIS — R0609 Other forms of dyspnea: Secondary | ICD-10-CM

## 2019-09-07 DIAGNOSIS — I10 Essential (primary) hypertension: Secondary | ICD-10-CM

## 2019-09-07 DIAGNOSIS — E785 Hyperlipidemia, unspecified: Secondary | ICD-10-CM

## 2019-09-07 DIAGNOSIS — R002 Palpitations: Secondary | ICD-10-CM

## 2019-09-07 NOTE — Telephone Encounter (Signed)
-----   Message from Skeet Latch, MD sent at 09/04/2019  1:19 PM EDT ----- Stress test showed that she is deconditioned.  Although there were no clear signs of abnormalities with her heart, she did not exercise long enough to really get a good assessment.  Recommend Lexiscan Myoview.

## 2019-09-07 NOTE — Telephone Encounter (Signed)
Advised patient, verbalized understanding Order placed and message to scheduling to arrange  

## 2019-09-08 ENCOUNTER — Telehealth: Payer: Self-pay | Admitting: *Deleted

## 2019-09-08 NOTE — Telephone Encounter (Signed)
Left message for patient to call and schedule Lexiscan myoview ordered by Dr. Oval Linsey

## 2019-09-09 NOTE — Telephone Encounter (Signed)
Left message for patient to call and schedule Lexiscan myoview ordered by Dr. Oval Linsey

## 2019-09-11 ENCOUNTER — Telehealth: Payer: Self-pay | Admitting: Cardiology

## 2019-09-11 ENCOUNTER — Encounter (HOSPITAL_COMMUNITY): Payer: Self-pay | Admitting: *Deleted

## 2019-09-11 ENCOUNTER — Other Ambulatory Visit: Payer: Self-pay

## 2019-09-11 ENCOUNTER — Emergency Department (HOSPITAL_COMMUNITY)
Admission: EM | Admit: 2019-09-11 | Discharge: 2019-09-11 | Disposition: A | Discharge status: Home / Self Care | Payer: PPO | Attending: Emergency Medicine | Admitting: Emergency Medicine

## 2019-09-11 ENCOUNTER — Emergency Department (HOSPITAL_COMMUNITY): Payer: PPO

## 2019-09-11 ENCOUNTER — Emergency Department (HOSPITAL_BASED_OUTPATIENT_CLINIC_OR_DEPARTMENT_OTHER): Payer: PPO

## 2019-09-11 DIAGNOSIS — F17210 Nicotine dependence, cigarettes, uncomplicated: Secondary | ICD-10-CM | POA: Insufficient documentation

## 2019-09-11 DIAGNOSIS — Z7984 Long term (current) use of oral hypoglycemic drugs: Secondary | ICD-10-CM | POA: Insufficient documentation

## 2019-09-11 DIAGNOSIS — I472 Ventricular tachycardia, unspecified: Secondary | ICD-10-CM

## 2019-09-11 DIAGNOSIS — I10 Essential (primary) hypertension: Secondary | ICD-10-CM | POA: Diagnosis not present

## 2019-09-11 DIAGNOSIS — I4729 Other ventricular tachycardia: Secondary | ICD-10-CM

## 2019-09-11 DIAGNOSIS — Z03818 Encounter for observation for suspected exposure to other biological agents ruled out: Secondary | ICD-10-CM | POA: Diagnosis not present

## 2019-09-11 DIAGNOSIS — R000 Tachycardia, unspecified: Secondary | ICD-10-CM | POA: Diagnosis present

## 2019-09-11 DIAGNOSIS — R0602 Shortness of breath: Secondary | ICD-10-CM | POA: Diagnosis not present

## 2019-09-11 DIAGNOSIS — Z20822 Contact with and (suspected) exposure to covid-19: Secondary | ICD-10-CM | POA: Diagnosis not present

## 2019-09-11 DIAGNOSIS — Z7982 Long term (current) use of aspirin: Secondary | ICD-10-CM | POA: Insufficient documentation

## 2019-09-11 DIAGNOSIS — Z79899 Other long term (current) drug therapy: Secondary | ICD-10-CM | POA: Diagnosis not present

## 2019-09-11 DIAGNOSIS — E119 Type 2 diabetes mellitus without complications: Secondary | ICD-10-CM | POA: Diagnosis not present

## 2019-09-11 HISTORY — DX: Ventricular tachycardia, unspecified: I47.20

## 2019-09-11 HISTORY — DX: Ventricular tachycardia: I47.2

## 2019-09-11 LAB — CBC
HCT: 39.2 % (ref 36.0–46.0)
Hemoglobin: 13 g/dL (ref 12.0–15.0)
MCH: 32.3 pg (ref 26.0–34.0)
MCHC: 33.2 g/dL (ref 30.0–36.0)
MCV: 97.5 fL (ref 80.0–100.0)
Platelets: 255 10*3/uL (ref 150–400)
RBC: 4.02 MIL/uL (ref 3.87–5.11)
RDW: 13.7 % (ref 11.5–15.5)
WBC: 7.4 10*3/uL (ref 4.0–10.5)
nRBC: 0 % (ref 0.0–0.2)

## 2019-09-11 LAB — BASIC METABOLIC PANEL
Anion gap: 6 (ref 5–15)
BUN: 10 mg/dL (ref 8–23)
CO2: 26 mmol/L (ref 22–32)
Calcium: 9.1 mg/dL (ref 8.9–10.3)
Chloride: 110 mmol/L (ref 98–111)
Creatinine, Ser: 0.67 mg/dL (ref 0.44–1.00)
GFR calc Af Amer: >60 mL/min (ref >60)
GFR calc non Af Amer: >60 mL/min (ref >60)
Glucose, Bld: 112 mg/dL — ABNORMAL HIGH (ref 70–99)
Potassium: 4.4 mmol/L (ref 3.5–5.1)
Sodium: 142 mmol/L (ref 135–145)

## 2019-09-11 LAB — T4, FREE: Free T4: 1.08 ng/dL (ref 0.61–1.12)

## 2019-09-11 LAB — TROPONIN I (HIGH SENSITIVITY): Troponin I (High Sensitivity): 5 ng/L (ref <18)

## 2019-09-11 LAB — ECHOCARDIOGRAM COMPLETE
Height: 65 in
Weight: 1920 oz

## 2019-09-11 LAB — RESPIRATORY PANEL BY RT PCR (FLU A&B, COVID)
Influenza A by PCR: NEGATIVE
Influenza B by PCR: NEGATIVE
SARS Coronavirus 2 by RT PCR: NEGATIVE

## 2019-09-11 LAB — TSH: TSH: 0.192 u[IU]/mL — ABNORMAL LOW (ref 0.350–4.500)

## 2019-09-11 LAB — MAGNESIUM: Magnesium: 1.9 mg/dL (ref 1.7–2.4)

## 2019-09-11 MED ORDER — METOPROLOL SUCCINATE ER 25 MG PO TB24
25.0000 mg | ORAL_TABLET | Freq: Every day | ORAL | Status: DC
  Administered 2019-09-11: 25 mg via ORAL
  Filled 2019-09-11: qty 1

## 2019-09-11 MED ORDER — METOPROLOL SUCCINATE ER 25 MG PO TB24: 25.0000 mg | ORAL_TABLET | Freq: Every day | ORAL | 11 refills | Status: DC

## 2019-09-11 MED ORDER — SODIUM CHLORIDE 0.9% FLUSH: 3.0000 mL | Freq: Once | INTRAVENOUS | Status: DC

## 2019-09-11 NOTE — Discharge Instructions (Addendum)
Follow-up with Dr. Oval Linsey as directed.  Return here if you have any worsening symptoms.

## 2019-09-11 NOTE — H&P (Addendum)
Cardiology Admission History and Physical:   Patient ID: Terry Armstrong; MRN: [id number]; DOB: 02-15-1946   Admission date: 09/11/2019  Primary Care Provider: Forrest Moron, MD Primary Cardiologist: Skeet Latch, MD 08/18/2019 Primary Electrophysiologist: None    Chief Complaint: Tatian's, VT on monitor  Patient Profile:   Terry Armstrong is a 74 y.o. female with a history of diet-controlled DM, hypertension, hyperlipidemia, tobacco use, palpitations, snoring without treatable sleep apnea.  History of Present Illness:   Terry Armstrong was referred to Dr. Oval Linsey for intermittent episodes of night sweats, heart racing, chest discomfort (feels "funny").  She had an ETT that was nondiagnostic because she has poor exercise tolerance and and was on the treadmill less than 3 minutes.  A Lexiscan Myoview was ordered.  A monitor was ordered and she has been wearing it.  Monitor company called because she had 9 seconds of VT.  She was asked to come to the emergency room.  She was sleeping at the time that this happened.  However, she has episodes 3 or 4 times a month where she will have sweats, a funny feeling in her chest, feeling that her heart is racing.  This can be associated with diaphoresis.  She is very busy caring for her disabled husband and a brother who is ill.  She goes up and down stairs all the time.  She can climb a flight of stairs without stopping.  She will get short of breath with exertion, but has always put that down to tobacco use.  The amount of exertion it takes for her to get short of breath has not changed recently.  She has no history of exertional chest pain.  She has no history of presyncope or syncope.  She denies lower extremity edema, no orthopnea or PND.  One of the reasons she is concerned is because her sister has a history of CHF and has an ICD.  She does not think her sister's ICD has ever fired, but her sister lives in [place] and they are not close.   Terry Armstrong does not know what kind of CHF her sister has.   Past Medical History:  Diagnosis Date  . Allergy   . Breast abscess   . Constipation    if drinks alot of water no issues - uses womens stool softener PRN only   . Diabetes mellitus without complication ([place])    pt has no meds- diet controlled only   . Exertional dyspnea 08/18/2019  . Hx of adenomatous colonic polyps 06/19/2017  . Hyperlipidemia   . Palpitations 08/18/2019  . Primary snoring   . VT (ventricular tachycardia) ([place]) 09/11/2019    Past Surgical History:  Procedure Laterality Date  . ABDOMINAL HYSTERECTOMY  1993  . COLONOSCOPY    . POLYPECTOMY       Medications Prior to Admission: Prior to Admission medications   Medication Sig Start Date End Date Taking? Authorizing Provider  albuterol (VENTOLIN HFA) 108 (90 Base) MCG/ACT inhaler INHALE 2 PUFFS INTO THE LUNGS  EVERY 6 HOURS AS NEEDED FOR WHEEZING FOR SHORTNESS OF BREATH Patient taking differently: Inhale 2 puffs into the lungs every 6 (six) hours as needed for wheezing or shortness of breath.  08/15/19  Yes Forrest Moron, MD  aspirin EC 81 MG tablet Take 1 tablet (81 mg total) by mouth daily. 06/28/19  Yes Delia Chimes A, MD  atorvastatin (LIPITOR) 40 MG tablet Take 1 tablet (40 mg total) by mouth daily. 02/14/19  Yes Stallings,  Zoe A, MD  fluticasone (FLONASE) 50 MCG/ACT nasal spray Place 2 sprays into both nostrils at bedtime. Patient taking differently: Place 2 sprays into both nostrils daily as needed for allergies.  07/07/19  Yes Forrest Moron, MD  loratadine (CLARITIN) 10 MG tablet Take 10 mg by mouth daily as needed for allergies or rhinitis.    Yes [provider]  losartan (COZAAR) 25 MG tablet Take 1 tablet (25 mg total) by mouth daily. 02/14/19  Yes Forrest Moron, MD  multivitamin-iron-minerals-folic acid (CENTRUM) chewable tablet Chew 1 tablet by mouth daily.   Yes [provider]  Omega-3 Fatty Acids (FISH OIL) 1200 MG CAPS  Take 1 capsule (1,200 mg total) by mouth 2 (two) times daily. Patient taking differently: Take 1 capsule by mouth daily.  04/04/16  Yes Nche, Charlene Brooke, NP  alendronate (FOSAMAX) 70 MG tablet Take one tablet once weekly. Take with a full glass of water on an empty stomach. 09/21/18   Forrest Moron, MD     Allergies:   No Known Allergies  Social History:   Social History   Socioeconomic History  . Marital status: Married    Spouse name: Not on file  . Number of children: 0  . Years of education: 58  . Highest education level: Not on file  Occupational History    Employer: RETIRED  Tobacco Use  . Smoking status: Current Every Day Smoker    Packs/day: 0.30    Years: 34.00    Pack years: 10.20    Types: Cigarettes  . Smokeless tobacco: Never Used  . Tobacco comment: Patient states that she would love to quit smoking- trying to quit- has decreased to less than a pack a day   Substance and Sexual Activity  . Alcohol use: Yes    Comment: social  . Drug use: No  . Sexual activity: Not on file  Other Topics Concern  . Not on file  Social History Narrative   Patient lives with her significant other.   Patient is retired.   Patient drinks 2-3 cups of caffeine daily in the winter.   Patient has a college education.   Patient is right-handed.         Social Determinants of Health   Financial Resource Strain:   . Difficulty of Paying Living Expenses:   Food Insecurity:   . Worried About Charity fundraiser in the Last Year:   . Arboriculturist in the Last Year:   Transportation Needs:   . Film/video editor (Medical):   Marland Kitchen Lack of Transportation (Non-Medical):   Physical Activity:   . Days of Exercise per Week:   . Minutes of Exercise per Session:   Stress:   . Feeling of Stress :   Social Connections:   . Frequency of Communication with Friends and Family:   . Frequency of Social Gatherings with Friends and Family:   . Attends Religious Services:   . Active  Member of Clubs or Organizations:   . Attends Archivist Meetings:   Marland Kitchen Marital Status:   Intimate Partner Violence:   . Fear of Current or Ex-Partner:   . Emotionally Abused:   Marland Kitchen Physically Abused:   . Sexually Abused:     Family History:  The patient's family history includes Cancer in her maternal grandmother; Diabetes in her brother, father, maternal aunt, maternal aunt, mother, and another family member; Heart Problems in her maternal grandmother; Heart disease in her  maternal grandmother and sister; Hyperlipidemia in an other family member; Hypertension in her maternal aunt, maternal grandmother, and sister; Kidney failure in her maternal aunt. There is no history of Colon cancer, Colon polyps, Rectal cancer, or Stomach cancer.   The patient She indicated that her mother is deceased. She indicated that her father is deceased. She indicated that her brother is alive. She indicated that the status of her maternal grandmother is unknown. She indicated that both of her maternal aunts are deceased. She indicated that the status of her neg hx is unknown.    ROS:  Please see the history of present illness.  All other ROS reviewed and negative.     Physical Exam/Data:   Vitals:   09/11/19 0648 09/11/19 0649  BP:  (!) 150/77  Pulse:  (!) 45  Resp:  16  Temp:  97.8 F (36.6 C)  TempSrc:  Oral  SpO2:  100%  Weight: 54.4 kg   Height: 5\' 5"  (1.651 m)    No intake or output data in the 24 hours ending 09/11/19 0848 Filed Weights   09/11/19 0648  Weight: 54.4 kg   Body mass index is 19.97 kg/m.  General:  Well nourished, well developed, female in no acute distress HEENT: normal Lymph: no adenopathy Neck:  JVD not elevated Endocrine:  No thryomegaly Vascular: No carotid bruits; 4/4 extremity pulses 2+ bilaterally  Cardiac:  normal S1, S2; RRR; no murmur, no rub or gallop  Lungs:  clear to auscultation bilaterally, no wheezing, rhonchi or rales  Abd: soft, nontender, no  hepatomegaly  Ext: No edema Musculoskeletal:  No deformities, BUE and BLE strength normal and equal Skin: warm and dry  Neuro:  CNs 2-12 intact, no focal abnormalities noted Psych:  Normal affect    EKG:  The ECG was personally reviewed: Sinus rhythm, heart rate 75, Q waves V1-3, no significant change from 08/18/2019 Telemetry: Sinus rhythm, occasional PVCs  Relevant CV Studies:  Lexiscan Myoview: Ordered  ETT: 09/01/2019  Blood pressure demonstrated a normal response to exercise.  There was no ST segment deviation noted during stress.  No T wave inversion was noted during stress.   Poor exercise capacity, the patient was only able to exercise for 1 minute 46 seconds. Frequent PVCs. Consider alternative stress test with imaging.   Laboratory Data:  ChemistryNo results for input(s): NA, K, CL, CO2, GLUCOSE, BUN, CREATININE, CALCIUM, GFRNONAA, GFRAA, ANIONGAP in the last 168 hours.  No results for input(s): PROT, ALBUMIN, AST, ALT, ALKPHOS, BILITOT in the last 168 hours. HematologyNo results for input(s): WBC, RBC, HGB, HCT, MCV, MCH, MCHC, RDW, PLT in the last 168 hours. Cardiac Enzymes  High Sensitivity Troponin:  No results for input(s): TROPONINIHS in the last 720 hours.   BNPNo results for input(s): BNP, PROBNP in the last 168 hours.  DDimer No results for input(s): DDIMER in the last 168 hours. Lipids:  Lab Results  Component Value Date   CHOL 164 06/28/2019   HDL 72 06/28/2019   LDLCALC 81 06/28/2019   LDLDIRECT 81 04/29/2018   TRIG 55 06/28/2019   CHOLHDL 2.3 06/28/2019   INR: No results found for: INR, PROTIME A1c:  Lab Results  Component Value Date   HGBA1C 6.2 (A) 06/28/2019   Thyroid:  Lab Results  Component Value Date   TSH 0.20 (L) 07/01/2019   T4TOTAL 7.3 11/10/2016    Radiology/Studies:  DG Chest 2 View  Result Date: 09/11/2019 CLINICAL DATA:  Monitor head a different "rhy." Additional  history provided: Patient has been wearing a heart monitor  for night sweats, was awakened this morning and told to come to the ED, shortness of breath. EXAM: CHEST - 2 VIEW COMPARISON:  CT chest 03/23/2019 FINDINGS: A device projects over the superior mediastinum, likely reflecting a cardiac monitor given the provided history. Heart size within normal limits. There is no appreciable airspace consolidation. No evidence of pleural effusion or pneumothorax. No acute bony abnormality identified. IMPRESSION: No evidence of acute cardiopulmonary abnormality. Electronically Signed   By: Kellie Simmering DO   On: 09/11/2019 07:46    Assessment and Plan:   1.  VT: -Her TSH has been low in the past, recheck with free T3 and T4 -Check an echo -Discuss ischemic eval with MD -Add beta-blocker  2.  Hypertension: -Continue home meds  Principal Problem:   VT (ventricular tachycardia) (HCC) Active Problems:   HTN (hypertension)  For questions or updates, please contact [facility] Please consult [email or web address] for contact info under Cardiology/STEMI.   Jonetta Speak, PA-C  09/11/2019 8:48 AM   Cardiology Attending  Patient seen and examined. Agree with the findings as noted above. The patient was told to present from home for evaluation of NSVT. She has not had syncope and we do not know her LV function. She was unable to complete an exercise test. She has HTN. Her exam is as noted above.  A/P 1. NSVT - we will obtain a 2D echo. If her EF is good, she can be discharged on a beta blocker, toprol xl 25 mg daily. Outpatient stress test. If her EF is down, less than 45%, then I would recommend inpatient heart cath.  2. HTN - her bp is up a bit. We will follow.  Mikle Bosworth.D.

## 2019-09-11 NOTE — Progress Notes (Signed)
  Echocardiogram 2D Echocardiogram has been performed.  Terry Armstrong 09/11/2019, 11:05 AM

## 2019-09-11 NOTE — ED Triage Notes (Signed)
The pt has been wearing a heart monitor for night sweats  She wa awakened this am and was told to come to the ed she had something on her mmonitor  Sl sob no chest pain

## 2019-09-11 NOTE — ED Notes (Signed)
Patient verbalizes understanding of discharge instructions . Opportunity for questions and answers were provided . Armband removed by staff ,Pt discharged from ED. W/C  offered at D/C  and Declined W/C at D/C and was escorted to lobby by RN.  

## 2019-09-11 NOTE — ED Provider Notes (Signed)
[facility] EMERGENCY DEPARTMENT Provider Note   CSN: [id number] Arrival date & time: 09/11/19  [id number]     History Chief Complaint  Patient presents with  . cardiac    Terry Armstrong is a 74 y.o. female.  Patient is a 74 year old female with a history of diabetes and hyperlipidemia who presents with an episode of ventricular tachycardia.  She recently has been having episodes where she will wake up at night sweaty.  She feels a little anxious during these episodes but does not seem to have any palpitations or dizziness.  During the day she has had some exertional shortness of breath.  She does not report any chest pain or palpitations.  She recently saw Dr. Oval Linsey with cardiology who has done a stress test and placed a 30-day monitor.  She got an alert from the monitoring system that she had an abnormal heart rhythm.  She reportedly had a 9-second episode of nonsustained ventricular tachycardia (21 beats).  She was asymptomatic and slept through it.  Dr. Radford Pax with cardiology was contacted by the monitoring company and the patient was told to present to the emergency department by EMS.        Past Medical History:  Diagnosis Date  . Allergy   . Breast abscess   . Constipation    if drinks alot of water no issues - uses womens stool softener PRN only   . Diabetes mellitus without complication ([place])    pt has no meds- diet controlled only   . Exertional dyspnea 08/18/2019  . Hx of adenomatous colonic polyps 06/19/2017  . Hyperlipidemia   . Palpitations 08/18/2019  . Primary snoring   . VT (ventricular tachycardia) ([place]) 09/11/2019    Patient Active Problem List   Diagnosis Date Noted  . VT (ventricular tachycardia) ([place]) 09/11/2019  . Exertional dyspnea 08/18/2019  . Palpitations 08/18/2019  . Hx of adenomatous colonic polyps 06/19/2017  . Multinodular goiter 03/15/2017  . Centrilobular emphysema ([place]) 11/09/2016  . Periodic limb movement sleep disorder  01/11/2015  . Abnormal TSH 01/11/2015  . Upper airway resistance syndrome 01/11/2015  . Mild obstructive sleep apnea 01/11/2015  . Osteoporosis 01/10/2015  . Non-restorative sleep 05/23/2013  . Primary snoring   . HTN (hypertension) 05/11/2013  . DM (diabetes mellitus) ([place]) 06/04/2011  . Tobacco user 06/04/2011  . Hyperlipidemia LDL goal <70 06/04/2011    Past Surgical History:  Procedure Laterality Date  . ABDOMINAL HYSTERECTOMY  1993  . COLONOSCOPY    . POLYPECTOMY       OB History   No obstetric history on file.     Family History  Problem Relation Age of Onset  . Diabetes Mother   . Diabetes Father   . Diabetes Other   . Hypertension Sister   . Hyperlipidemia Other   . Heart disease Sister        defibrillator  . Kidney failure Maternal Aunt        dialysis  . Diabetes Maternal Aunt   . Hypertension Maternal Aunt   . Diabetes Maternal Aunt   . Heart Problems Maternal Grandmother   . Cancer Maternal Grandmother   . Heart disease Maternal Grandmother   . Hypertension Maternal Grandmother   . Diabetes Brother   . Colon cancer Neg Hx   . Colon polyps Neg Hx   . Rectal cancer Neg Hx   . Stomach cancer Neg Hx     Social History   Tobacco Use  . Smoking status:  Current Every Day Smoker    Packs/day: 0.30    Years: 34.00    Pack years: 10.20    Types: Cigarettes  . Smokeless tobacco: Never Used  . Tobacco comment: Patient states that she would love to quit smoking- trying to quit- has decreased to less than a pack a day   Substance Use Topics  . Alcohol use: Yes    Comment: social  . Drug use: No    Home Medications Prior to Admission medications   Medication Sig Start Date End Date Taking? Authorizing Provider  albuterol (VENTOLIN HFA) 108 (90 Base) MCG/ACT inhaler INHALE 2 PUFFS INTO THE LUNGS  EVERY 6 HOURS AS NEEDED FOR WHEEZING FOR SHORTNESS OF BREATH Patient taking differently: Inhale 2 puffs into the lungs every 6 (six) hours as needed for  wheezing or shortness of breath.  08/15/19  Yes Forrest Moron, MD  aspirin EC 81 MG tablet Take 1 tablet (81 mg total) by mouth daily. 06/28/19  Yes Delia Chimes A, MD  atorvastatin (LIPITOR) 40 MG tablet Take 1 tablet (40 mg total) by mouth daily. 02/14/19  Yes Stallings, Zoe A, MD  fluticasone (FLONASE) 50 MCG/ACT nasal spray Place 2 sprays into both nostrils at bedtime. Patient taking differently: Place 2 sprays into both nostrils daily as needed for allergies.  07/07/19  Yes Forrest Moron, MD  loratadine (CLARITIN) 10 MG tablet Take 10 mg by mouth daily as needed for allergies or rhinitis.    Yes [provider]  losartan (COZAAR) 25 MG tablet Take 1 tablet (25 mg total) by mouth daily. 02/14/19  Yes Forrest Moron, MD  multivitamin-iron-minerals-folic acid (CENTRUM) chewable tablet Chew 1 tablet by mouth daily.   Yes [provider]  Omega-3 Fatty Acids (FISH OIL) 1200 MG CAPS Take 1 capsule (1,200 mg total) by mouth 2 (two) times daily. Patient taking differently: Take 1 capsule by mouth daily.  04/04/16  Yes Nche, Charlene Brooke, NP  alendronate (FOSAMAX) 70 MG tablet Take one tablet once weekly. Take with a full glass of water on an empty stomach. 09/21/18   Forrest Moron, MD  metoprolol succinate (TOPROL-XL) 25 MG 24 hr tablet Take 1 tablet (25 mg total) by mouth daily. 09/12/19   Barrett, Evelene Croon, PA-C    Allergies    Patient has no known allergies.  Review of Systems   Review of Systems  Constitutional: Positive for diaphoresis. Negative for chills, fatigue and fever.  HENT: Negative for congestion, rhinorrhea and sneezing.   Eyes: Negative.   Respiratory: Positive for shortness of breath (Occasional exertional shortness of breath). Negative for cough and chest tightness.   Cardiovascular: Negative for chest pain and leg swelling.  Gastrointestinal: Negative for abdominal pain, blood in stool, diarrhea, nausea and vomiting.  Genitourinary: Negative for  difficulty urinating, flank pain, frequency and hematuria.  Musculoskeletal: Negative for arthralgias and back pain.  Skin: Negative for rash.  Neurological: Negative for dizziness, speech difficulty, weakness, numbness and headaches.    Physical Exam Updated Vital Signs BP 138/74   Pulse 62   Temp 97.8 F (36.6 C) (Oral)   Resp 18   Ht 5\' 5"  (1.651 m)   Wt 54.4 kg   SpO2 99%   BMI 19.97 kg/m   Physical Exam Constitutional:      Appearance: She is well-developed.  HENT:     Head: Normocephalic and atraumatic.  Eyes:     Pupils: Pupils are equal, round, and reactive to light.  Cardiovascular:     Rate and Rhythm: Normal rate and regular rhythm.     Heart sounds: Normal heart sounds.  Pulmonary:     Effort: Pulmonary effort is normal. No respiratory distress.     Breath sounds: Normal breath sounds. No wheezing or rales.  Chest:     Chest wall: No tenderness.  Abdominal:     General: Bowel sounds are normal.     Palpations: Abdomen is soft.     Tenderness: There is no abdominal tenderness. There is no guarding or rebound.  Musculoskeletal:        General: Normal range of motion.     Cervical back: Normal range of motion and neck supple.  Lymphadenopathy:     Cervical: No cervical adenopathy.  Skin:    General: Skin is warm and dry.     Findings: No rash.  Neurological:     Mental Status: She is alert and oriented to person, place, and time.     ED Results / Procedures / Treatments   Labs (all labs ordered are listed, but only abnormal results are displayed) Labs Reviewed  BASIC METABOLIC PANEL - Abnormal; Notable for the following components:      Result Value   Glucose, Bld 112 (*)    All other components within normal limits  TSH - Abnormal; Notable for the following components:   TSH 0.192 (*)    All other components within normal limits  RESPIRATORY PANEL BY RT PCR (FLU A&B, COVID)  CBC  MAGNESIUM  T4, FREE  T3, FREE  TROPONIN I (HIGH SENSITIVITY)      EKG EKG Interpretation  Date/Time:  Sunday Sep 11 2019 06:38:49 EDT Ventricular Rate:  75 PR Interval:  156 QRS Duration: 70 QT Interval:  394 QTC Calculation: 439 R Axis:   55 Text Interpretation: Normal sinus rhythm Anteroseptal infarct , age undetermined Abnormal ECG No old tracing to compare Confirmed by Cornell Gaber ([id number]) on 09/11/2019 7:25:20 AM   Radiology DG Chest 2 View  Result Date: 09/11/2019 CLINICAL DATA:  Monitor head a different "rhy." Additional history provided: Patient has been wearing a heart monitor for night sweats, was awakened this morning and told to come to the ED, shortness of breath. EXAM: CHEST - 2 VIEW COMPARISON:  CT chest 03/23/2019 FINDINGS: A device projects over the superior mediastinum, likely reflecting a cardiac monitor given the provided history. Heart size within normal limits. There is no appreciable airspace consolidation. No evidence of pleural effusion or pneumothorax. No acute bony abnormality identified. IMPRESSION: No evidence of acute cardiopulmonary abnormality. Electronically Signed   By: Kyle  Golden DO   On: 09/11/2019 07:46   ECHOCARDIOGRAM COMPLETE  Result Date: 09/11/2019    ECHOCARDIOGRAM REPORT   Patient Name:   Neziah Kaelin Date of Exam: 09/11/2019 Medical Rec #:  [id number]     Height:       65.0 in Accession #:    [id number]    Weight:       120.0 lb Date of Birth:  12/17/1945    BSA:          1.592 m Patient Age:    73 years      BP:           152/114 mmHg Patient Gender: F             HR:           67  bpm. Exam Location:  Inpatient Procedure: 2D Echo Indications:  Ventricular tachycardia  History:        Patient has no prior history of Echocardiogram examinations.                 Risk Factors:Hypertension and Dyslipidemia.  Sonographer:    Johny Chess Referring Phys: [id number] [place]  1. Left ventricular ejection fraction, by estimation, is 65 to 70%. The left ventricle has normal function. The left  ventricle has no regional wall motion abnormalities. Left ventricular diastolic parameters were normal.  2. Right ventricular systolic function is normal. The right ventricular size is normal.  3. The mitral valve is normal in structure. Trivial mitral valve regurgitation. No evidence of mitral stenosis.  4. The aortic valve is normal in structure. Aortic valve regurgitation is not visualized. No aortic stenosis is present. FINDINGS  Left Ventricle: Left ventricular ejection fraction, by estimation, is 65 to 70%. The left ventricle has normal function. The left ventricle has no regional wall motion abnormalities. The left ventricular internal cavity size was normal in size. There is  no left ventricular hypertrophy. Left ventricular diastolic parameters were normal. Right Ventricle: The right ventricular size is normal. No increase in right ventricular wall thickness. Right ventricular systolic function is normal. Left Atrium: Left atrial size was normal in size. Right Atrium: Right atrial size was normal in size. Pericardium: There is no evidence of pericardial effusion. Mitral Valve: The mitral valve is normal in structure. Trivial mitral valve regurgitation. No evidence of mitral valve stenosis. Tricuspid Valve: The tricuspid valve is normal in structure. Tricuspid valve regurgitation is not demonstrated. No evidence of tricuspid stenosis. Aortic Valve: The aortic valve is normal in structure. Aortic valve regurgitation is not visualized. No aortic stenosis is present. Pulmonic Valve: The pulmonic valve was normal in structure. Pulmonic valve regurgitation is not visualized. No evidence of pulmonic stenosis. Aorta: The aortic root and ascending aorta are structurally normal, with no evidence of dilitation. IAS/Shunts: The atrial septum is grossly normal.  LEFT VENTRICLE PLAX 2D LVIDd:         4.50 cm     Diastology LVIDs:         2.50 cm     LV e' lateral:   8.70 cm/s LV PW:         0.90 cm     LV E/e' lateral:  8.1 LV IVS:        0.80 cm     LV e' medial:    8.16 cm/s LVOT diam:     1.80 cm     LV E/e' medial:  8.6 LV SV:         47 LV SV Index:   29 LVOT Area:     2.54 cm  LV Volumes (MOD) LV vol d, MOD A2C: 58.3 ml LV vol s, MOD A2C: 26.8 ml LV SV MOD A2C:     31.5 ml RIGHT VENTRICLE             IVC RV S prime:     11.00 cm/s  IVC diam: 2.10 cm TAPSE (M-mode): 1.8 cm LEFT ATRIUM             Index       RIGHT ATRIUM          Index LA diam:        2.90 cm 1.82 cm/m  RA Area:     9.65 cm LA Vol (A2C):   40.8 ml 25.63 ml/m RA Volume:   19.30 ml 12.12  ml/m LA Vol (A4C):   32.4 ml 20.35 ml/m LA Biplane Vol: 37.1 ml 23.30 ml/m  AORTIC VALVE LVOT Vmax:   79.40 cm/s LVOT Vmean:  57.600 cm/s LVOT VTI:    0.184 m  AORTA Ao Root diam: 2.70 cm MITRAL VALVE MV Area (PHT): 3.77 cm    SHUNTS MV Decel Time: 201 msec    Systemic VTI:  0.18 m MV E velocity: 70.40 cm/s  Systemic Diam: 1.80 cm MV A velocity: 82.50 cm/s MV E/A ratio:  0.85 Mertie Moores MD Electronically signed by Mertie Moores MD Signature Date/Time: 09/11/2019/11:18:53 AM    Final     Procedures Procedures (including critical care time)  Medications Ordered in ED Medications  sodium chloride flush (NS) 0.9 % injection 3 mL (has no administration in time range)  metoprolol succinate (TOPROL-XL) 24 hr tablet 25 mg (25 mg Oral Given 09/11/19 1001)    ED Course  I have reviewed the triage vital signs and the nursing notes.  Pertinent labs & imaging results that were available during my care of the patient were reviewed by me and considered in my medical decision making (see chart for details).    MDM Rules/Calculators/A&P                      Patient is a 74 year old female who presents after her 30-day event monitor picked up a nonsustained episode of V. tach.  She has been evaluated by cardiology.  They had an echocardiogram performed in the ED which showed a normal EF.  They have cleared her for discharge with outpatient follow-up with her  cardiologist.  They have prescribed her metoprolol.  Her labs are nonconcerning.  Her TSH is slightly low.  Her chest x-ray is clear without evidence of pneumonia or pneumothorax.  She is asymptomatic.  She was discharged per cardiology recommendations. Final Clinical Impression(s) / ED Diagnoses Final diagnoses:  NSVT (nonsustained ventricular tachycardia) ([place])    Rx / DC Orders ED Discharge Orders         Ordered    metoprolol succinate (TOPROL-XL) 25 MG 24 hr tablet  Daily     09/11/19 1201           Malvin Johns, MD 09/11/19 1244

## 2019-09-11 NOTE — Progress Notes (Signed)
   Dr. Lovena Le reviewed the echocardiogram.  Her EF is normal, no significant abnormalities.  She is cleared for discharge from a cardiac standpoint, keep follow-up appointments as scheduled.  Prescription for Toprol-XL 25 mg daily sent to her pharmacy.  Rosaria Ferries, PA-C 09/11/2019 12:03 PM

## 2019-09-11 NOTE — Telephone Encounter (Signed)
[employer] called stating that patient had a 9 sec run of NSVT (21 beats).  Patient was asymptomatic at the time sleeping.  She has been having problems with a funny sensation in her chest that will make her feel very panicked and become diaphoretic.  She has no cardiac hx but her sister has a hx of CHF and has an ICD.  I have informed that patient of the findings and recommended that she come to the ER.  She has no one to drive her there so I have asked her to call EMS as I do not want her driving.  She agrees with the plan and will call EMS to take her to ER for evaluation.

## 2019-09-12 LAB — T3, FREE: T3, Free: 2.8 pg/mL (ref 2.0–4.4)

## 2019-09-13 NOTE — Telephone Encounter (Signed)
Spoke with patient regarding scheduling the [place] ordered by Dr. Mardella Layman states she has already had a stress test and does not want to schedule until she speaks with her cardiologist.  Will send message to [facility]. LPN

## 2019-09-14 NOTE — Telephone Encounter (Signed)
Discussed with Dr Oval Linsey who tried to call patient, she would like patient to proceed with [facility] Advised patient and she is agreeable to plan\ Patient did want for Dr Oval Linsey to review studies done in ED recently Will forward for review

## 2019-09-14 NOTE — Telephone Encounter (Signed)
I did review, including the echo.  Her heart is squeezing well, which is good news.  Unfortunately it doesn't rule out ischemia, which is why we still need the stress.

## 2019-09-15 ENCOUNTER — Other Ambulatory Visit: Payer: Self-pay | Admitting: Physician Assistant

## 2019-09-15 DIAGNOSIS — R9431 Abnormal electrocardiogram [ECG] [EKG]: Secondary | ICD-10-CM

## 2019-09-15 NOTE — Telephone Encounter (Signed)
Advised patient, she will keep Lexi appointment next week

## 2019-09-15 NOTE — Telephone Encounter (Signed)
No answer

## 2019-09-21 ENCOUNTER — Telehealth (HOSPITAL_COMMUNITY): Payer: Self-pay

## 2019-09-21 NOTE — Telephone Encounter (Signed)
Encounter complete. 

## 2019-09-23 ENCOUNTER — Other Ambulatory Visit: Payer: Self-pay

## 2019-09-23 ENCOUNTER — Ambulatory Visit (HOSPITAL_COMMUNITY)
Admission: RE | Admit: 2019-09-23 | Discharge: 2019-09-23 | Disposition: A | Discharge status: Home / Self Care | Payer: PPO | Source: Ambulatory Visit | Attending: Internal Medicine | Admitting: Internal Medicine

## 2019-09-23 DIAGNOSIS — R9431 Abnormal electrocardiogram [ECG] [EKG]: Secondary | ICD-10-CM | POA: Insufficient documentation

## 2019-09-23 LAB — MYOCARDIAL PERFUSION IMAGING
LV dias vol: 91 mL (ref 46–106)
LV sys vol: 39 mL
Peak HR: 110 {beats}/min
Rest HR: 58 {beats}/min
SDS: 2
SRS: 0
SSS: 2
TID: 1.07

## 2019-09-23 MED ORDER — TECHNETIUM TC 99M TETROFOSMIN IV KIT
27.9000 | PACK | Freq: Once | INTRAVENOUS | Status: AC | PRN
  Administered 2019-09-23: 27.9 via INTRAVENOUS
  Filled 2019-09-23: qty 28

## 2019-09-23 MED ORDER — TECHNETIUM TC 99M TETROFOSMIN IV KIT
10.3000 | PACK | Freq: Once | INTRAVENOUS | Status: AC | PRN
  Administered 2019-09-23: 10.3 via INTRAVENOUS
  Filled 2019-09-23: qty 11

## 2019-09-23 MED ORDER — REGADENOSON 0.4 MG/5ML IV SOLN
0.4000 mg | Freq: Once | INTRAVENOUS | Status: AC
Start: 2019-09-23 — End: 2019-09-23
  Administered 2019-09-23: 0.4 mg via INTRAVENOUS

## 2019-09-27 ENCOUNTER — Telehealth: Payer: Self-pay

## 2019-09-27 NOTE — Telephone Encounter (Addendum)
Left a voice message for the patient to give our office a call to go over her recent stress test results. Will try giving the patient another call.  ----- Message from Almyra Deforest, [place] sent at 09/26/2019  7:51 PM EDT ----- Low risk stress test with normal pumping function of heart and no significant reversible blockage. Continue to followup with Dr. Oval Linsey as previous scheduled

## 2019-09-28 NOTE — Telephone Encounter (Signed)
  Patient is returning call regarding test results

## 2019-09-28 NOTE — Telephone Encounter (Signed)
The patient has been notified of the stress test result and verbalized understanding.  All questions (if any) were answered. Frederik Schmidt, RN 09/28/2019 9:50 AM

## 2019-10-04 ENCOUNTER — Other Ambulatory Visit: Payer: Self-pay

## 2019-10-04 ENCOUNTER — Encounter: Payer: Self-pay | Admitting: Cardiovascular Disease

## 2019-10-04 ENCOUNTER — Ambulatory Visit: Payer: PPO | Admitting: Cardiovascular Disease

## 2019-10-04 VITALS — BP 136/84 | HR 74 | Ht 66.0 in | Wt 121.0 lb

## 2019-10-04 DIAGNOSIS — I10 Essential (primary) hypertension: Secondary | ICD-10-CM

## 2019-10-04 DIAGNOSIS — Z720 Tobacco use: Secondary | ICD-10-CM

## 2019-10-04 DIAGNOSIS — I472 Ventricular tachycardia, unspecified: Secondary | ICD-10-CM

## 2019-10-04 DIAGNOSIS — R0602 Shortness of breath: Secondary | ICD-10-CM

## 2019-10-04 NOTE — Progress Notes (Signed)
Cardiology Office Note   Date:  10/04/2019   ID:  Terry Armstrong, DOB Nov 20, 1945, MRN [id number]  PCP:  Terry Moron, MD  Cardiologist:   Terry Latch, MD   No chief complaint on file.   History of Present Illness: Terry Armstrong is a 74 y.o. female with hypertension and prior tobacco abuse who presents for follow-up.  She was initially seen an evaluation of chest discomfort.  She reported three months of intermittent episodes of night sweats.  She feels like her heart is racing and like she needs to belch.  Sometimes belching helps, sometimes it does not.  This of breath.    Of note, her sister has congestive heart failure and has ICD.  She discussed the symptoms with her PCP and she was referred to cardiology for further evaluation.  Dr. Nolon Armstrong also recommended smoking cessation and gave her a prescription of Chantix.  She was referred for an ETT that was negative but poor exercise tolerance.  1 minute and 45 seconds (4.1 METS).  She was referred for [facility].  However prior to this happening she was wearing a 30-day event monitor and was found to have episodes of NSVT.  She was referred to the ED.  In the ED she had an echo that showed no structural abnormalities and a normal systolic function.  She was started on metoprolol and discharged home.  She had an outpatient Myoview 09/2019 that revealed LVEF 57% and no ischemia.  She presents today having felt well.  She has not been having any chest pain lately.  She denies any heart racing or palpitations.  She started taking metoprolol but felt wheezy.  She tried taking it with food but had no improvement.  She now takes it at night and seems to be doing better.  It does make her very tired all asleep very quickly.  She continues to be very stressed.  She is caring for both her husband and her brother.  She does not have any time to exercise.  She wants to walk but does not have the time.  When she does walk she notes that she gets  short of breath easily but has no chest pain.  Past Medical History:  Diagnosis Date   Allergy    Breast abscess    Constipation    if drinks alot of water no issues - uses womens stool softener PRN only    Diabetes mellitus without complication ([place])    pt has no meds- diet controlled only    Exertional dyspnea 08/18/2019   Hx of adenomatous colonic polyps 06/19/2017   Hyperlipidemia    Palpitations 08/18/2019   Primary snoring    VT (ventricular tachycardia) ([place]) 09/11/2019    Past Surgical History:  Procedure Laterality Date   ABDOMINAL HYSTERECTOMY  1993   COLONOSCOPY     POLYPECTOMY       Current Outpatient Medications  Medication Sig Dispense Refill   albuterol (VENTOLIN HFA) 108 (90 Base) MCG/ACT inhaler INHALE 2 PUFFS INTO THE LUNGS  EVERY 6 HOURS AS NEEDED FOR WHEEZING FOR SHORTNESS OF BREATH (Patient taking differently: Inhale 2 puffs into the lungs every 6 (six) hours as needed for wheezing or shortness of breath. ) 18 g 0   alendronate (FOSAMAX) 70 MG tablet Take one tablet once weekly. Take with a full glass of water on an empty stomach. 12 tablet 3   aspirin EC 81 MG tablet Take 1 tablet (81 mg total) by mouth  daily.     atorvastatin (LIPITOR) 40 MG tablet Take 1 tablet (40 mg total) by mouth daily. 90 tablet 1   fluticasone (FLONASE) 50 MCG/ACT nasal spray Place 2 sprays into both nostrils at bedtime. (Patient taking differently: Place 2 sprays into both nostrils daily as needed for allergies. ) 16 g 2   loratadine (CLARITIN) 10 MG tablet Take 10 mg by mouth daily as needed for allergies or rhinitis.      losartan (COZAAR) 25 MG tablet Take 1 tablet (25 mg total) by mouth daily. 90 tablet 1   metoprolol succinate (TOPROL-XL) 25 MG 24 hr tablet Take 25 mg by mouth as needed.     multivitamin-iron-minerals-folic acid (CENTRUM) chewable tablet Chew 1 tablet by mouth daily.     Omega 3 1200 MG CAPS Take by mouth daily.     No current  facility-administered medications for this visit.    Allergies:   Patient has no known allergies.    Social History:  The patient  reports that she has been smoking cigarettes. She has a 10.20 pack-year smoking history. She has never used smokeless tobacco. She reports current alcohol use. She reports that she does not use drugs.   Family History:  The patient's family history includes Cancer in her maternal grandmother; Diabetes in her brother, father, maternal aunt, maternal aunt, mother, and another family member; Heart Problems in her maternal grandmother; Heart disease in her maternal grandmother and sister; Hyperlipidemia in an other family member; Hypertension in her maternal aunt, maternal grandmother, and sister; Kidney failure in her maternal aunt.    ROS:  Please see the history of present illness.   Otherwise, review of systems are positive for none.   All other systems are reviewed and negative.    PHYSICAL EXAM: VS:  BP 136/84    Pulse 74    Ht 5\' 6"  (1.676 m)    Wt 121 lb (54.9 kg)    SpO2 93%    BMI 19.53 kg/m  , BMI Body mass index is 19.53 kg/m. GENERAL:  Well appearing HEENT: Pupils equal round and reactive, fundi not visualized, oral mucosa unremarkable NECK:  No jugular venous distention, waveform within normal limits, carotid upstroke brisk and symmetric, no bruits, no thyromegaly LYMPHATICS:  No cervical adenopathy LUNGS:  Clear to auscultation bilaterally HEART:  RRR.  PMI not displaced or sustained,S1 and S2 within normal limits, no S3, no S4, no clicks, no rubs, no murmurs ABD:  Flat, positive bowel sounds normal in frequency in pitch, no bruits, no rebound, no guarding, no midline pulsatile mass, no hepatomegaly, no splenomegaly EXT:  2 plus pulses throughout, no edema, no cyanosis no clubbing SKIN:  No rashes no nodules NEURO:  Cranial nerves II through XII grossly intact, motor grossly intact throughout PSYCH:  Cognitively intact, oriented to person place and  time   EKG:  EKG is not ordered today. The ekg ordered today demonstrates sinus rhythm.  Rate 75 bpm.  Cannot rule out prior septal infarct.  30 Day Event Monitor 09/23/19: Quality: Fair.  Baseline artifact. Predominant rhythm: Sinus rhythm Average heart rate: 89 bpm Max heart rate: 145 bpm Min heart rate: 56 bpm Pauses >2.5 seconds: None  Frequent PVCs and NSVT noted Ventricular bigeminy and trigeminy  ETT 09/01/19:  Blood pressure demonstrated a normal response to exercise.  There was no ST segment deviation noted during stress.  No T wave inversion was noted during stress.   Poor exercise capacity, the patient was only  able to exercise for 1 minute 46 seconds. Frequent PVCs. Consider alternative stress test with imaging.   Lexiscan Myoview 09/23/19:  The left ventricular ejection fraction is normal (55-65%).  Nuclear stress EF: 57%.  There was no ST segment deviation noted during stress.  The study is normal.  This is a low risk study.  No prior study for comparison.  Echo 09/11/19: 1. Left ventricular ejection fraction, by estimation, is 65 to 70%. The  left ventricle has normal function. The left ventricle has no regional  wall motion abnormalities. Left ventricular diastolic parameters were  normal.  2. Right ventricular systolic function is normal. The right ventricular  size is normal.  3. The mitral valve is normal in structure. Trivial mitral valve  regurgitation. No evidence of mitral stenosis.  4. The aortic valve is normal in structure. Aortic valve regurgitation is  not visualized. No aortic stenosis is present.   Recent Labs: 06/28/2019: ALT 31 09/11/2019: BUN 10; Creatinine, Ser 0.67; Hemoglobin 13.0; Magnesium 1.9; Platelets 255; Potassium 4.4; Sodium 142; TSH 0.192    Lipid Panel    Component Value Date/Time   CHOL 164 06/28/2019 0937   TRIG 55 06/28/2019 0937   HDL 72 06/28/2019 0937   CHOLHDL 2.3 06/28/2019 0937   CHOLHDL 3.7 07/26/2015  0931   VLDL 30 07/26/2015 0931   LDLCALC 81 06/28/2019 0937   LDLDIRECT 81 04/29/2018 1605      Wt Readings from Last 3 Encounters:  10/04/19 121 lb (54.9 kg)  09/23/19 120 lb (54.4 kg)  09/11/19 120 lb (54.4 kg)      ASSESSMENT AND PLAN:  # PVCs: # NSVT:   Monitor showed episodes of PVCs and NSVT.  She is taking metoprolol but does not like To feel.  Structurally her heart seems to be normal and she has no ischemia.  She can take the metoprolol as needed, as she does not like to feel.  She previously had a sleep study that showed mild sleep apnea in 2015 but not enough to require a CPAP.  # Exertional dyspnea: ETT was negative for ischemia but she had poor functional capacity.  Lexiscan was negative for ischemia.  Given her smoking history we will get PFTs to make sure there is no underlying pulmonary disease.  # Tobacco abuse: Encouraged her to use the Chantix and quit smoking.   # Hypertension:   Blood pressure mildly above goal.  We discussed increasing her exercise to 150 minutes a week or trying to get at least 20 minutes of walking and daily.  Continue losartan and metoprolol as needed.  If her blood pressure remains above goal at follow-up, we will need to titrate her antihypertensives.  Current medicines are reviewed at length with the patient today.  The patient does not have concerns regarding medicines.  The following changes have been made:  no change  Labs/ tests ordered today include:   Orders Placed This Encounter  Procedures   Pulmonary function test     Disposition:   FU with Terry Odell C. Oval Linsey, MD, [facility] in 2 months.      Signed, Terry Inocencio C. Oval Linsey, MD, [facility]  10/04/2019 2:31 PM    [facility]

## 2019-10-04 NOTE — Patient Instructions (Signed)
Medication Instructions:  DECREASE METOPROLOL TO AS NEEDED   *If you need a refill on your cardiac medications before your next appointment, please call your pharmacy*  Lab Work: YOU WILL NEED A COVID SCREENING TEST 3 DAYS PRIOR TO YOUR PFT'S ONCE YOU HAVE THIS YOU WILL NEED TO QUARANTINE YOURSELF UNTIL AFTER THE STUDY   Testing/Procedures: Your physician has recommended that you have a pulmonary function test. Pulmonary Function Tests are a group of tests that measure how well air moves in and out of your lungs.  Follow-Up: At [facility], you and your health needs are our priority.  As part of our continuing mission to provide you with exceptional heart care, we have created designated Provider Care Teams.  These Care Teams include your primary Cardiologist (physician) and Advanced Practice Providers (APPs -  Physician Assistants and Nurse Practitioners) who all work together to provide you with the care you need, when you need it.  We recommend signing up for the patient portal called "MyChart".  Sign up information is provided on this After Visit Summary.  MyChart is used to connect with patients for Virtual Visits (Telemedicine).  Patients are able to view lab/test results, encounter notes, upcoming appointments, etc.  Non-urgent messages can be sent to your provider as well.   To learn more about what you can do with MyChart, go to [email or web address].    Your next appointment:   6 month(s)  The format for your next appointment:   In Person  Provider:   You may see Skeet Latch, MD or one of the following Advanced Practice Providers on your designated Care Team:    Kerin Ransom, PA-C  [place], [place]  Coletta Memos, [place]  Other Instructions:  YOU NEED TO TRY TO EXERCISE 20 MINUTES EACH DAY  DECREASE YOUR SODIUM (SALT) INTAKE    Pulmonary Function Tests Pulmonary function tests (PFTs) are used to measure how well your lungs work, find out what is causing  your lung problems, and figure out the best treatment for you. You may have PFTs:  When you have an illness involving the lungs.  To follow changes in your lung function over time if you have a chronic lung disease.  If you are an Nature conservation officer. This checks the effects of being exposed to chemicals over a long period of time.  To check lung function before having surgery or other procedures.  To check your lungs if you smoke.  To check if prescribed medicines or treatments are helping your lungs. Your results will be compared to the expected lung function of someone with healthy lungs who is similar to you in:  Age.  Gender.  Height.  Weight.  Race or ethnicity. This is done to show how your lungs compare to normal lung function (percent predicted). This is how your health care provider knows if your lung function is normal or not. If you have had PFTs done before, your health care provider will compare your current results with past results. This shows if your lung function is better, worse, or the same as before. Tell a health care provider about:  Any allergies you have.  All medicines you are taking, including inhaler or nebulizer medicines, vitamins, herbs, eye drops, creams, and over-the-counter medicines.  Any blood disorders you have.  Any surgeries you have had, especially recent eye surgery, abdominal surgery, or chest surgery. These can make PFTs difficult or unsafe.  Any medical conditions you have, including chest pain or heart problems,  tuberculosis, or respiratory infections such as pneumonia, a cold, or the flu.  Any fear of being in closed spaces (claustrophobia). Some of your tests may be in a closed space. What are the risks? Generally, this is a safe procedure. However, problems may occur, including:  Light-headedness due to over-breathing (hyperventilation).  An asthma attack from deep breathing.  A collapsed lung. What happens before the  procedure?  Take over-the-counter and prescription medicines only as told by your health care provider. If you take inhaler or nebulizer medicines, ask your health care provider which medicines you should take on the day of your testing. Some inhaler medicines may interfere with PFTs if they are taken shortly before the tests.  Follow your health care provider's instructions on eating and drinking restrictions. This may include avoiding eating large meals and drinking alcohol before the testing.  Do not use any products that contain nicotine or tobacco, such as cigarettes and e-cigarettes. If you need help quitting, ask your health care provider.  Wear comfortable clothing that will not interfere with breathing. What happens during the procedure?   You will be given a soft nose clip to wear. This is done so all of your breaths will go through your mouth instead of your nose.  You will be given a germ-free (sterile) mouthpiece. It will be attached to a machine that measures your breathing (spirometer).  You will be asked to do various breathing maneuvers. The maneuvers will be done by breathing in (inhaling) and breathing out (exhaling). You may be asked to repeat the maneuvers several times before the testing is done.  It is important to follow the instructions exactly to get accurate results. Make sure to blow as hard and as fast as you can when you are told to do so.  You may be given a medicine that makes the small air passages in your lungs larger (bronchodilator) after testing has been done. This medicine will make it easier for you to breathe.  The tests will be repeated after the bronchodilator has taken effect.  You will be monitored carefully during the procedure for faintness, dizziness, trouble breathing, or any other problems. The procedure may vary among health care providers and hospitals. What happens after the procedure?  It is up to you to get your test results. Ask your  health care provider, or the department that is doing the tests, when your results will be ready. After you have received your test results, talk with your health care provider about treatment options, if necessary. Summary  Pulmonary function tests (PFTs) are used to measure how well your lungs work, find out what is causing your lung problems, and figure out the best treatment for you.  Wear comfortable clothing that will not interfere with breathing.  It is up to you to get your test results. After you have received them, talk with your health care provider about treatment options, if necessary. This information is not intended to replace advice given to you by your health care provider. Make sure you discuss any questions you have with your health care provider. Document Revised: 04/18/2016 Document Reviewed: 03/13/2016 Elsevier Patient Education  2020 [employer].

## 2019-10-05 ENCOUNTER — Telehealth: Payer: Self-pay | Admitting: Cardiovascular Disease

## 2019-10-05 NOTE — Telephone Encounter (Signed)
Spoke with patient regarding appointment for PFTs scheduled Friday  10/21/19 at [facility]---arrival time is 1:30 pm 1st floor admissions office for a 2:00 pm appointment.  COVID screening scheduled Tuesday 10/18/19 at 8:30 am---[facility] location.  Patient voiced her understanding and I will mail information to her.

## 2019-10-18 ENCOUNTER — Other Ambulatory Visit (HOSPITAL_COMMUNITY)
Admission: RE | Admit: 2019-10-18 | Discharge: 2019-10-18 | Disposition: A | Discharge status: Home / Self Care | Payer: PPO | Source: Ambulatory Visit | Attending: Cardiovascular Disease | Admitting: Cardiovascular Disease

## 2019-10-18 DIAGNOSIS — Z01812 Encounter for preprocedural laboratory examination: Secondary | ICD-10-CM | POA: Diagnosis not present

## 2019-10-18 DIAGNOSIS — Z20822 Contact with and (suspected) exposure to covid-19: Secondary | ICD-10-CM | POA: Insufficient documentation

## 2019-10-18 LAB — SARS CORONAVIRUS 2 (TAT 6-24 HRS): SARS Coronavirus 2: NEGATIVE

## 2019-10-21 ENCOUNTER — Other Ambulatory Visit: Payer: Self-pay

## 2019-10-21 ENCOUNTER — Ambulatory Visit (HOSPITAL_COMMUNITY)
Admission: RE | Admit: 2019-10-21 | Discharge: 2019-10-21 | Disposition: A | Discharge status: Home / Self Care | Payer: PPO | Source: Ambulatory Visit | Attending: Cardiovascular Disease | Admitting: Cardiovascular Disease

## 2019-10-21 DIAGNOSIS — R0602 Shortness of breath: Secondary | ICD-10-CM | POA: Insufficient documentation

## 2019-10-21 LAB — PULMONARY FUNCTION TEST
DL/VA % pred: 57 %
DL/VA: 2.33 ml/min/mmHg/L
DLCO unc % pred: 36 %
DLCO unc: 7.43 ml/min/mmHg
FEF 25-75 Post: 0.6 L/sec
FEF 25-75 Pre: 0.4 L/sec
FEF2575-%Change-Post: 49 %
FEF2575-%Pred-Post: 35 %
FEF2575-%Pred-Pre: 23 %
FEV1-%Change-Post: 13 %
FEV1-%Pred-Post: 59 %
FEV1-%Pred-Pre: 52 %
FEV1-Post: 1.13 L
FEV1-Pre: 1 L
FEV1FVC-%Change-Post: 5 %
FEV1FVC-%Pred-Pre: 74 %
FEV6-%Change-Post: 9 %
FEV6-%Pred-Post: 75 %
FEV6-%Pred-Pre: 68 %
FEV6-Post: 1.79 L
FEV6-Pre: 1.63 L
FEV6FVC-%Change-Post: 2 %
FEV6FVC-%Pred-Post: 98 %
FEV6FVC-%Pred-Pre: 96 %
FVC-%Change-Post: 7 %
FVC-%Pred-Post: 76 %
FVC-%Pred-Pre: 71 %
FVC-Post: 1.88 L
FVC-Pre: 1.75 L
Post FEV1/FVC ratio: 60 %
Post FEV6/FVC ratio: 95 %
Pre FEV1/FVC ratio: 57 %
Pre FEV6/FVC Ratio: 93 %
RV % pred: 143 %
RV: 3.39 L
TLC % pred: 95 %
TLC: 5.14 L

## 2019-10-21 MED ORDER — ALBUTEROL SULFATE (2.5 MG/3ML) 0.083% IN NEBU
2.5000 mg | INHALATION_SOLUTION | Freq: Once | RESPIRATORY_TRACT | Status: AC
  Administered 2019-10-21: 2.5 mg via RESPIRATORY_TRACT

## 2019-12-07 ENCOUNTER — Telehealth: Payer: Self-pay | Admitting: *Deleted

## 2019-12-07 DIAGNOSIS — R0602 Shortness of breath: Secondary | ICD-10-CM

## 2019-12-07 DIAGNOSIS — R942 Abnormal results of pulmonary function studies: Secondary | ICD-10-CM

## 2019-12-07 NOTE — Telephone Encounter (Signed)
-----   Message from Skeet Latch, MD sent at 12/05/2019  5:38 PM EDT ----- Lung function is abnormal.  Recommend that she be referred to pulmonary to discuss and see if there is anything done to help her breathing.  Continue to suggest smoking cessation.

## 2019-12-07 NOTE — Telephone Encounter (Signed)
Advised patient of results and referral place Advised to call back if she does not hear from Pulmonary by end of next week

## 2019-12-08 ENCOUNTER — Telehealth: Payer: Self-pay | Admitting: Cardiovascular Disease

## 2019-12-08 NOTE — Telephone Encounter (Signed)
Spoke with patient regarding appointment with [facility] (Dr. Silas Flood scheduled Monday 01/16/20 at 4:00pm---arrival time is 3:45pm [address] , [address]--phone [phone].  Will mail information to patient and she voiced her understanding.

## 2019-12-28 ENCOUNTER — Ambulatory Visit: Payer: PPO | Admitting: Internal Medicine

## 2020-01-16 ENCOUNTER — Encounter: Payer: Self-pay | Admitting: Pulmonary Disease

## 2020-01-16 ENCOUNTER — Other Ambulatory Visit: Payer: Self-pay

## 2020-01-16 ENCOUNTER — Ambulatory Visit: Payer: PPO | Admitting: Pulmonary Disease

## 2020-01-16 VITALS — BP 126/74 | HR 82 | Temp 96.4°F | Ht 63.5 in | Wt 114.8 lb

## 2020-01-16 DIAGNOSIS — J439 Emphysema, unspecified: Secondary | ICD-10-CM

## 2020-01-16 MED ORDER — TIOTROPIUM BROMIDE MONOHYDRATE 18 MCG IN CAPS: 18.0000 ug | ORAL_CAPSULE | Freq: Every day | RESPIRATORY_TRACT | 6 refills | Status: DC

## 2020-01-16 NOTE — Patient Instructions (Addendum)
Nice to meet you!  Use Spiriva 1 puff once a day for the next month. See if this helps your symptoms of shortness of breath with activity. If so, please continue this every day.   Send me a message in a month or two and let me know if it is helping.  We will see you back in 6 months. Call and make an appointment if you need to see us sooner! 

## 2020-01-20 ENCOUNTER — Ambulatory Visit (INDEPENDENT_AMBULATORY_CARE_PROVIDER_SITE_OTHER): Payer: PPO | Admitting: Registered Nurse

## 2020-01-20 ENCOUNTER — Other Ambulatory Visit: Payer: Self-pay

## 2020-01-20 ENCOUNTER — Encounter: Payer: Self-pay | Admitting: Registered Nurse

## 2020-01-20 VITALS — BP 129/67 | HR 74 | Temp 97.3°F | Resp 17 | Ht 63.5 in | Wt 114.4 lb

## 2020-01-20 DIAGNOSIS — E1165 Type 2 diabetes mellitus with hyperglycemia: Secondary | ICD-10-CM

## 2020-01-20 DIAGNOSIS — E785 Hyperlipidemia, unspecified: Secondary | ICD-10-CM | POA: Diagnosis not present

## 2020-01-20 LAB — POCT GLYCOSYLATED HEMOGLOBIN (HGB A1C): Hemoglobin A1C: 5.9 % — AB (ref 4.0–5.6)

## 2020-01-20 MED ORDER — ATORVASTATIN CALCIUM 40 MG PO TABS: 40.0000 mg | ORAL_TABLET | Freq: Every day | ORAL | 1 refills | Status: DC

## 2020-01-20 NOTE — Progress Notes (Signed)
Established Patient Office Visit  Subjective:  Patient ID: Terry Armstrong, female    DOB: 07/23/1945  Age: 74 y.o. MRN: [id number]  CC:  Chief Complaint  Patient presents with   Medication Refill    medication refill     HPI Eduardo Wurth presents for med refill and labs  Hx of t2dm controlled by diet and exercise. Last a1c in feb 2021 was 6.2. no major changes to diet or exercise since. Does not check home sugars. Feeling well   HLD: has run out of atorvastatin for some time - concerned her lipids will go up. In the past they have been well controlled. Will plan to maintain current atorvastatin dose unless drastic change found.  Otherwise, notes she is making progress on quitting smoking - down to 8 cigarettes daily. Her and her husband are supporting each other in this.  No further concerns.  Past Medical History:  Diagnosis Date   Allergy    Breast abscess    Constipation    if drinks alot of water no issues - uses womens stool softener PRN only    Diabetes mellitus without complication ([place])    pt has no meds- diet controlled only    Exertional dyspnea 08/18/2019   Hx of adenomatous colonic polyps 06/19/2017   Hyperlipidemia    Palpitations 08/18/2019   Primary snoring    VT (ventricular tachycardia) ([place]) 09/11/2019    Past Surgical History:  Procedure Laterality Date   ABDOMINAL HYSTERECTOMY  1993   COLONOSCOPY     POLYPECTOMY      Family History  Problem Relation Age of Onset   Diabetes Mother    Diabetes Father    Diabetes Other    Hypertension Sister    Hyperlipidemia Other    Heart disease Sister        defibrillator   Kidney failure Maternal Aunt        dialysis   Diabetes Maternal Aunt    Hypertension Maternal Aunt    Diabetes Maternal Aunt    Heart Problems Maternal Grandmother    Cancer Maternal Grandmother    Heart disease Maternal Grandmother    Hypertension Maternal Grandmother    Diabetes Brother    Colon  cancer Neg Hx    Colon polyps Neg Hx    Rectal cancer Neg Hx    Stomach cancer Neg Hx     Social History   Socioeconomic History   Marital status: Married    Spouse name: Not on file   Number of children: 0   Years of education: 14   Highest education level: Not on file  Occupational History    Employer: RETIRED  Tobacco Use   Smoking status: Current Every Day Smoker    Packs/day: 1.50    Years: 49.00    Pack years: 73.50    Types: Cigarettes   Smokeless tobacco: Never Used   Tobacco comment: 1/2 ppd 01/16/20  Vaping Use   Vaping Use: Never used  Substance and Sexual Activity   Alcohol use: Yes    Comment: social   Drug use: No   Sexual activity: Not on file  Other Topics Concern   Not on file  Social History Narrative   Patient lives with her significant other.   Patient is retired.   Patient drinks 2-3 cups of caffeine daily in the winter.   Patient has a college education.   Patient is right-handed.         Social Determinants of  Health   Financial Resource Strain:    Difficulty of Paying Living Expenses: Not on file  Food Insecurity:    Worried About [employer] in the Last Year: Not on file   Ran Out of Food in the Last Year: Not on file  Transportation Needs:    Lack of Transportation (Medical): Not on file   Lack of Transportation (Non-Medical): Not on file  Physical Activity:    Days of Exercise per Week: Not on file   Minutes of Exercise per Session: Not on file  Stress:    Feeling of Stress : Not on file  Social Connections:    Frequency of Communication with Friends and Family: Not on file   Frequency of Social Gatherings with Friends and Family: Not on file   Attends Religious Services: Not on file   Active Member of [place] or Organizations: Not on file   Attends Archivist Meetings: Not on file   Marital Status: Not on file  Intimate Partner Violence:    Fear of Current or Ex-Partner: Not on  file   Emotionally Abused: Not on file   Physically Abused: Not on file   Sexually Abused: Not on file    Outpatient Medications Prior to Visit  Medication Sig Dispense Refill   albuterol (VENTOLIN HFA) 108 (90 Base) MCG/ACT inhaler INHALE 2 PUFFS INTO THE LUNGS  EVERY 6 HOURS AS NEEDED FOR WHEEZING FOR SHORTNESS OF BREATH (Patient taking differently: Inhale 2 puffs into the lungs every 6 (six) hours as needed for wheezing or shortness of breath. ) 18 g 0   alendronate (FOSAMAX) 70 MG tablet Take one tablet once weekly. Take with a full glass of water on an empty stomach. 12 tablet 3   aspirin EC 81 MG tablet Take 1 tablet (81 mg total) by mouth daily.     fluticasone (FLONASE) 50 MCG/ACT nasal spray Place 2 sprays into both nostrils at bedtime. (Patient taking differently: Place 2 sprays into both nostrils daily as needed for allergies. ) 16 g 2   losartan (COZAAR) 25 MG tablet Take 1 tablet (25 mg total) by mouth daily. 90 tablet 1   metoprolol succinate (TOPROL-XL) 25 MG 24 hr tablet Take 25 mg by mouth as needed.     multivitamin-iron-minerals-folic acid (CENTRUM) chewable tablet Chew 1 tablet by mouth daily.     Omega 3 1200 MG CAPS Take by mouth daily.     tiotropium (SPIRIVA) 18 MCG inhalation capsule Place 1 capsule (18 mcg total) into inhaler and inhale daily. 30 capsule 6   No facility-administered medications prior to visit.    No Known Allergies  ROS Review of Systems  Constitutional: Negative.   HENT: Negative.   Eyes: Negative.   Respiratory: Negative.   Cardiovascular: Negative.   Gastrointestinal: Negative.   Genitourinary: Negative.   Musculoskeletal: Negative.   Skin: Negative.   Neurological: Negative.   Psychiatric/Behavioral: Negative.   All other systems reviewed and are negative.     Objective:    Physical Exam Vitals and nursing note reviewed.  Constitutional:      General: She is not in acute distress.    Appearance: Normal  appearance. She is normal weight. She is not ill-appearing, toxic-appearing or diaphoretic.  Cardiovascular:     Rate and Rhythm: Normal rate and regular rhythm.     Heart sounds: Normal heart sounds. No murmur heard.  No friction rub. No gallop.   Pulmonary:     Effort: Pulmonary  effort is normal. No respiratory distress.     Breath sounds: Normal breath sounds. No stridor. No wheezing, rhonchi or rales.  Chest:     Chest wall: No tenderness.  Skin:    General: Skin is warm and dry.  Neurological:     General: No focal deficit present.     Mental Status: She is alert and oriented to person, place, and time. Mental status is at baseline.  Psychiatric:        Mood and Affect: Mood normal.        Behavior: Behavior normal.        Thought Content: Thought content normal.        Judgment: Judgment normal.     BP 129/67    Pulse 74    Temp (!) 97.3 F (36.3 C) (Temporal)    Resp 17    Ht 5' 3.5" (1.613 m)    Wt 114 lb 6.4 oz (51.9 kg)    SpO2 100%    BMI 19.95 kg/m  Wt Readings from Last 3 Encounters:  01/20/20 114 lb 6.4 oz (51.9 kg)  01/16/20 114 lb 12.8 oz (52.1 kg)  10/04/19 121 lb (54.9 kg)     Health Maintenance Due  Topic Date Due   HEMOGLOBIN A1C  12/26/2019    There are no preventive care reminders to display for this patient.  Lab Results  Component Value Date   TSH 0.192 (L) 09/11/2019   Lab Results  Component Value Date   WBC 7.4 09/11/2019   HGB 13.0 09/11/2019   HCT 39.2 09/11/2019   MCV 97.5 09/11/2019   PLT 255 09/11/2019   Lab Results  Component Value Date   NA 142 09/11/2019   K 4.4 09/11/2019   CO2 26 09/11/2019   GLUCOSE 112 (H) 09/11/2019   BUN 10 09/11/2019   CREATININE 0.67 09/11/2019   BILITOT 0.5 06/28/2019   ALKPHOS 76 06/28/2019   AST 30 06/28/2019   ALT 31 06/28/2019   PROT 6.6 06/28/2019   ALBUMIN 4.3 06/28/2019   CALCIUM 9.1 09/11/2019   ANIONGAP 6 09/11/2019   Lab Results  Component Value Date   CHOL 164 06/28/2019    Lab Results  Component Value Date   HDL 72 06/28/2019   Lab Results  Component Value Date   LDLCALC 81 06/28/2019   Lab Results  Component Value Date   TRIG 55 06/28/2019   Lab Results  Component Value Date   CHOLHDL 2.3 06/28/2019   Lab Results  Component Value Date   HGBA1C 6.2 (A) 06/28/2019      Assessment & Plan:   Problem List Items Addressed This Visit      Endocrine   DM (diabetes mellitus) ([place]) - Primary   Relevant Medications   atorvastatin (LIPITOR) 40 MG tablet   Other Relevant Orders   POCT glycosylated hemoglobin (Hb A1C)     Other   Hyperlipidemia LDL goal <70   Relevant Medications   atorvastatin (LIPITOR) 40 MG tablet   Other Relevant Orders   Lipid panel      Meds ordered this encounter  Medications   atorvastatin (LIPITOR) 40 MG tablet    Sig: Take 1 tablet (40 mg total) by mouth daily.    Dispense:  90 tablet    Refill:  1    Follow-up: No follow-ups on file.   PLAN  Labs collected, will follow up as warranted  Refill atorvastatin  Return in 6 months for med check  Patient encouraged to call clinic with any questions, comments, or concerns.  Maximiano Coss, NP

## 2020-01-20 NOTE — Patient Instructions (Signed)
° ° ° °  If you have lab work done today you will be contacted with your lab results within the next 2 weeks.  If you have not heard from us then please contact us. The fastest way to get your results is to register for My Chart. ° ° °IF you received an x-ray today, you will receive an invoice from [facility]. Please contact [facility] at [phone] with questions or concerns regarding your invoice.  ° °IF you received labwork today, you will receive an invoice from [facility]. Please contact [facility] at [phone] with questions or concerns regarding your invoice.  ° °Our billing staff will not be able to assist you with questions regarding bills from these companies. ° °You will be contacted with the lab results as soon as they are available. The fastest way to get your results is to activate your My Chart account. Instructions are located on the last page of this paperwork. If you have not heard from us regarding the results in 2 weeks, please contact this office. °  ° ° ° °

## 2020-01-21 LAB — LIPID PANEL
Chol/HDL Ratio: 3.1 ratio (ref 0.0–4.4)
Cholesterol, Total: 180 mg/dL (ref 100–199)
HDL: 59 mg/dL (ref >39)
LDL Chol Calc (NIH): 101 mg/dL — ABNORMAL HIGH (ref 0–99)
Triglycerides: 111 mg/dL (ref 0–149)
VLDL Cholesterol Cal: 20 mg/dL (ref 5–40)

## 2020-04-06 ENCOUNTER — Inpatient Hospital Stay: Admission: RE | Admit: 2020-04-06 | Payer: PPO | Source: Ambulatory Visit

## 2020-05-03 ENCOUNTER — Encounter: Payer: PPO | Admitting: Family Medicine

## 2020-05-07 ENCOUNTER — Inpatient Hospital Stay: Admission: RE | Admit: 2020-05-07 | Payer: PPO | Source: Ambulatory Visit

## 2020-05-16 ENCOUNTER — Ambulatory Visit (INDEPENDENT_AMBULATORY_CARE_PROVIDER_SITE_OTHER)
Admission: RE | Admit: 2020-05-16 | Discharge: 2020-05-16 | Disposition: A | Discharge status: Home / Self Care | Payer: PPO | Source: Ambulatory Visit | Attending: Acute Care | Admitting: Acute Care

## 2020-05-16 ENCOUNTER — Other Ambulatory Visit: Payer: Self-pay

## 2020-05-16 DIAGNOSIS — Z87891 Personal history of nicotine dependence: Secondary | ICD-10-CM | POA: Diagnosis not present

## 2020-05-16 DIAGNOSIS — F17210 Nicotine dependence, cigarettes, uncomplicated: Secondary | ICD-10-CM

## 2020-05-16 DIAGNOSIS — Z122 Encounter for screening for malignant neoplasm of respiratory organs: Secondary | ICD-10-CM

## 2020-05-17 NOTE — Progress Notes (Signed)
Please call patient and let them  know their  low dose Ct was read as a Lung RADS 2: nodules that are benign in appearance and behavior with a very low likelihood of becoming a clinically active cancer due to size or lack of growth. Recommendation per radiology is for a repeat LDCT in 12 months. .Please let them  know we will order and schedule their  annual screening scan for 05/2021. Please let them  know there was notation of CAD on their  scan.  Please remind the patient  that this is a non-gated exam therefore degree or severity of disease  cannot be determined. Please have them  follow up with their PCP regarding potential risk factor modification, dietary therapy or pharmacologic therapy if clinically indicated. Pt.  is  currently on statin therapy. Please place order for annual  screening scan for  05/2021 and fax results to PCP. Thanks so much. Pt does have 2 vessel CAD, and aortic atherosclerosis per CT. She is followed closely by cards.

## 2020-05-24 ENCOUNTER — Other Ambulatory Visit: Payer: Self-pay | Admitting: *Deleted

## 2020-05-24 DIAGNOSIS — F17210 Nicotine dependence, cigarettes, uncomplicated: Secondary | ICD-10-CM

## 2020-05-24 DIAGNOSIS — Z87891 Personal history of nicotine dependence: Secondary | ICD-10-CM

## 2020-06-05 NOTE — Telephone Encounter (Signed)
Error

## 2020-06-08 ENCOUNTER — Ambulatory Visit: Payer: PPO | Admitting: Family

## 2020-06-19 ENCOUNTER — Other Ambulatory Visit: Payer: Self-pay

## 2020-06-19 ENCOUNTER — Encounter: Payer: Self-pay | Admitting: Family

## 2020-06-19 ENCOUNTER — Ambulatory Visit: Payer: PPO | Admitting: Family

## 2020-06-19 VITALS — BP 160/92 | HR 80 | Temp 97.3°F | Resp 16 | Ht 63.5 in | Wt 116.3 lb

## 2020-06-19 DIAGNOSIS — J432 Centrilobular emphysema: Secondary | ICD-10-CM

## 2020-06-19 DIAGNOSIS — H6121 Impacted cerumen, right ear: Secondary | ICD-10-CM

## 2020-06-19 DIAGNOSIS — I700 Atherosclerosis of aorta: Secondary | ICD-10-CM

## 2020-06-19 DIAGNOSIS — F17209 Nicotine dependence, unspecified, with unspecified nicotine-induced disorders: Secondary | ICD-10-CM

## 2020-06-19 DIAGNOSIS — M810 Age-related osteoporosis without current pathological fracture: Secondary | ICD-10-CM | POA: Diagnosis not present

## 2020-06-19 DIAGNOSIS — E042 Nontoxic multinodular goiter: Secondary | ICD-10-CM

## 2020-06-19 DIAGNOSIS — E785 Hyperlipidemia, unspecified: Secondary | ICD-10-CM

## 2020-06-19 DIAGNOSIS — E119 Type 2 diabetes mellitus without complications: Secondary | ICD-10-CM

## 2020-06-19 DIAGNOSIS — Z1231 Encounter for screening mammogram for malignant neoplasm of breast: Secondary | ICD-10-CM

## 2020-06-19 DIAGNOSIS — I10 Essential (primary) hypertension: Secondary | ICD-10-CM

## 2020-06-19 DIAGNOSIS — Z720 Tobacco use: Secondary | ICD-10-CM | POA: Diagnosis not present

## 2020-06-19 MED ORDER — DEBROX 6.5 % OT SOLN: 5.0000 [drp] | Freq: Two times a day (BID) | OTIC | 0 refills | Status: DC

## 2020-06-19 MED ORDER — BUPROPION HCL ER (XL) 150 MG PO TB24: 150.0000 mg | ORAL_TABLET | Freq: Every day | ORAL | 0 refills | Status: DC

## 2020-06-19 MED ORDER — ALBUTEROL SULFATE HFA 108 (90 BASE) MCG/ACT IN AERS: INHALATION_SPRAY | RESPIRATORY_TRACT | 0 refills | Status: DC

## 2020-06-19 MED ORDER — ALENDRONATE SODIUM 70 MG PO TABS: ORAL_TABLET | ORAL | 3 refills | Status: AC

## 2020-06-19 MED ORDER — LOSARTAN POTASSIUM 25 MG PO TABS: 25.0000 mg | ORAL_TABLET | Freq: Every day | ORAL | 1 refills | Status: DC

## 2020-06-19 NOTE — Patient Instructions (Signed)
- Debrox 6.5 % otic solution instill 5 drops into right twice daily x 4 days then follow up for ear lavage. - Cut down on smoking  - Increase water intake to 6 - 8 glasses daily  - cut down on cookies and drinking soda.  [email or web address]">  DASH Eating Plan DASH stands for Dietary Approaches to Stop Hypertension. The DASH eating plan is a healthy eating plan that has been shown to:  Reduce high blood pressure (hypertension).  Reduce your risk for type 2 diabetes, heart disease, and stroke.  Help with weight loss. What are tips for following this plan? Reading food labels  Check food labels for the amount of salt (sodium) per serving. Choose foods with less than 5 percent of the Daily Value of sodium. Generally, foods with less than 300 milligrams (mg) of sodium per serving fit into this eating plan.  To find whole grains, look for the word "whole" as the first word in the ingredient list. Shopping  Buy products labeled as "low-sodium" or "no salt added."  Buy fresh foods. Avoid canned foods and pre-made or frozen meals. Cooking  Avoid adding salt when cooking. Use salt-free seasonings or herbs instead of table salt or sea salt. Check with your health care provider or pharmacist before using salt substitutes.  Do not fry foods. Cook foods using healthy methods such as baking, boiling, grilling, roasting, and broiling instead.  Cook with heart-healthy oils, such as olive, canola, avocado, soybean, or sunflower oil. Meal planning  Eat a balanced diet that includes: ? 4 or more servings of fruits and 4 or more servings of vegetables each day. Try to fill one-half of your plate with fruits and vegetables. ? 6-8 servings of whole grains each day. ? Less than 6 oz (170 g) of lean meat, poultry, or fish each day. A 3-oz (85-g) serving of meat is about the same size as a deck of cards. One egg equals 1 oz (28 g). ? 2-3 servings of low-fat  dairy each day. One serving is 1 cup (237 mL). ? 1 serving of nuts, seeds, or beans 5 times each week. ? 2-3 servings of heart-healthy fats. Healthy fats called omega-3 fatty acids are found in foods such as walnuts, flaxseeds, fortified milks, and eggs. These fats are also found in cold-water fish, such as sardines, salmon, and mackerel.  Limit how much you eat of: ? Canned or prepackaged foods. ? Food that is high in trans fat, such as some fried foods. ? Food that is high in saturated fat, such as fatty meat. ? Desserts and other sweets, sugary drinks, and other foods with added sugar. ? Full-fat dairy products.  Do not salt foods before eating.  Do not eat more than 4 egg yolks a week.  Try to eat at least 2 vegetarian meals a week.  Eat more home-cooked food and less restaurant, buffet, and fast food.   Lifestyle  When eating at a restaurant, ask that your food be prepared with less salt or no salt, if possible.  If you drink alcohol: ? Limit how much you use to:  0-1 drink a day for women who are not pregnant.  0-2 drinks a day for men. ? Be aware of how much alcohol is in your drink. In the [place] one drink equals one 12 oz bottle of beer (355 mL), one 5 oz glass of wine (148 mL), or one 1 oz glass of hard liquor (44 mL). General information  Avoid eating more  than 2,300 mg of salt a day. If you have hypertension, you may need to reduce your sodium intake to 1,500 mg a day.  Work with your health care provider to maintain a healthy body weight or to lose weight. Ask what an ideal weight is for you.  Get at least 30 minutes of exercise that causes your heart to beat faster (aerobic exercise) most days of the week. Activities may include walking, swimming, or biking.  Work with your health care provider or dietitian to adjust your eating plan to your individual calorie needs. What foods should I eat? Fruits All fresh, dried, or frozen fruit. Canned fruit in natural juice  (without added sugar). Vegetables Fresh or frozen vegetables (raw, steamed, roasted, or grilled). Low-sodium or reduced-sodium tomato and vegetable juice. Low-sodium or reduced-sodium tomato sauce and tomato paste. Low-sodium or reduced-sodium canned vegetables. Grains Whole-grain or whole-wheat bread. Whole-grain or whole-wheat pasta. Brown rice. [place]. Bulgur. Whole-grain and low-sodium cereals. Pita bread. Low-fat, low-sodium crackers. Whole-wheat flour tortillas. Meats and other proteins Skinless chicken or [place]. Ground chicken or [place]. Pork with fat trimmed off. Fish and seafood. Egg whites. Dried beans, peas, or lentils. Unsalted nuts, nut butters, and seeds. Unsalted canned beans. Lean cuts of beef with fat trimmed off. Low-sodium, lean precooked or cured meat, such as sausages or meat loaves. Dairy Low-fat (1%) or fat-free (skim) milk. Reduced-fat, low-fat, or fat-free cheeses. Nonfat, low-sodium ricotta or cottage cheese. Low-fat or nonfat yogurt. Low-fat, low-sodium cheese. Fats and oils Soft margarine without trans fats. Vegetable oil. Reduced-fat, low-fat, or light mayonnaise and salad dressings (reduced-sodium). Canola, safflower, olive, avocado, soybean, and sunflower oils. Avocado. Seasonings and condiments Herbs. Spices. Seasoning mixes without salt. Other foods Unsalted popcorn and pretzels. Fat-free sweets. The items listed above may not be a complete list of foods and beverages you can eat. Contact a dietitian for more information. What foods should I avoid? Fruits Canned fruit in a light or heavy syrup. Fried fruit. Fruit in cream or butter sauce. Vegetables Creamed or fried vegetables. Vegetables in a cheese sauce. Regular canned vegetables (not low-sodium or reduced-sodium). Regular canned tomato sauce and paste (not low-sodium or reduced-sodium). Regular tomato and vegetable juice (not low-sodium or reduced-sodium). Angie Fava. Olives. Grains Baked goods made  with fat, such as croissants, muffins, or some breads. Dry pasta or rice meal packs. Meats and other proteins Fatty cuts of meat. Ribs. Fried meat. Berniece Salines. Bologna, salami, and other precooked or cured meats, such as sausages or meat loaves. Fat from the back of a pig (fatback). Bratwurst. Salted nuts and seeds. Canned beans with added salt. Canned or smoked fish. Whole eggs or egg yolks. Chicken or [place] with skin. Dairy Whole or 2% milk, cream, and half-and-half. Whole or full-fat cream cheese. Whole-fat or sweetened yogurt. Full-fat cheese. Nondairy creamers. Whipped toppings. Processed cheese and cheese spreads. Fats and oils Butter. Stick margarine. Lard. Shortening. Ghee. Bacon fat. Tropical oils, such as coconut, palm kernel, or palm oil. Seasonings and condiments Onion salt, garlic salt, seasoned salt, table salt, and sea salt. Worcestershire sauce. Tartar sauce. Barbecue sauce. Teriyaki sauce. Soy sauce, including reduced-sodium. Steak sauce. Canned and packaged gravies. Fish sauce. Oyster sauce. Cocktail sauce. Store-bought horseradish. Ketchup. Mustard. Meat flavorings and tenderizers. Bouillon cubes. Hot sauces. Pre-made or packaged marinades. Pre-made or packaged taco seasonings. Relishes. Regular salad dressings. Other foods Salted popcorn and pretzels. The items listed above may not be a complete list of foods and beverages you should avoid. Contact a dietitian for  more information. Where to find more information  National Heart, Lung, and Blood Institute: https://wilson-eaton.com/  American Heart Association: www.heart.org  Academy of Nutrition and Dietetics: www.eatright.[place]: www.kidney.org Summary  The DASH eating plan is a healthy eating plan that has been shown to reduce high blood pressure (hypertension). It may also reduce your risk for type 2 diabetes, heart disease, and stroke.  When on the DASH eating plan, aim to eat more fresh fruits and  vegetables, whole grains, lean proteins, low-fat dairy, and heart-healthy fats.  With the DASH eating plan, you should limit salt (sodium) intake to 2,300 mg a day. If you have hypertension, you may need to reduce your sodium intake to 1,500 mg a day.  Work with your health care provider or dietitian to adjust your eating plan to your individual calorie needs. This information is not intended to replace advice given to you by your health care provider. Make sure you discuss any questions you have with your health care provider. Document Revised: 03/25/2019 Document Reviewed: 03/25/2019 Elsevier Patient Education  2021 [employer].

## 2020-06-19 NOTE — Progress Notes (Addendum)
Provider: Marlowe Sax FNP-C   Patient, No Pcp Per  Patient Care Team: Patient, No Pcp Per as PCP - General (General Practice) Terry Knapp, MD as PCP - Family Medicine (Family Medicine) Terry Latch, MD as PCP - Cardiology (Cardiology) Terry Jacks, MD as Consulting Physician (Ophthalmology)  Extended Emergency Contact Information Primary Emergency Contact: [facility] Address: [address]          [place], [place] [address] [facility] of [place] Phone: [phone] Mobile Phone: [phone] Relation: Spouse Secondary Emergency Contact: [facility] [place] of [place] Phone: [phone] Relation: Relative  Code Status: Full Code  Goals of care: Advanced Directive information Advanced Directives 06/19/2020  Does Patient Have a Medical Advance Directive? No  Type of Advance Directive -  Does patient want to make changes to medical advance directive? -  Would patient like information on creating a medical advance directive? No - Patient declined     Chief Complaint  Patient presents with  . Establish Care    New Patient.    HPI:  Pt is a 75 y.o. female seen today to [facility] care at [facility] for medical management of chronic diseases.she was previously following up with PCP at [place] [facility] will Terry Coss, NP last seen 01/20/2020.she has a medical history of Hypertension,Di      Hypertension - run out Losartan for the past two months.takes metoprolol succinate 25 mg tablet daily   Exertional Dyspnea - no shortness of breath when going up and down the stairs.On Albuterol and Spiriva.     Tobacco abuse - smokes 2 cigarettes per day.smoking ceassation discussed would like to quit smoking.   Hyperlipidemia - includes veggies in diet.eats meat beefy,chicken,pork,sauges.on atorvastatin 40 mg tablet daily.   Constipation - straining and hard stool.Not drinking enough water.No bleeding noted.   Osteoporosis -  has been skipping Fosamax sometimes instead of weekly.    She due for mammogram and Bone density.  Multinodular goiter - on chart review,Had a lung cancer screening CT scan 09/03/2016 and 05/06/2020 which showed multinodular goiter asymmetrically enlarged on the left with dominant hypodense 1.6 cm on left thyroid nodule. She was seen by [facility] at [facility] 07/01/2019 for Thyrotoxicosis without thyroid storm.she was asymptomatic thyroid uptake and scan and advised to follow up in 6 months.     Past Medical History:  Diagnosis Date  . Allergy   . Breast abscess   . Constipation    if drinks alot of water no issues - uses womens stool softener PRN only   . Diabetes mellitus without complication ([place])    pt has no meds- diet controlled only   . Exertional dyspnea 08/18/2019  . H/O mammogram 05/06/2019   Per [place] new patient packet  . Hx of adenomatous colonic polyps 06/19/2017  . Hypercholesteremia    Per [place] new patient packet  . Hyperlipidemia   . Hypertension    Per [place] new patient packet  . Palpitations 08/18/2019  . Primary snoring   . Thyroid condition    Per [place] new patient packet  . VT (ventricular tachycardia) ([place]) 09/11/2019   Past Surgical History:  Procedure Laterality Date  . ABDOMINAL HYSTERECTOMY  1993  . COLONOSCOPY    . POLYPECTOMY      No Known Allergies  Allergies as of 06/19/2020   No Known Allergies     Medication List       Accurate as of June 19, 2020  1:34  PM. If you have any questions, ask your nurse or doctor.        albuterol 108 (90 Base) MCG/ACT inhaler Commonly known as: VENTOLIN HFA INHALE 2 PUFFS INTO THE LUNGS  EVERY 6 HOURS AS NEEDED FOR WHEEZING FOR SHORTNESS OF BREATH   alendronate 70 MG tablet Commonly known as: FOSAMAX Take one tablet once weekly. Take with a full glass of water on an empty stomach.   aspirin EC 81 MG tablet Take 1 tablet (81 mg total) by mouth daily.   atorvastatin 40 MG tablet Commonly  known as: LIPITOR Take 1 tablet (40 mg total) by mouth daily.   fluticasone 50 MCG/ACT nasal spray Commonly known as: FLONASE Place 1 spray into both nostrils as needed for allergies or rhinitis. What changed: Another medication with the same name was removed. Continue taking this medication, and follow the directions you see here. Changed by: Terry Hughs, NP   losartan 25 MG tablet Commonly known as: COZAAR Take 1 tablet (25 mg total) by mouth daily.   metoprolol succinate 25 MG 24 hr tablet Commonly known as: TOPROL-XL Take 25 mg by mouth daily at 12 noon.   multivitamin-iron-minerals-folic acid chewable tablet Chew 1 tablet by mouth daily.   Omega 3 1000 MG Caps Take by mouth daily.   tiotropium 18 MCG inhalation capsule Commonly known as: SPIRIVA Place 18 mcg into inhaler and inhale as needed. What changed: Another medication with the same name was removed. Continue taking this medication, and follow the directions you see here. Changed by: Terry Hughs, NP       Review of Systems  Constitutional: Negative for appetite change, chills, fatigue and fever.  HENT: Negative for congestion, postnasal drip, rhinorrhea, sinus pressure, sinus pain, sneezing, sore throat and trouble swallowing.   Eyes: Negative for discharge, redness, itching and visual disturbance.  Respiratory: Negative for cough, chest tightness, shortness of breath and wheezing.   Cardiovascular: Negative for chest pain, palpitations and leg swelling.  Gastrointestinal: Positive for constipation. Negative for abdominal distention, abdominal pain, diarrhea, nausea and vomiting.  Endocrine: Negative for cold intolerance, heat intolerance, polydipsia, polyphagia and polyuria.  Genitourinary: Negative for difficulty urinating, dysuria, flank pain, frequency and urgency.  Musculoskeletal: Negative for arthralgias and gait problem.  Skin: Negative for color change, pallor and rash.  Neurological: Negative  for dizziness, speech difficulty, weakness, light-headedness, numbness and headaches.  Hematological: Does not bruise/bleed easily.  Psychiatric/Behavioral: Negative for agitation, confusion and sleep disturbance. The patient is not nervous/anxious.     Immunization History  Administered Date(s) Administered  . Fluad Quad(high Dose 65+) 02/15/2019  . Influenza Split 01/28/2012  . Influenza, High Dose Seasonal PF 06/20/2016, 02/10/2018  . Influenza,inj,Quad PF,6+ Mos 04/08/2013, 04/15/2014, 01/11/2015, 03/03/2017  . Moderna Sars-Covid-2 Vaccination 06/16/2019, 07/19/2019  . Pneumococcal Conjugate-13 07/26/2015  . Pneumococcal Polysaccharide-23 11/14/2010  . Td 06/28/2019  . Tdap 10/11/2008  . Zoster 05/05/2014   Pertinent  Health Maintenance Due  Topic Date Due  . OPHTHALMOLOGY EXAM  07/15/2018  . INFLUENZA VACCINE  08/02/2020 (Originally 12/04/2019)  . FOOT EXAM  06/27/2020  . HEMOGLOBIN A1C  07/19/2020  . COLONOSCOPY (Pts 45-60yr Insurance coverage will need to be confirmed)  06/11/2022  . DEXA SCAN  Completed  . PNA vac Low Risk Adult  Completed   Fall Risk  06/19/2020 01/20/2020 07/07/2019 06/28/2019 06/28/2019  Falls in the past year? 0 0 0 0 0  Number falls in past yr: 0 0 0 0 -  Injury with  Fall? 0 0 0 0 -  Follow up - Falls evaluation completed Falls evaluation completed;Education provided Falls evaluation completed Falls evaluation completed   Functional Status Survey:    Vitals:   06/19/20 1311  BP: (!) 160/92  Armstrong: 80  Resp: 16  Temp: (!) 97.3 F (36.3 C)  SpO2: 96%  Weight: 116 lb 4.8 oz (52.8 kg)  Height: 5' 3.5" (1.613 m)   Body mass index is 20.28 kg/m. Physical Exam Vitals reviewed.  Constitutional:      General: She is not in acute distress.    Appearance: She is normal weight. She is not ill-appearing.  HENT:     Head: Normocephalic.     Right Ear: There is impacted cerumen.     Left Ear: Tympanic membrane, ear canal and external ear normal. There  is no impacted cerumen.     Nose: Nose normal. No congestion or rhinorrhea.     Mouth/Throat:     Mouth: Mucous membranes are moist.     Pharynx: Oropharynx is clear. No oropharyngeal exudate or posterior oropharyngeal erythema.  Eyes:     General: No scleral icterus.       Right eye: No discharge.        Left eye: No discharge.     Extraocular Movements: Extraocular movements intact.     Conjunctiva/sclera: Conjunctivae normal.     Pupils: Pupils are equal, round, and reactive to light.  Neck:     Thyroid: No thyroid tenderness.     Vascular: No carotid bruit.     Comments: Left thyroid mass palpable.  Cardiovascular:     Rate and Rhythm: Normal rate and regular rhythm.     Pulses: Normal pulses.     Heart sounds: Normal heart sounds. No murmur heard. No friction rub. No gallop.   Pulmonary:     Effort: Pulmonary effort is normal. No respiratory distress.     Breath sounds: Normal breath sounds. No wheezing, rhonchi or rales.  Chest:     Chest wall: No tenderness.  Abdominal:     General: Bowel sounds are normal. There is no distension.     Palpations: Abdomen is soft. There is no mass.     Tenderness: There is no abdominal tenderness. There is no right CVA tenderness, left CVA tenderness, guarding or rebound.  Musculoskeletal:        General: No swelling or tenderness. Normal range of motion.     Cervical back: Normal range of motion. No rigidity or tenderness.     Right lower leg: No edema.     Left lower leg: No edema.  Lymphadenopathy:     Cervical: No cervical adenopathy.  Skin:    General: Skin is warm and dry.     Coloration: Skin is not pale.     Findings: No bruising, erythema or rash.  Neurological:     Mental Status: She is alert and oriented to person, place, and time.     Cranial Nerves: No cranial nerve deficit.     Sensory: No sensory deficit.     Motor: No weakness.     Coordination: Coordination normal.     Gait: Gait normal.  Psychiatric:         Mood and Affect: Mood normal.        Behavior: Behavior normal.        Thought Content: Thought content normal.        Judgment: Judgment normal.     Labs reviewed: Recent  Labs    06/28/19 0937 09/11/19 0827  NA 144 142  K 4.8 4.4  CL 107* 110  CO2 20 26  GLUCOSE 109* 112*  BUN 14 10  CREATININE 0.84 0.67  CALCIUM 9.4 9.1  MG  --  1.9   Recent Labs    06/28/19 0937  AST 30  ALT 31  ALKPHOS 76  BILITOT 0.5  PROT 6.6  ALBUMIN 4.3   Recent Labs    06/28/19 0913 09/11/19 0827  WBC 8.6 7.4  HGB 14.1 13.0  HCT 41.6* 39.2  MCV 96.6 97.5  PLT  --  255   Lab Results  Component Value Date   TSH 0.192 (L) 09/11/2019   Lab Results  Component Value Date   HGBA1C 5.9 (A) 01/20/2020   Lab Results  Component Value Date   CHOL 180 01/20/2020   HDL 59 01/20/2020   LDLCALC 101 (H) 01/20/2020   LDLDIRECT 81 04/29/2018   TRIG 111 01/20/2020   CHOLHDL 3.1 01/20/2020    Significant Diagnostic Results in last 30 days:  No results found.  Assessment/Plan  1. Centrilobular emphysema (HCC) Breathing stable. - continue on Albuterol and Spiriva  - albuterol (VENTOLIN HFA) 108 (90 Base) MCG/ACT inhaler; INHALE 2 PUFFS INTO THE LUNGS  EVERY 6 HOURS AS NEEDED FOR WHEEZING FOR SHORTNESS OF BREATH  Dispense: 18 g; Refill: 0  2. Essential hypertension B/p elevated.Has been out of her medication for the past two months.  Continue on losartan and Metoprolol  - losartan (COZAAR) 25 MG tablet; Take 1 tablet (25 mg total) by mouth daily.  Dispense: 90 tablet; Refill: 1 - CBC with Differential/Platelet; Future - CMP with eGFR([facility]); Future - TSH; Future  3. Age related osteoporosis, unspecified pathological fracture presence - No fall episode or fracture  - alendronate (FOSAMAX) 70 MG tablet; Take one tablet once weekly. Take with a full glass of water on an empty stomach.  Dispense: 12 tablet; Refill: 3 - DG Bone Density; Future  4. Hyperlipidemia LDL goal <70 Latest LDL  on chart not at goal - continue on Atorvastatin 40 mg tablet daily and Omega -3 1000 mg capsule daily.  - dietary and lifestyle modification advised  - Lipid panel; Future  5. Tobacco user Smoking cessation advised willing to quit request medication to assist. Will start on Bupropion.side effects discussed.  - buPROPion (WELLBUTRIN XL) 150 MG 24 hr tablet; Take 1 tablet (150 mg total) by mouth daily.  Dispense: 30 tablet; Refill: 0  6. Impacted cerumen of right ear TM not visualized due to cerumen impaction. - Advised to instil debrox 6.5% 5 drops into right ear x 4 days then follow up for ear lavage.  - carbamide peroxide (DEBROX) 6.5 % OTIC solution; Place 5 drops into the right ear 2 (two) times daily. Right ear  Dispense: 15 mL; Refill: 0  7. Type 2 diabetes mellitus without complication, without long-term c Lab Results  Component Value Date   HGBA1C 5.9 (A) 01/20/2020  urrent use of insulin (HCC) No CBG for review.Diet controlled. - Hemoglobin A1c; Future - on ASA and Statin   8. Breast cancer screening by mammogram Asymptomatic. Advised imaging will call for appointment. - MM DIGITAL SCREENING BILATERAL; Future  9. Aortic atherosclerosis (HCC) - smoking cessation advised.Start on Wellbutrin as above. - continue on ASA and Atorvastatin   10. Tobacco use disorder, continuous Smokes 2 cigarettes per day.  - buPROPion (WELLBUTRIN XL) 150 MG 24 hr tablet; Take 1 tablet (150 mg  total) by mouth daily.  Dispense: 30 tablet; Refill: 0  12. Multinodular goiter Hx of low TSH level with CT scan 09/03/2016 and 05/06/2020 which showed multinodular goiter asymmetrically enlarged on the left.[facility] up [facility] seen last 07/01/2019 she was asymptomatic thyroid uptake and scan and advised to follow up in 6 months.No follow up appointment noted.palpable left thyroid nodule during visit non-tender.will obtain labs to evaluate will need follow up with  Endocrinologist.   - TSH; Future  Family/ staff Communication: Reviewed plan of care with patient verbalized understanding.  Labs/tests ordered:   - CBC with Differential/Platelet; Future - CMP with eGFR([facility]); Future - TSH; Future - Hemoglobin A1c; Future  Next Appointment : 2 weeks for follow up high blood pressure.Fasting blood work in 1 week or sooner.   Time spent with patient 60 minutes >50% time spent counseling; reviewing medical record; tests; labs; and developing future plan of care   Terry Hughs, NP

## 2020-06-22 ENCOUNTER — Other Ambulatory Visit: Payer: Self-pay | Admitting: *Deleted

## 2020-06-22 DIAGNOSIS — H6121 Impacted cerumen, right ear: Secondary | ICD-10-CM

## 2020-06-22 MED ORDER — DEBROX 6.5 % OT SOLN: 5.0000 [drp] | Freq: Two times a day (BID) | OTIC | 0 refills | Status: DC

## 2020-06-22 NOTE — Telephone Encounter (Signed)
Patient called and stated that pharmacy never received the ear drops sent in on Tuesday. Wants it refaxed.

## 2020-06-27 ENCOUNTER — Other Ambulatory Visit: Payer: PPO

## 2020-06-27 ENCOUNTER — Other Ambulatory Visit: Payer: Self-pay

## 2020-06-27 DIAGNOSIS — I10 Essential (primary) hypertension: Secondary | ICD-10-CM | POA: Diagnosis not present

## 2020-06-27 DIAGNOSIS — E119 Type 2 diabetes mellitus without complications: Secondary | ICD-10-CM | POA: Diagnosis not present

## 2020-06-27 DIAGNOSIS — E785 Hyperlipidemia, unspecified: Secondary | ICD-10-CM | POA: Diagnosis not present

## 2020-06-28 LAB — TSH: TSH: 0.39 mIU/L — ABNORMAL LOW (ref 0.40–4.50)

## 2020-06-28 LAB — COMPLETE METABOLIC PANEL WITH GFR
AG Ratio: 1.8 (calc) (ref 1.0–2.5)
ALT: 16 U/L (ref 6–29)
AST: 15 U/L (ref 10–35)
Albumin: 3.9 g/dL (ref 3.6–5.1)
Alkaline phosphatase (APISO): 59 U/L (ref 37–153)
BUN/Creatinine Ratio: 22 (calc) (ref 6–22)
BUN: 12 mg/dL (ref 7–25)
CO2: 25 mmol/L (ref 20–32)
Calcium: 9.3 mg/dL (ref 8.6–10.4)
Chloride: 108 mmol/L (ref 98–110)
Creat: 0.55 mg/dL — ABNORMAL LOW (ref 0.60–0.93)
GFR, Est African American: 106 mL/min/{1.73_m2} (ref >=60)
GFR, Est Non African American: 92 mL/min/{1.73_m2} (ref >=60)
Globulin: 2.2 g/dL (calc) (ref 1.9–3.7)
Glucose, Bld: 95 mg/dL (ref 65–99)
Potassium: 4.7 mmol/L (ref 3.5–5.3)
Sodium: 141 mmol/L (ref 135–146)
Total Bilirubin: 0.5 mg/dL (ref 0.2–1.2)
Total Protein: 6.1 g/dL (ref 6.1–8.1)

## 2020-06-28 LAB — HEMOGLOBIN A1C
Hgb A1c MFr Bld: 5.8 % of total Hgb — ABNORMAL HIGH (ref <5.7)
Mean Plasma Glucose: 120 mg/dL
eAG (mmol/L): 6.6 mmol/L

## 2020-06-28 LAB — LIPID PANEL
Cholesterol: 156 mg/dL (ref <200)
HDL: 65 mg/dL (ref >=50)
LDL Cholesterol (Calc): 74 mg/dL (calc)
Non-HDL Cholesterol (Calc): 91 mg/dL (calc) (ref <130)
Total CHOL/HDL Ratio: 2.4 (calc) (ref <5.0)
Triglycerides: 83 mg/dL (ref <150)

## 2020-06-28 LAB — CBC WITH DIFFERENTIAL/PLATELET
Absolute Monocytes: 570 cells/uL (ref 200–950)
Basophils Absolute: 40 cells/uL (ref 0–200)
Basophils Relative: 0.6 %
Eosinophils Absolute: 268 cells/uL (ref 15–500)
Eosinophils Relative: 4 %
HCT: 40.2 % (ref 35.0–45.0)
Hemoglobin: 13.9 g/dL (ref 11.7–15.5)
Lymphs Abs: 1675 cells/uL (ref 850–3900)
MCH: 33.3 pg — ABNORMAL HIGH (ref 27.0–33.0)
MCHC: 34.6 g/dL (ref 32.0–36.0)
MCV: 96.2 fL (ref 80.0–100.0)
MPV: 10.8 fL (ref 7.5–12.5)
Monocytes Relative: 8.5 %
Neutro Abs: 4147 cells/uL (ref 1500–7800)
Neutrophils Relative %: 61.9 %
Platelets: 270 10*3/uL (ref 140–400)
RBC: 4.18 10*6/uL (ref 3.80–5.10)
RDW: 12.5 % (ref 11.0–15.0)
Total Lymphocyte: 25 %
WBC: 6.7 10*3/uL (ref 3.8–10.8)

## 2020-07-04 ENCOUNTER — Ambulatory Visit: Payer: PPO | Admitting: Family

## 2020-07-04 ENCOUNTER — Other Ambulatory Visit: Payer: Self-pay

## 2020-07-04 ENCOUNTER — Encounter: Payer: Self-pay | Admitting: Family

## 2020-07-04 VITALS — BP 110/70 | HR 66 | Temp 97.9°F | Resp 20 | Ht 64.0 in | Wt 115.0 lb

## 2020-07-04 DIAGNOSIS — H6121 Impacted cerumen, right ear: Secondary | ICD-10-CM

## 2020-07-04 DIAGNOSIS — E042 Nontoxic multinodular goiter: Secondary | ICD-10-CM

## 2020-07-04 DIAGNOSIS — R7989 Other specified abnormal findings of blood chemistry: Secondary | ICD-10-CM | POA: Diagnosis not present

## 2020-07-04 DIAGNOSIS — I10 Essential (primary) hypertension: Secondary | ICD-10-CM

## 2020-07-04 DIAGNOSIS — E119 Type 2 diabetes mellitus without complications: Secondary | ICD-10-CM

## 2020-07-04 DIAGNOSIS — E785 Hyperlipidemia, unspecified: Secondary | ICD-10-CM

## 2020-07-04 LAB — T3, FREE: T3, Free: 3.1 pg/mL (ref 2.3–4.2)

## 2020-07-04 LAB — T4, FREE: Free T4: 1.3 ng/dL (ref 0.8–1.8)

## 2020-07-04 NOTE — Progress Notes (Addendum)
Provider: Marlowe Sax FNP-C   Espiridion Supinski, Nelda Bucks, NP  Patient Care Team: Lugene Hitt, Nelda Bucks, NP as PCP - General (Family Medicine) Shawnee Knapp, MD as PCP - Family Medicine (Family Medicine) Skeet Latch, MD as PCP - Cardiology (Cardiology) Clent Jacks, MD as Consulting Physician (Ophthalmology)  Extended Emergency Contact Information Primary Emergency Contact: [facility] Address: [address]          [place], [place] [address] Johnnette Litter of [place] Phone: [phone] Mobile Phone: [phone] Relation: Spouse Secondary Emergency Contact: Juliann Pulse [place] of [place] Phone: [phone] Relation: Relative  Code Status:  Full Code  Goals of care: Advanced Directive information Advanced Directives 06/19/2020  Does Patient Have a Medical Advance Directive? No  Type of Advance Directive -  Does patient want to make changes to medical advance directive? -  Would patient like information on creating a medical advance directive? No - Patient declined     Chief Complaint  Patient presents with  . Medical Management of Chronic Issues    2 Weeks Follow Up    HPI:  Pt is a 75 y.o. female seen today for 2 weeks follow up for high blood pressure.He was here 06/19/2020 B/p was 160/92 but was out of her losartan for the past two months.Losartan was ordered and advised to check blood pressure at home and bring log today.  Blood pressure log reviewed readings in the 110's/60's - 120's /70's with one reading of 130/74 and another 140/70.HR in the 60's -80's x 1 reading 90 b/min.  She had fasting lab work done 06/27/2020.labs reviewed and discussed today.cholesterol was normal. Electrolytes,kidney and liver function and hemoglobin were all normal. Hgb A1C 5.8 previous was 5.9 > 6.2    TSH level was low 0.39 previous  0.192;0.20; 0.23 asymptomatic but states stays cold most of the time.Had constipation but improved since I advised her to increased water intake  on last visit.she denies any anxiety,weakness,tremors,palpitation,heat intolerance.Has had a 1 lbs weight loss since last visit.  Her Chest CT scan done 5/2/20218 and 05/16/2020 showed stable multinodular goiter asymmetrically enlarged on the left with dominant hypodense 1.6 cm left thyroid nodule.on 07/14/2019 thyroid mult uptake was consistent with multinodular goiter but thyroid uptake was normal.  Follows up Dr.Gherghe cristina at [facility] seen last 07/01/2019 she was asymptomatic was told to follow up in 6 months.No follow up appointment noted.palpable left thyroid nodule during visit non-tender   Past Medical History:  Diagnosis Date  . Allergy   . Breast abscess   . Constipation    if drinks alot of water no issues - uses womens stool softener PRN only   . Diabetes mellitus without complication ([place])    pt has no meds- diet controlled only   . Exertional dyspnea 08/18/2019  . H/O mammogram 05/06/2019   Per [place] new patient packet  . Hx of adenomatous colonic polyps 06/19/2017  . Hypercholesteremia    Per [place] new patient packet  . Hyperlipidemia   . Hypertension    Per [place] new patient packet  . Palpitations 08/18/2019  . Primary snoring   . Thyroid condition    Per [place] new patient packet  . VT (ventricular tachycardia) ([place]) 09/11/2019   Past Surgical History:  Procedure Laterality Date  . ABDOMINAL HYSTERECTOMY  1993  . COLONOSCOPY    . POLYPECTOMY      No Known Allergies  Allergies as of 07/04/2020   No Known Allergies     Medication List  Accurate as of July 04, 2020  1:06 PM. If you have any questions, ask your nurse or doctor.        albuterol 108 (90 Base) MCG/ACT inhaler Commonly known as: VENTOLIN HFA INHALE 2 PUFFS INTO THE LUNGS  EVERY 6 HOURS AS NEEDED FOR WHEEZING FOR SHORTNESS OF BREATH   alendronate 70 MG tablet Commonly known as: FOSAMAX Take one tablet once weekly. Take with a full glass of water on an empty stomach.   aspirin EC 81  MG tablet Take 1 tablet (81 mg total) by mouth daily.   atorvastatin 40 MG tablet Commonly known as: LIPITOR Take 1 tablet (40 mg total) by mouth daily.   buPROPion 150 MG 24 hr tablet Commonly known as: WELLBUTRIN XL Take 1 tablet (150 mg total) by mouth daily.   Debrox 6.5 % OTIC solution Generic drug: carbamide peroxide Place 5 drops into the right ear 2 (two) times daily. Right ear   fluticasone 50 MCG/ACT nasal spray Commonly known as: FLONASE Place 1 spray into both nostrils as needed for allergies or rhinitis.   losartan 25 MG tablet Commonly known as: COZAAR Take 1 tablet (25 mg total) by mouth daily.   metoprolol succinate 25 MG 24 hr tablet Commonly known as: TOPROL-XL Take 25 mg by mouth daily at 12 noon.   multivitamin-iron-minerals-folic acid chewable tablet Chew 1 tablet by mouth daily.   Omega 3 1000 MG Caps Take by mouth daily.   tiotropium 18 MCG inhalation capsule Commonly known as: SPIRIVA Place 18 mcg into inhaler and inhale as needed.       Review of Systems  Constitutional: Negative for appetite change, chills, fatigue and fever.  HENT: Negative for congestion, rhinorrhea, sinus pressure, sinus pain, sneezing and sore throat.   Eyes: Negative for discharge, redness, itching and visual disturbance.  Respiratory: Negative for cough, chest tightness, shortness of breath and wheezing.   Cardiovascular: Negative for chest pain, palpitations and leg swelling.  Gastrointestinal: Negative for abdominal distention, abdominal pain, constipation, diarrhea, nausea and vomiting.  Endocrine: Negative for cold intolerance, heat intolerance, polydipsia, polyphagia and polyuria.  Genitourinary: Negative for difficulty urinating, dysuria, flank pain, frequency and urgency.  Musculoskeletal: Negative for arthralgias, back pain and gait problem.  Skin: Negative for color change, pallor and rash.  Neurological: Negative for dizziness, tremors, speech difficulty,  weakness, light-headedness, numbness and headaches.  Hematological: Does not bruise/bleed easily.  Psychiatric/Behavioral: Negative for agitation, confusion and sleep disturbance. The patient is not nervous/anxious.     Immunization History  Administered Date(s) Administered  . Fluad Quad(high Dose 65+) 02/15/2019  . Influenza Split 01/28/2012  . Influenza, High Dose Seasonal PF 06/20/2016, 02/10/2018  . Influenza,inj,Quad PF,6+ Mos 04/08/2013, 04/15/2014, 01/11/2015, 03/03/2017  . Moderna Sars-Covid-2 Vaccination 06/16/2019, 07/19/2019, 04/12/2020  . Pneumococcal Conjugate-13 07/26/2015  . Pneumococcal Polysaccharide-23 11/14/2010  . Td 06/28/2019  . Tdap 10/11/2008  . Zoster 05/05/2014   Pertinent  Health Maintenance Due  Topic Date Due  . OPHTHALMOLOGY EXAM  07/15/2018  . FOOT EXAM  06/27/2020  . INFLUENZA VACCINE  08/02/2020 (Originally 12/04/2019)  . HEMOGLOBIN A1C  12/25/2020  . COLONOSCOPY (Pts 45-31yr Insurance coverage will need to be confirmed)  06/11/2022  . DEXA SCAN  Completed  . PNA vac Low Risk Adult  Completed   Fall Risk  06/19/2020 01/20/2020 07/07/2019 06/28/2019 06/28/2019  Falls in the past year? 0 0 0 0 0  Number falls in past yr: 0 0 0 0 -  Injury with Fall? 0  0 0 0 -  Follow up - Falls evaluation completed Falls evaluation completed;Education provided Falls evaluation completed Falls evaluation completed   Functional Status Survey:    Vitals:   07/04/20 1253  Pulse: 66  Resp: 20  Temp: 97.9 F (36.6 C)  TempSrc: Temporal  SpO2: 95%  Weight: 115 lb (52.2 kg)  Height: _0  (1.626 m)   Body mass index is 19.74 kg/m. Physical Exam Vitals reviewed.  Constitutional:      General: She is not in acute distress.    Appearance: She is normal weight. She is not ill-appearing.  HENT:     Head: Normocephalic.     Right Ear: There is impacted cerumen.     Left Ear: Tympanic membrane, ear canal and external ear normal. There is no impacted cerumen.      Ears:     Comments: Right ear cerumen lavaged with warm water and hydrogen peroxide moderate amounts removed tolerated procedure well.currette used.No signs of infection noted. Eyes:     General: No scleral icterus.       Right eye: No discharge.        Left eye: No discharge.     Conjunctiva/sclera: Conjunctivae normal.     Pupils: Pupils are equal, round, and reactive to light.  Neck:     Vascular: No carotid bruit.     Comments: Left palpable thyroid mass non-tender to touch Cardiovascular:     Rate and Rhythm: Normal rate and regular rhythm.     Pulses: Normal pulses.     Heart sounds: Normal heart sounds. No murmur heard. No friction rub. No gallop.   Pulmonary:     Effort: Pulmonary effort is normal. No respiratory distress.     Breath sounds: Normal breath sounds. No wheezing, rhonchi or rales.  Chest:     Chest wall: No tenderness.  Abdominal:     General: Bowel sounds are normal. There is no distension.     Palpations: Abdomen is soft. There is no mass.     Tenderness: There is no abdominal tenderness. There is no right CVA tenderness, left CVA tenderness, guarding or rebound.  Musculoskeletal:        General: No swelling or tenderness. Normal range of motion.     Cervical back: Normal range of motion. No rigidity or tenderness.     Right lower leg: No edema.     Left lower leg: No edema.  Lymphadenopathy:     Cervical: No cervical adenopathy.  Skin:    General: Skin is warm and dry.     Coloration: Skin is not pale.     Findings: No bruising, erythema or rash.  Neurological:     Mental Status: She is alert and oriented to person, place, and time.     Cranial Nerves: No cranial nerve deficit.     Sensory: No sensory deficit.     Motor: No weakness.     Coordination: Coordination normal.     Gait: Gait normal.  Psychiatric:        Mood and Affect: Mood normal.        Behavior: Behavior normal.        Thought Content: Thought content normal.        Judgment:  Judgment normal.     Labs reviewed: Recent Labs    09/11/19 0827 06/27/20 0833  NA 142 141  K 4.4 4.7  CL 110 108  CO2 26 25  GLUCOSE 112* 95  BUN 10  12  CREATININE 0.67 0.55*  CALCIUM 9.1 9.3  MG 1.9  --    Recent Labs    06/27/20 0833  AST 15  ALT 16  BILITOT 0.5  PROT 6.1   Recent Labs    09/11/19 0827 06/27/20 0833  WBC 7.4 6.7  NEUTROABS  --  4,147  HGB 13.0 13.9  HCT 39.2 40.2  MCV 97.5 96.2  PLT 255 270   Lab Results  Component Value Date   TSH 0.39 (L) 06/27/2020   Lab Results  Component Value Date   HGBA1C 5.8 (H) 06/27/2020   Lab Results  Component Value Date   CHOL 156 06/27/2020   HDL 65 06/27/2020   LDLCALC 74 06/27/2020   LDLDIRECT 81 04/29/2018   TRIG 83 06/27/2020   CHOLHDL 2.4 06/27/2020    Significant Diagnostic Results in last 30 days:  No results found.  Assessment/Plan 1. Essential hypertension B/p well controlled since restarting her losartan. - continue on losartan and metoprolol - CBC with Differential/Platelet; Future - CMP with eGFR([facility]); Future  2. Type 2 diabetes mellitus without complication, without long-term current use of insulin ([facility]) Lab Results  Component Value Date   HGBA1C 5.8 (H) 06/27/2020  - advised to continue dietary and lifestyle modification.  - Hemoglobin A1c; Future  3. Hyperlipidemia LDL goal <70 LDL 74  - continue on atorvastatin 40 mg tablet daily. - continue on dietary and lifestyle modification  - Lipid panel; Future  4. Impacted cerumen of right ear Right ear cerumen lavaged with warm water and hydrogen peroxide moderate amounts removed tolerated procedure well.currette used.No signs of infection noted.  5. Abnormal TSH Lab Results  Component Value Date   TSH 0.39 (L) 06/27/2020  Asymptomatic suspect possible subclinical hyperthyroidism.will recheck Free T3 and T4 today.one pound weight loss noted since previous visit few days ago though seems to have gained compared to previous  weight with previous PCP 01/20/2020 wt 114 lbs  - T3, free - T4, free   6.Multithyroid Goiter  Palpable left Thyroid mass on left thyroid non-tender to touch.TSH level persistently low.Follows up Dr.Gherghe cristina at [facility] seen last 07/01/2019 she was asymptomatic thyroid on last visit advised to follow up in 6 months.No follow up appointment noted.will recheck T3 and T4 then referral to Endocrinologist for 6 months follow up  - T3, free - T4, free   Family/ staff Communication: Reviewed plan of care with patient verbalized understanding.   Labs/tests ordered:  - T3, free - T4, free  Next Appointment : 6 months for medical management of chronic issues.  Sandrea Hughs, NP

## 2020-07-04 NOTE — Patient Instructions (Signed)
-   continue current medication 

## 2020-07-09 ENCOUNTER — Other Ambulatory Visit: Payer: Self-pay | Admitting: Family

## 2020-07-09 DIAGNOSIS — E042 Nontoxic multinodular goiter: Secondary | ICD-10-CM

## 2020-07-12 ENCOUNTER — Encounter: Payer: Self-pay | Admitting: Internal Medicine

## 2020-07-12 NOTE — Progress Notes (Unsigned)
Received labs from PCP, drawn on 06/27/2020:  TSH was better, at 0.39 -it was noted to be low, but I do not have a normal range. Free T3 and free T4 were normal, at 3.1 (2.8-1.2) and 1.3 (0.8-1.8) respectively.  I last saw the patient in 06/2019.  At that time, I advised her to return in 6 months however, she was lost for follow-up.  I will certainly see her back if she wants to schedule a new appointment.

## 2020-10-04 ENCOUNTER — Encounter: Payer: Self-pay | Admitting: Internal Medicine

## 2021-01-08 ENCOUNTER — Other Ambulatory Visit: Payer: PPO

## 2021-01-10 ENCOUNTER — Ambulatory Visit: Payer: PPO | Admitting: Family

## 2021-01-15 ENCOUNTER — Encounter: Payer: Self-pay | Admitting: Family

## 2021-01-15 ENCOUNTER — Other Ambulatory Visit: Payer: Self-pay

## 2021-01-15 ENCOUNTER — Other Ambulatory Visit: Payer: PPO

## 2021-01-15 ENCOUNTER — Ambulatory Visit (INDEPENDENT_AMBULATORY_CARE_PROVIDER_SITE_OTHER): Payer: PPO | Admitting: Family

## 2021-01-15 DIAGNOSIS — E119 Type 2 diabetes mellitus without complications: Secondary | ICD-10-CM

## 2021-01-15 DIAGNOSIS — I10 Essential (primary) hypertension: Secondary | ICD-10-CM

## 2021-01-15 DIAGNOSIS — Z0000 Encounter for general adult medical examination without abnormal findings: Secondary | ICD-10-CM | POA: Diagnosis not present

## 2021-01-15 DIAGNOSIS — E785 Hyperlipidemia, unspecified: Secondary | ICD-10-CM | POA: Diagnosis not present

## 2021-01-15 NOTE — Progress Notes (Signed)
  This service is provided via telemedicine  No vital signs collected/recorded due to the encounter was a telemedicine visit.   Location of patient (ex: home, work):  Home.  Patient consents to a telephone visit:  Yes  Location of the provider (ex: office, home):  [facility] Office.  Name of any referring provider:  Ngetich, Dinah C, NP   Names of all persons participating in the telemedicine service and their role in the encounter:  Patient, Terry Armstrong, RMA, Ngetich, Dinah, NP.    Time spent on call:  8 minutes spent on the phone with Medical Assistant.    

## 2021-01-15 NOTE — Progress Notes (Signed)
Subjective:   Terry Armstrong is a 75 y.o. female who presents for Medicare Annual (Subsequent) preventive examination.  Review of Systems     Cardiac Risk Factors include: advanced age (>53mn, >>55women);hypertension;dyslipidemia;smoking/ tobacco exposure     Objective:    There were no vitals filed for this visit. There is no height or weight on file to calculate BMI.  Advanced Directives 01/15/2021 06/19/2020 09/11/2019 07/07/2019 05/28/2017 03/03/2017  Does Patient Have a Medical Advance Directive? No No Yes No No No  Type of Advance Directive - - [place] Does patient want to make changes to medical advance directive? - - - - - Yes (MAU/Ambulatory/Procedural Areas - Information given)  Would patient like information on creating a medical advance directive? No - Patient declined No - Patient declined - Yes (ED - Information included in AVS) - -    Current Medications (verified) Outpatient Encounter Medications as of 01/15/2021  Medication Sig   albuterol (VENTOLIN HFA) 108 (90 Base) MCG/ACT inhaler INHALE 2 PUFFS INTO THE LUNGS  EVERY 6 HOURS AS NEEDED FOR WHEEZING FOR SHORTNESS OF BREATH   alendronate (FOSAMAX) 70 MG tablet Take one tablet once weekly. Take with a full glass of water on an empty stomach.   aspirin EC 81 MG tablet Take 1 tablet (81 mg total) by mouth daily.   atorvastatin (LIPITOR) 40 MG tablet Take 1 tablet (40 mg total) by mouth daily.   buPROPion (WELLBUTRIN XL) 150 MG 24 hr tablet Take 1 tablet (150 mg total) by mouth daily.   carbamide peroxide (DEBROX) 6.5 % OTIC solution Place 5 drops into the right ear 2 (two) times daily. Right ear   fluticasone (FLONASE) 50 MCG/ACT nasal spray Place 1 spray into both nostrils as needed for allergies or rhinitis.   losartan (COZAAR) 25 MG tablet Take 1 tablet (25 mg total) by mouth daily.   metoprolol succinate (TOPROL-XL) 25 MG 24 hr tablet Take 25 mg by mouth daily.    multivitamin-iron-minerals-folic acid (CENTRUM) chewable tablet Chew 1 tablet by mouth daily.   Omega 3 1000 MG CAPS Take by mouth daily.   tiotropium (SPIRIVA) 18 MCG inhalation capsule Place 18 mcg into inhaler and inhale as needed.   No facility-administered encounter medications on file as of 01/15/2021.    Allergies (verified) Patient has no known allergies.   History: Past Medical History:  Diagnosis Date   Allergy    Breast abscess    Constipation    if drinks alot of water no issues - uses womens stool softener PRN only    Diabetes mellitus without complication ([place]    pt has no meds- diet controlled only    Exertional dyspnea 08/18/2019   H/O mammogram 05/06/2019   Per [facility] new patient packet   Hx of adenomatous colonic polyps 06/19/2017   Hypercholesteremia    Per [facility] new patient packet   Hyperlipidemia    [place]    [facility]   Palpitations 08/18/2019   Primary snoring    Thyroid condition    Per [facility] new patient packet   [place] 09/11/2019   Past Surgical History:  Procedure Laterality Date   ABDOMINAL HYSTERECTOMY  1993   COLONOSCOPY     POLYPECTOMY     Family History  Problem Relation Age of Onset   Diabetes Mother    Diabetes Father    Diabetes Other    Hypertension Sister  Hyperlipidemia Other    Heart disease Sister        defibrillator   Kidney failure Maternal Aunt        dialysis   Diabetes Maternal Aunt    Hypertension Maternal Aunt    Diabetes Maternal Aunt    Heart Problems Maternal Grandmother    Cancer Maternal Grandmother    Heart disease Maternal Grandmother    Hypertension Maternal Grandmother    Diabetes Brother    Colon cancer Neg Hx    Colon polyps Neg Hx    Rectal cancer Neg Hx    Stomach cancer Neg Hx    Social History   Socioeconomic History   Marital status: Married    Spouse name: Not on file   Number of children: 0   Years of education: 14   Highest education level:  Not on file  Occupational History    Employer: RETIRED  Tobacco Use   Smoking status: Every Day    Packs/day: 1.50    Years: 49.00    Pack years: 73.50    Types: Cigarettes   Smokeless tobacco: Never   Tobacco comments:    1/2 ppd 01/16/20  Vaping Use   Vaping Use: Never used  Substance and Sexual Activity   Alcohol use: Yes    Comment: 2-4 per week   Drug use: No   Sexual activity: Not Currently  Other Topics Concern   Not on file  Social History Narrative   Diet       Do you drink/eat things with caffeine Coffee      Marital Status Married What year were you married? 2015      Do you live in a house, apartment, assisted living, condo, trailer, etc.? [place]      Is it one or more stories? Yes 3 stories      How many persons live in your home? 2        Do you have any pets in your home?(please list) No      Highest level of education completed: 2 years college      Current or past profession: [employer]      Do you exercise?: Yes    Type and how often: Daily      Do you have a Living Will? (Form that indicates scenarios where you would not want your life prolonged) No      Do you have a DNR form?         If not, would you like to discuss one? No      Do you have signed POA/HPOA forms? No      Do you have difficulty bathing or dressing yourself? No      Do you have difficulty preparing food or eating? No      Do you have difficulty managing medications? No      Do you have difficulty managing your finances? No      Do you have difficulty affording your medications? No                     Social Determinants of Radio broadcast assistant Strain: Not on file  Food Insecurity: Not on file  Transportation Needs: Not on file  Physical Activity: Not on file  Stress: Not on file  Social Connections: Not on file    Tobacco Counseling Ready to quit: Not Answered Counseling given: Not Answered Tobacco comments: 1/2 ppd 01/16/20   Clinical  Intake:  Pre-visit preparation completed: No  Pain : No/denies pain     BMI - recorded: 19.74 Nutritional Status: BMI of 19-24  Normal Nutritional Risks: None Diabetes: No  How often do you need to have someone help you when you read instructions, pamphlets, or other written materials from your doctor or pharmacy?: 1 - Never What is the last grade level you completed in school?: College  Diabetic?No   Interpreter Needed?: No  Information entered by :: Anquanette Bahner,FNP-C   Activities of Daily Living In your present state of health, do you have any difficulty performing the following activities: 01/15/2021  Hearing? N  Vision? N  Difficulty concentrating or making decisions? N  Walking or climbing stairs? Y  Dressing or bathing? N  Doing errands, shopping? N  Preparing Food and eating ? N  Using the Toilet? N  In the past six months, have you accidently leaked urine? N  Do you have problems with loss of bowel control? N  Managing your Medications? N  Managing your Finances? N  Housekeeping or managing your Housekeeping? N  Some recent data might be hidden    Patient Care Team: Analleli Gierke, Nelda Bucks, NP as PCP - General (Family Medicine) Shawnee Knapp, MD as PCP - Family Medicine (Family Medicine) Skeet Latch, MD as PCP - Cardiology (Cardiology) Clent Jacks, MD as Consulting Physician (Ophthalmology)  Indicate any recent Medical Services you may have received from other than [facility] providers in the past year (date may be approximate).     Assessment:   This is a routine wellness examination for Tam[place].  Hearing/Vision screen Hearing Screening - Comments:: No hearing concerns. Patient doesn't wear any hearing aids. Vision Screening - Comments:: No vision concerns. Patient doesn't wear prescription glasses. Patient last eye exam 2020  Dietary issues and exercise activities discussed: Current Exercise Habits: Home exercise routine, Exercise limited by: None  identified   Goals Addressed             This Visit's Progress    Quit smoking / using tobacco   Not on track    Patient states that she wants to try to quit smoking in the near future.        Depression Screen PHQ 2/9 Scores 01/15/2021 01/20/2020 07/07/2019 06/28/2019 06/28/2019 03/29/2019 04/29/2018  PHQ - 2 Score 0 0 0 0 0 0 0  PHQ- 9 Score - - - - - - -    Fall Risk Fall Risk  01/15/2021 07/04/2020 06/19/2020 01/20/2020 07/07/2019  Falls in the past year? 0 0 0 0 0  Number falls in past yr: 0 0 0 0 0  Injury with Fall? 0 0 0 0 0  Risk for fall due to : No Fall Risks - - - -  Follow up Falls evaluation completed - - Falls evaluation completed Falls evaluation completed;Education provided    FALL RISK PREVENTION PERTAINING TO THE HOME:  Any stairs in or around the home? Yes  If so, are there any without handrails? Yes  Home free of loose throw rugs in walkways, pet beds, electrical cords, etc? No  Adequate lighting in your home to reduce risk of falls? Yes   ASSISTIVE DEVICES UTILIZED TO PREVENT FALLS:  Life alert? No  Use of a cane, walker or w/c? No  Grab bars in the bathroom? Yes  Shower chair or bench in shower? Yes  Elevated toilet seat or a handicapped toilet? Yes   TIMED UP AND GO:  Was the test  performed? No .  Length of time to ambulate 10 feet: N/A  sec.   Gait steady and fast without use of assistive device  Cognitive Function:     6CIT Screen 01/15/2021 07/07/2019 03/03/2017  What Year? 0 points 0 points 0 points  What month? 0 points 0 points 0 points  What time? 0 points 0 points 0 points  Count back from 20 0 points 0 points 0 points  Months in reverse 0 points 0 points 0 points  Repeat phrase 0 points 0 points 2 points  Total Score 0 0 2    Immunizations Immunization History  Administered Date(s) Administered   Fluad Quad(high Dose 65+) 02/15/2019   Influenza Split 01/28/2012   Influenza, High Dose Seasonal PF 06/20/2016, 02/10/2018    Influenza,inj,Quad PF,6+ Mos 04/08/2013, 04/15/2014, 01/11/2015, 03/03/2017   Moderna Sars-Covid-2 Vaccination 06/16/2019, 07/19/2019, 04/12/2020   Pneumococcal Conjugate-13 07/26/2015   Pneumococcal Polysaccharide-23 11/14/2010   Td 06/28/2019   Tdap 10/11/2008   Zoster, Live 05/05/2014    TDAP status: Up to date  Flu Vaccine status: Due, Education has been provided regarding the importance of this vaccine. Advised may receive this vaccine at local pharmacy or Health Dept. Aware to provide a copy of the vaccination record if obtained from local pharmacy or Health Dept. Verbalized acceptance and understanding.  Pneumococcal vaccine status: Up to date  Covid-19 vaccine status: Information provided on how to obtain vaccines.   Qualifies for Shingles Vaccine? Yes   Zostavax completed No   Shingrix Completed?: No.    Education has been provided regarding the importance of this vaccine. Patient has been advised to call insurance company to determine out of pocket expense if they have not yet received this vaccine. Advised may also receive vaccine at local pharmacy or Health Dept. Verbalized acceptance and understanding.  Screening Tests Health Maintenance  Topic Date Due   Zoster Vaccines- Shingrix (1 of 2) Never done   OPHTHALMOLOGY EXAM  07/15/2018   FOOT EXAM  06/27/2020   COVID-19 Vaccine (4 - Booster for Moderna series) 08/11/2020   INFLUENZA VACCINE  12/03/2020   HEMOGLOBIN A1C  12/25/2020   COLONOSCOPY (Pts 45-4yr Insurance coverage will need to be confirmed)  06/11/2022   TETANUS/TDAP  06/27/2029   DEXA SCAN  Completed   Hepatitis C Screening  Completed   PNA vac Low Risk Adult  Completed   HPV VACCINES  Aged Out    Health Maintenance  Health Maintenance Due  Topic Date Due   Zoster Vaccines- Shingrix (1 of 2) Never done   OPHTHALMOLOGY EXAM  07/15/2018   FOOT EXAM  06/27/2020   COVID-19 Vaccine (4 - Booster for Moderna series) 08/11/2020   INFLUENZA VACCINE   12/03/2020   HEMOGLOBIN A1C  12/25/2020    Colorectal cancer screening: Type of screening: Colonoscopy. Completed 06/11/2017. Repeat every 5 years  Mammogram status: No longer required due to advance age.  Bone Density status: Ordered 06/19/2020. Pt provided with contact info and advised to call to schedule appt.  Lung Cancer Screening: (Low Dose CT Chest recommended if Age 234-80years, 30 pack-year currently smoking OR have quit w/in 15years.) does notqualify.   Lung Cancer Screening Referral: No   Additional Screening:  Hepatitis C Screening: does qualify; Completed yes   Vision Screening: Recommended annual ophthalmology exams for early detection of glaucoma and other disorders of the eye. Is the patient up to date with their annual eye exam?  No Who is the provider or what is the  name of the office in which the patient attends annual eye exams? Dr.Groat  If pt is not established with a provider, would they like to be referred to a provider to establish care? No .   Dental Screening: Recommended annual dental exams for proper oral hygiene  Community Resource Referral / Chronic Care Management: CRR required this visit?  No   CCM required this visit?  No      Plan:     I have personally reviewed and noted the following in the patient's chart:   Medical and social history Use of alcohol, tobacco or illicit drugs  Current medications and supplements including opioid prescriptions.  Functional ability and status Nutritional status Physical activity Advanced directives List of other physicians Hospitalizations, surgeries, and ER visits in previous 12 months Vitals Screenings to include cognitive, depression, and falls Referrals and appointments  In addition, I have reviewed and discussed with patient certain preventive protocols, quality metrics, and best practice recommendations. A written personalized care plan for preventive services as well as general preventive health  recommendations were provided to patient.     Sandrea Hughs, NP   01/15/2021   Nurse Notes: Advised to get Shingrix ,COVID-19 vaccine at the pharmacy.Will get Flu shot on upcoming visit here at the office.

## 2021-01-15 NOTE — Patient Instructions (Signed)
Terry Armstrong , Thank you for taking time to come for your Medicare Wellness Visit. I appreciate your ongoing commitment to your health goals. Please review the following plan we discussed and let me know if I can assist you in the future.   Screening recommendations/referrals: Colonoscopy : Up to date  Mammogram  Up to date  Bone Density Due  Recommended yearly ophthalmology/optometry visit for glaucoma screening and checkup Recommended yearly dental visit for hygiene and checkup  Vaccinations: Influenza vaccine due  Pneumococcal vaccine : Up to date  Tdap vaccine up to date  Shingles vaccine Due,please get vaccine at your pharmacy    Advanced directives: No   Conditions/risks identified: Advance age female > 76 yrs,Hypertension,dyslipidemia, smoker   Next appointment: 1 year    Preventive Care 76 Years and Older, Female Preventive care refers to lifestyle choices and visits with your health care provider that can promote health and wellness. What does preventive care include? A yearly physical exam. This is also called an annual well check. Dental exams once or twice a year. Routine eye exams. Ask your health care provider how often you should have your eyes checked. Personal lifestyle choices, including: Daily care of your teeth and gums. Regular physical activity. Eating a healthy diet. Avoiding tobacco and drug use. Limiting alcohol use. Practicing safe sex. Taking low-dose aspirin every day. Taking vitamin and mineral supplements as recommended by your health care provider. What happens during an annual well check? The services and screenings done by your health care provider during your annual well check will depend on your age, overall health, lifestyle risk factors, and family history of disease. Counseling  Your health care provider may ask you questions about your: Alcohol use. Tobacco use. Drug use. Emotional well-being. Home and relationship well-being. Sexual  activity. Eating habits. History of falls. Memory and ability to understand (cognition). Work and work Statistician. Reproductive health. Screening  You may have the following tests or measurements: Height, weight, and BMI. Blood pressure. Lipid and cholesterol levels. These may be checked every 5 years, or more frequently if you are over 48 years old. Skin check. Lung cancer screening. You may have this screening every year starting at age 13 if you have a 30-pack-year history of smoking and currently smoke or have quit within the past 15 years. Fecal occult blood test (FOBT) of the stool. You may have this test every year starting at age 38. Flexible sigmoidoscopy or colonoscopy. You may have a sigmoidoscopy every 5 years or a colonoscopy every 10 years starting at age 66. Hepatitis C blood test. Hepatitis B blood test. Sexually transmitted disease (STD) testing. Diabetes screening. This is done by checking your blood sugar (glucose) after you have not eaten for a while (fasting). You may have this done every 1-3 years. Bone density scan. This is done to screen for osteoporosis. You may have this done starting at age 32. Mammogram. This may be done every 1-2 years. Talk to your health care provider about how often you should have regular mammograms. Talk with your health care provider about your test results, treatment options, and if necessary, the need for more tests. Vaccines  Your health care provider may recommend certain vaccines, such as: Influenza vaccine. This is recommended every year. Tetanus, diphtheria, and acellular pertussis (Tdap, Td) vaccine. You may need a Td booster every 10 years. Zoster vaccine. You may need this after age 62. Pneumococcal 13-valent conjugate (PCV13) vaccine. One dose is recommended after age 10. Pneumococcal polysaccharide (  PPSV23) vaccine. One dose is recommended after age 79. Talk to your health care provider about which screenings and vaccines  you need and how often you need them. This information is not intended to replace advice given to you by your health care provider. Make sure you discuss any questions you have with your health care provider. Document Released: 05/18/2015 Document Revised: 01/09/2016 Document Reviewed: 02/20/2015 Elsevier Interactive Patient Education  2017 [employer] Prevention in the Home Falls can cause injuries. They can happen to people of all ages. There are many things you can do to make your home safe and to help prevent falls. What can I do on the outside of my home? Regularly fix the edges of walkways and driveways and fix any cracks. Remove anything that might make you trip as you walk through a door, such as a raised step or threshold. Trim any bushes or trees on the path to your home. Use bright outdoor lighting. Clear any walking paths of anything that might make someone trip, such as rocks or tools. Regularly check to see if handrails are loose or broken. Make sure that both sides of any steps have handrails. Any raised decks and porches should have guardrails on the edges. Have any leaves, snow, or ice cleared regularly. Use sand or salt on walking paths during winter. Clean up any spills in your garage right away. This includes oil or grease spills. What can I do in the bathroom? Use night lights. Install grab bars by the toilet and in the tub and shower. Do not use towel bars as grab bars. Use non-skid mats or decals in the tub or shower. If you need to sit down in the shower, use a plastic, non-slip stool. Keep the floor dry. Clean up any water that spills on the floor as soon as it happens. Remove soap buildup in the tub or shower regularly. Attach bath mats securely with double-sided non-slip rug tape. Do not have throw rugs and other things on the floor that can make you trip. What can I do in the bedroom? Use night lights. Make sure that you have a light by your bed that  is easy to reach. Do not use any sheets or blankets that are too big for your bed. They should not hang down onto the floor. Have a firm chair that has side arms. You can use this for support while you get dressed. Do not have throw rugs and other things on the floor that can make you trip. What can I do in the kitchen? Clean up any spills right away. Avoid walking on wet floors. Keep items that you use a lot in easy-to-reach places. If you need to reach something above you, use a strong step stool that has a grab bar. Keep electrical cords out of the way. Do not use floor polish or wax that makes floors slippery. If you must use wax, use non-skid floor wax. Do not have throw rugs and other things on the floor that can make you trip. What can I do with my stairs? Do not leave any items on the stairs. Make sure that there are handrails on both sides of the stairs and use them. Fix handrails that are broken or loose. Make sure that handrails are as long as the stairways. Check any carpeting to make sure that it is firmly attached to the stairs. Fix any carpet that is loose or worn. Avoid having throw rugs at the top or  bottom of the stairs. If you do have throw rugs, attach them to the floor with carpet tape. Make sure that you have a light switch at the top of the stairs and the bottom of the stairs. If you do not have them, ask someone to add them for you. What else can I do to help prevent falls? Wear shoes that: Do not have high heels. Have rubber bottoms. Are comfortable and fit you well. Are closed at the toe. Do not wear sandals. If you use a stepladder: Make sure that it is fully opened. Do not climb a closed stepladder. Make sure that both sides of the stepladder are locked into place. Ask someone to hold it for you, if possible. Clearly mark and make sure that you can see: Any grab bars or handrails. First and last steps. Where the edge of each step is. Use tools that help you  move around (mobility aids) if they are needed. These include: Canes. Walkers. Scooters. Crutches. Turn on the lights when you go into a dark area. Replace any light bulbs as soon as they burn out. Set up your furniture so you have a clear path. Avoid moving your furniture around. If any of your floors are uneven, fix them. If there are any pets around you, be aware of where they are. Review your medicines with your doctor. Some medicines can make you feel dizzy. This can increase your chance of falling. Ask your doctor what other things that you can do to help prevent falls. This information is not intended to replace advice given to you by your health care provider. Make sure you discuss any questions you have with your health care provider. Document Released: 02/15/2009 Document Revised: 09/27/2015 Document Reviewed: 05/26/2014 Elsevier Interactive Patient Education  2017 [employer].

## 2021-01-16 LAB — CBC WITH DIFFERENTIAL/PLATELET
Absolute Monocytes: 583 cells/uL (ref 200–950)
Basophils Absolute: 37 cells/uL (ref 0–200)
Basophils Relative: 0.6 %
Eosinophils Absolute: 229 cells/uL (ref 15–500)
Eosinophils Relative: 3.7 %
HCT: 42.6 % (ref 35.0–45.0)
Hemoglobin: 14.3 g/dL (ref 11.7–15.5)
Lymphs Abs: 1699 cells/uL (ref 850–3900)
MCH: 32.9 pg (ref 27.0–33.0)
MCHC: 33.6 g/dL (ref 32.0–36.0)
MCV: 98.2 fL (ref 80.0–100.0)
MPV: 10.8 fL (ref 7.5–12.5)
Monocytes Relative: 9.4 %
Neutro Abs: 3652 cells/uL (ref 1500–7800)
Neutrophils Relative %: 58.9 %
Platelets: 254 10*3/uL (ref 140–400)
RBC: 4.34 10*6/uL (ref 3.80–5.10)
RDW: 13 % (ref 11.0–15.0)
Total Lymphocyte: 27.4 %
WBC: 6.2 10*3/uL (ref 3.8–10.8)

## 2021-01-16 LAB — COMPLETE METABOLIC PANEL WITH GFR
AG Ratio: 2.3 (calc) (ref 1.0–2.5)
ALT: 9 U/L (ref 6–29)
AST: 14 U/L (ref 10–35)
Albumin: 4.3 g/dL (ref 3.6–5.1)
Alkaline phosphatase (APISO): 60 U/L (ref 37–153)
BUN: 12 mg/dL (ref 7–25)
CO2: 26 mmol/L (ref 20–32)
Calcium: 9.4 mg/dL (ref 8.6–10.4)
Chloride: 107 mmol/L (ref 98–110)
Creat: 0.62 mg/dL (ref 0.60–1.00)
Globulin: 1.9 g/dL (calc) (ref 1.9–3.7)
Glucose, Bld: 100 mg/dL — ABNORMAL HIGH (ref 65–99)
Potassium: 3.8 mmol/L (ref 3.5–5.3)
Sodium: 141 mmol/L (ref 135–146)
Total Bilirubin: 0.7 mg/dL (ref 0.2–1.2)
Total Protein: 6.2 g/dL (ref 6.1–8.1)
eGFR: 93 mL/min/{1.73_m2} (ref >=60)

## 2021-01-16 LAB — LIPID PANEL
Cholesterol: 169 mg/dL (ref <200)
HDL: 71 mg/dL (ref >=50)
LDL Cholesterol (Calc): 82 mg/dL (calc)
Non-HDL Cholesterol (Calc): 98 mg/dL (calc) (ref <130)
Total CHOL/HDL Ratio: 2.4 (calc) (ref <5.0)
Triglycerides: 78 mg/dL (ref <150)

## 2021-01-16 LAB — HEMOGLOBIN A1C
Hgb A1c MFr Bld: 5.4 % of total Hgb (ref <5.7)
Mean Plasma Glucose: 108 mg/dL
eAG (mmol/L): 6 mmol/L

## 2021-01-18 ENCOUNTER — Ambulatory Visit (INDEPENDENT_AMBULATORY_CARE_PROVIDER_SITE_OTHER): Payer: PPO | Admitting: Family

## 2021-01-18 ENCOUNTER — Other Ambulatory Visit: Payer: Self-pay

## 2021-01-18 ENCOUNTER — Encounter: Payer: Self-pay | Admitting: Family

## 2021-01-18 VITALS — BP 136/84 | HR 81 | Temp 97.6°F | Ht 64.0 in | Wt 110.4 lb

## 2021-01-18 DIAGNOSIS — M810 Age-related osteoporosis without current pathological fracture: Secondary | ICD-10-CM | POA: Diagnosis not present

## 2021-01-18 DIAGNOSIS — E785 Hyperlipidemia, unspecified: Secondary | ICD-10-CM | POA: Diagnosis not present

## 2021-01-18 DIAGNOSIS — F5101 Primary insomnia: Secondary | ICD-10-CM

## 2021-01-18 DIAGNOSIS — R0981 Nasal congestion: Secondary | ICD-10-CM

## 2021-01-18 DIAGNOSIS — Z23 Encounter for immunization: Secondary | ICD-10-CM

## 2021-01-18 DIAGNOSIS — I10 Essential (primary) hypertension: Secondary | ICD-10-CM

## 2021-01-18 DIAGNOSIS — D171 Benign lipomatous neoplasm of skin and subcutaneous tissue of trunk: Secondary | ICD-10-CM

## 2021-01-18 DIAGNOSIS — E119 Type 2 diabetes mellitus without complications: Secondary | ICD-10-CM | POA: Diagnosis not present

## 2021-01-18 DIAGNOSIS — J432 Centrilobular emphysema: Secondary | ICD-10-CM

## 2021-01-18 MED ORDER — TIOTROPIUM BROMIDE MONOHYDRATE 18 MCG IN CAPS: 18.0000 ug | ORAL_CAPSULE | RESPIRATORY_TRACT | 3 refills | Status: DC | PRN

## 2021-01-18 MED ORDER — ATORVASTATIN CALCIUM 40 MG PO TABS: 40.0000 mg | ORAL_TABLET | Freq: Every day | ORAL | 1 refills | Status: DC

## 2021-01-18 MED ORDER — FLUTICASONE PROPIONATE 50 MCG/ACT NA SUSP: 1.0000 | NASAL | 5 refills | Status: DC | PRN

## 2021-01-18 MED ORDER — TRAZODONE HCL 50 MG PO TABS: 25.0000 mg | ORAL_TABLET | Freq: Every evening | ORAL | 3 refills | Status: DC | PRN

## 2021-01-18 NOTE — Patient Instructions (Signed)
Insomnia Insomnia is a sleep disorder that makes it difficult to fall asleep or stay asleep. Insomnia can cause fatigue, low energy, difficulty concentrating, moodswings, and poor performance at work or school. There are three different ways to classify insomnia: Difficulty falling asleep. Difficulty staying asleep. Waking up too early in the morning. Any type of insomnia can be long-term (chronic) or short-term (acute). Both are common. Short-term insomnia usually lasts for three months or less. Chronic insomnia occurs at least three times a week for longer than threemonths. What are the causes? Insomnia may be caused by another condition, situation, or substance, such as: Anxiety. Certain medicines. Gastroesophageal reflux disease (GERD) or other gastrointestinal conditions. Asthma or other breathing conditions. Restless legs syndrome, sleep apnea, or other sleep disorders. Chronic pain. Menopause. Stroke. Abuse of alcohol, tobacco, or illegal drugs. Mental health conditions, such as depression. Caffeine. Neurological disorders, such as Alzheimer's disease. An overactive thyroid (hyperthyroidism). Sometimes, the cause of insomnia may not be known. What increases the risk? Risk factors for insomnia include: Gender. Women are affected more often than men. Age. Insomnia is more common as you get older. Stress. Lack of exercise. Irregular work schedule or working night shifts. Traveling between different time zones. Certain medical and mental health conditions. What are the signs or symptoms? If you have insomnia, the main symptom is having trouble falling asleep or having trouble staying asleep. This may lead to other symptoms, such as: Feeling fatigued or having low energy. Feeling nervous about going to sleep. Not feeling rested in the morning. Having trouble concentrating. Feeling irritable, anxious, or depressed. How is this diagnosed? This condition may be diagnosed based  on: Your symptoms and medical history. Your health care provider may ask about: Your sleep habits. Any medical conditions you have. Your mental health. A physical exam. How is this treated? Treatment for insomnia depends on the cause. Treatment may focus on treating an underlying condition that is causing insomnia. Treatment may also include: Medicines to help you sleep. Counseling or therapy. Lifestyle adjustments to help you sleep better. Follow these instructions at home: Eating and drinking  Limit or avoid alcohol, caffeinated beverages, and cigarettes, especially close to bedtime. These can disrupt your sleep. Do not eat a large meal or eat spicy foods right before bedtime. This can lead to digestive discomfort that can make it hard for you to sleep.  Sleep habits  Keep a sleep diary to help you and your health care provider figure out what could be causing your insomnia. Write down: When you sleep. When you wake up during the night. How well you sleep. How rested you feel the next day. Any side effects of medicines you are taking. What you eat and drink. Make your bedroom a dark, comfortable place where it is easy to fall asleep. Put up shades or blackout curtains to block light from outside. Use a white noise machine to block noise. Keep the temperature cool. Limit screen use before bedtime. This includes: Watching TV. Using your smartphone, tablet, or computer. Stick to a routine that includes going to bed and waking up at the same times every day and night. This can help you fall asleep faster. Consider making a quiet activity, such as reading, part of your nighttime routine. Try to avoid taking naps during the day so that you sleep better at night. Get out of bed if you are still awake after 15 minutes of trying to sleep. Keep the lights down, but try reading or doing a quiet   activity. When you feel sleepy, go back to bed.  General instructions Take over-the-counter  and prescription medicines only as told by your health care provider. Exercise regularly, as told by your health care provider. Avoid exercise starting several hours before bedtime. Use relaxation techniques to manage stress. Ask your health care provider to suggest some techniques that may work well for you. These may include: Breathing exercises. Routines to release muscle tension. Visualizing peaceful scenes. Make sure that you drive carefully. Avoid driving if you feel very sleepy. Keep all follow-up visits as told by your health care provider. This is important. Contact a health care provider if: You are tired throughout the day. You have trouble in your daily routine due to sleepiness. You continue to have sleep problems, or your sleep problems get worse. Get help right away if: You have serious thoughts about hurting yourself or someone else. If you ever feel like you may hurt yourself or others, or have thoughts about taking your own life, get help right away. You can go to your nearest emergency department or call: Your local emergency services (911 in the [place] A suicide crisis helpline, such as the National Suicide Prevention Lifeline at 1-800-273-8255. This is open 24 hours a day. Summary Insomnia is a sleep disorder that makes it difficult to fall asleep or stay asleep. Insomnia can be long-term (chronic) or short-term (acute). Treatment for insomnia depends on the cause. Treatment may focus on treating an underlying condition that is causing insomnia. Keep a sleep diary to help you and your health care provider figure out what could be causing your insomnia. This information is not intended to replace advice given to you by your health care provider. Make sure you discuss any questions you have with your healthcare provider. Document Revised: 03/01/2020 Document Reviewed: 03/01/2020 Elsevier Patient Education  2022 Elsevier Inc.  

## 2021-01-18 NOTE — Progress Notes (Signed)
Provider: Marlowe Sax FNP-C   Terry Armstrong, Nelda Bucks, NP  Patient Care Team: Doriana Mazurkiewicz, Nelda Bucks, NP as PCP - General (Family Medicine) Shawnee Knapp, MD as PCP - Family Medicine (Family Medicine) Skeet Latch, MD as PCP - Cardiology (Cardiology) Clent Jacks, MD as Consulting Physician (Ophthalmology)  Extended Emergency Contact Information Primary Emergency Contact: [facility] Address: [address]          [place], [place] [address] Terry Armstrong of [place] Phone: [phone] Mobile Phone: [phone] Relation: Spouse Secondary Emergency Contact: Terry Armstrong [place] of [place] Phone: [phone] Relation: Relative  Code Status:  Full Code  Goals of care: Advanced Directive information Advanced Directives 01/15/2021  Does Patient Have a Medical Advance Directive? No  Type of Advance Directive -  Does patient want to make changes to medical advance directive? -  Would patient like information on creating a medical advance directive? No - Patient declined     Chief Complaint  Patient presents with   Medical Management of Chronic Issues    Medical Management of Chronic Issues. 6 Month Follow up.    Other    Going to call Dr. Zenia Resides office to schedule Eye Exam Going to call and set up a Mammogram    HPI:  Pt is a 75 y.o. female seen today for 6 months follow up for medical management of chronic diseases.she recently had fasting lab work done which were all unremarkable except glucose was 100 but has improved compared to previous level.Hgb A1C also improved to 5.4  She feels stressed out taking care of her disabled husband and her brother who has brain cancer.she has thought about putting the brother in the [place] but feels bad since he does not have any other family member except her.  States sometimes unable to sleep well at night 3 hrs at times when she has to get up and change the beddings when brother messes up and change him too. Also  concerned of a mass on her right upper back.states not painful or tender."It just right at the bra Line".she would like it  checked out.  She due for Diabetic annual eye.States will call Dr.Groat's office to schedule appointment. Also requires mammogram letter was send by imaging to schedule appointment but has not called yet.plans to schedule soon.    Past Medical History:  Diagnosis Date   Allergy    Breast abscess    Constipation    if drinks alot of water no issues - uses womens stool softener PRN only    Diabetes mellitus without complication ([place])    pt has no meds- diet controlled only    Exertional dyspnea 08/18/2019   H/O mammogram 05/06/2019   Per [facility] new patient packet   Hx of adenomatous colonic polyps 06/19/2017   Hypercholesteremia    Per [facility] new patient packet   Hyperlipidemia    Hypertension    Per [facility] new patient packet   Palpitations 08/18/2019   Primary snoring    Thyroid condition    Per [facility] new patient packet   VT (ventricular tachycardia) ([place]) 09/11/2019   Past Surgical History:  Procedure Laterality Date   ABDOMINAL HYSTERECTOMY  1993   COLONOSCOPY     POLYPECTOMY      No Known Allergies  Allergies as of 01/18/2021   No Known Allergies      Medication List        Accurate as of January 18, 2021 11:59 PM. If you have any  questions, ask your nurse or doctor.          albuterol 108 (90 Base) MCG/ACT inhaler Commonly known as: VENTOLIN HFA INHALE 2 PUFFS INTO THE LUNGS  EVERY 6 HOURS AS NEEDED FOR WHEEZING FOR SHORTNESS OF BREATH   alendronate 70 MG tablet Commonly known as: FOSAMAX Take one tablet once weekly. Take with a full glass of water on an empty stomach.   aspirin EC 81 MG tablet Take 1 tablet (81 mg total) by mouth daily.   atorvastatin 40 MG tablet Commonly known as: LIPITOR Take 1 tablet (40 mg total) by mouth daily.   buPROPion 150 MG 24 hr tablet Commonly known as: WELLBUTRIN XL Take 1 tablet (150 mg total) by mouth  daily.   Debrox 6.5 % OTIC solution Generic drug: carbamide peroxide Place 5 drops into the right ear 2 (two) times daily. Right ear   fluticasone 50 MCG/ACT nasal spray Commonly known as: FLONASE Place 1 spray into both nostrils as needed for allergies or rhinitis.   losartan 25 MG tablet Commonly known as: COZAAR Take 1 tablet (25 mg total) by mouth daily.   metoprolol succinate 25 MG 24 hr tablet Commonly known as: TOPROL-XL Take 25 mg by mouth daily.   multivitamin-iron-minerals-folic acid chewable tablet Chew 1 tablet by mouth daily.   Omega 3 1000 MG Caps Take by mouth daily.   tiotropium 18 MCG inhalation capsule Commonly known as: SPIRIVA Place 1 capsule (18 mcg total) into inhaler and inhale as needed.   traZODone 50 MG tablet Commonly known as: DESYREL Take 0.5-1 tablets (25-50 mg total) by mouth at bedtime as needed for sleep. Started by: Sandrea Hughs, NP        Review of Systems  Constitutional:  Negative for appetite change, chills, fatigue, fever and unexpected weight change.  HENT:  Negative for congestion, dental problem, ear discharge, ear pain, facial swelling, hearing loss, nosebleeds, postnasal drip, rhinorrhea, sinus pressure, sinus pain, sneezing, sore throat, tinnitus and trouble swallowing.   Eyes:  Negative for pain, discharge, redness, itching and visual disturbance.  Respiratory:  Negative for cough, chest tightness, shortness of breath and wheezing.   Cardiovascular:  Negative for chest pain, palpitations and leg swelling.  Gastrointestinal:  Negative for abdominal distention, abdominal pain, blood in stool, constipation, diarrhea, nausea and vomiting.  Endocrine: Negative for cold intolerance, heat intolerance, polydipsia, polyphagia and polyuria.  Genitourinary:  Negative for difficulty urinating, dysuria, flank pain, frequency and urgency.  Musculoskeletal:  Negative for arthralgias, back pain, gait problem, joint swelling, myalgias,  neck pain and neck stiffness.  Skin:  Negative for color change, pallor, rash and wound.       Right upper back mass   Neurological:  Negative for dizziness, syncope, speech difficulty, weakness, light-headedness, numbness and headaches.  Hematological:  Does not bruise/bleed easily.  Psychiatric/Behavioral:  Positive for sleep disturbance. Negative for agitation, behavioral problems, confusion, hallucinations, self-injury and suicidal ideas. The patient is not nervous/anxious.        Increased stress level - care giver to her Husband and her brother    Immunization History  Administered Date(s) Administered   Fluad Quad(high Dose 65+) 02/15/2019   Influenza Split 01/28/2012   Influenza, High Dose Seasonal PF 06/20/2016, 02/10/2018   Influenza,inj,Quad PF,6+ Mos 04/08/2013, 04/15/2014, 01/11/2015, 03/03/2017   Moderna Sars-Covid-2 Vaccination 06/16/2019, 07/19/2019, 04/12/2020   Pneumococcal Conjugate-13 07/26/2015   Pneumococcal Polysaccharide-23 11/14/2010   Td 06/28/2019   Tdap 10/11/2008   Zoster, Live 05/05/2014  Pertinent  Health Maintenance Due  Topic Date Due   OPHTHALMOLOGY EXAM  07/15/2018   INFLUENZA VACCINE  12/03/2020   HEMOGLOBIN A1C  07/15/2021   FOOT EXAM  01/18/2022   COLONOSCOPY (Pts 45-9yr Insurance coverage will need to be confirmed)  06/11/2022   DEXA SCAN  Completed   Fall Risk  01/15/2021 07/04/2020 06/19/2020 01/20/2020 07/07/2019  Falls in the past year? 0 0 0 0 0  Number falls in past yr: 0 0 0 0 0  Injury with Fall? 0 0 0 0 0  Risk for fall due to : No Fall Risks - - - -  Follow up Falls evaluation completed - - Falls evaluation completed Falls evaluation completed;Education provided   Functional Status Survey:    Vitals:   01/18/21 1256  BP: 136/84  Armstrong: 81  Temp: 97.6 F (36.4 C)  TempSrc: Skin  SpO2: 96%  Weight: 110 lb 6.4 oz (50.1 kg)  Height: 5' 4"  (1.626 m)   Body mass index is 18.95 kg/m. Physical Exam Vitals reviewed.   Constitutional:      General: She is not in acute distress.    Appearance: Normal appearance. She is underweight. She is not ill-appearing or diaphoretic.  HENT:     Head: Normocephalic.     Right Ear: Tympanic membrane, ear canal and external ear normal. There is no impacted cerumen.     Left Ear: Tympanic membrane, ear canal and external ear normal. There is no impacted cerumen.     Nose: Nose normal. No congestion or rhinorrhea.     Mouth/Throat:     Mouth: Mucous membranes are moist.     Pharynx: Oropharynx is clear. No oropharyngeal exudate or posterior oropharyngeal erythema.  Eyes:     General: No scleral icterus.       Right eye: No discharge.        Left eye: No discharge.     Extraocular Movements: Extraocular movements intact.     Conjunctiva/sclera: Conjunctivae normal.     Pupils: Pupils are equal, round, and reactive to light.  Neck:     Vascular: No carotid bruit.  Cardiovascular:     Rate and Rhythm: Normal rate and regular rhythm.     Pulses: Normal pulses.     Heart sounds: Normal heart sounds. No murmur heard.   No friction rub. No gallop.  Pulmonary:     Effort: Pulmonary effort is normal. No respiratory distress.     Breath sounds: Normal breath sounds. No wheezing, rhonchi or rales.  Chest:     Chest wall: No tenderness.  Abdominal:     General: Bowel sounds are normal. There is no distension.     Palpations: Abdomen is soft. There is no mass.     Tenderness: There is no abdominal tenderness. There is no right CVA tenderness, left CVA tenderness, guarding or rebound.  Musculoskeletal:        General: No swelling or tenderness. Normal range of motion.     Cervical back: Normal range of motion. No rigidity or tenderness.     Right lower leg: No edema.     Left lower leg: No edema.  Lymphadenopathy:     Cervical: No cervical adenopathy.  Skin:    General: Skin is warm and dry.     Coloration: Skin is not pale.     Findings: No bruising, erythema,  lesion or rash.     Comments: Right upper back golf ball size soft mass non tender  to palpation and without any erythema or drainage.   Neurological:     Mental Status: She is alert and oriented to person, place, and time.     Cranial Nerves: No cranial nerve deficit.     Sensory: No sensory deficit.     Motor: No weakness.     Coordination: Coordination normal.     Gait: Gait normal.  Psychiatric:        Mood and Affect: Mood normal.        Speech: Speech normal.        Behavior: Behavior normal.        Thought Content: Thought content normal.        Judgment: Judgment normal.    Labs reviewed: Recent Labs    06/27/20 0833 01/15/21 0806  NA 141 141  K 4.7 3.8  CL 108 107  CO2 25 26  GLUCOSE 95 100*  BUN 12 12  CREATININE 0.55* 0.62  CALCIUM 9.3 9.4   Recent Labs    06/27/20 0833 01/15/21 0806  AST 15 14  ALT 16 9  BILITOT 0.5 0.7  PROT 6.1 6.2   Recent Labs    06/27/20 0833 01/15/21 0806  WBC 6.7 6.2  NEUTROABS 4,147 3,652  HGB 13.9 14.3  HCT 40.2 42.6  MCV 96.2 98.2  PLT 270 254   Lab Results  Component Value Date   TSH 0.39 (L) 06/27/2020   Lab Results  Component Value Date   HGBA1C 5.4 01/15/2021   Lab Results  Component Value Date   CHOL 169 01/15/2021   HDL 71 01/15/2021   LDLCALC 82 01/15/2021   LDLDIRECT 81 04/29/2018   TRIG 78 01/15/2021   CHOLHDL 2.4 01/15/2021    Significant Diagnostic Results in last 30 days:  No results found.  Assessment/Plan  1. Need for influenza vaccination Afebrile. Flut shot administered by CMA no acute reaction reported.   2. Hyperlipidemia LDL goal <70 LDL at goal  Continue on atorvastatin,dietary modification and exercise   - atorvastatin (LIPITOR) 40 MG tablet; Take 1 tablet (40 mg total) by mouth daily.  Dispense: 90 tablet; Refill: 1 - Lipid panel; Future  3. Type 2 diabetes mellitus without complication, without long-term current use of insulin ([facility]) Lab Results  Component Value Date    HGBA1C 5.4 01/15/2021  Well controlled. - continue on ASA,BBB,ARB and statin  - Follow up with Ophthalmology for annual eye exam  - TSH; Future - Hemoglobin A1c; Future  4. Essential hypertension B/p at goal  Continue on metoprolol,losartan  - CBC with Differential/Platelet; Future - CMP with eGFR([facility]); Future  5. Centrilobular emphysema ([facility]) Breathing stable Continue on Spiriva and Albuterol  - tiotropium (SPIRIVA) 18 MCG inhalation capsule; Place 1 capsule (18 mcg total) into inhaler and inhale as needed.  Dispense: 30 capsule; Refill: 3  6. Age-related osteoporosis without current pathological fracture Continue on alendronate   7. Primary insomnia Start on trazodone as below  - traZODone (DESYREL) 50 MG tablet; Take 0.5-1 tablets (25-50 mg total) by mouth at bedtime as needed for sleep.  Dispense: 30 tablet; Refill: 3 - Additional education information provided on AVS  8. Nasal congestion Continue on Flonase  - fluticasone (FLONASE) 50 MCG/ACT nasal spray; Place 1 spray into both nostrils as needed for allergies or rhinitis.  Dispense: 15.8 mL; Refill: 5  9. Lipoma of back Right upper back golf ball size soft mass non tender to palpation and without any erythema or drainage.suspect possible lipoma  -  Ambulatory referral to General Surgery  Family/ staff Communication: Reviewed plan of care with patient verbalized understanding.  Labs/tests ordered:  - CBC with Differential/Platelet - CMP with eGFR([facility]) - TSH - Hgb A1C - Lipid panel  Next Appointment : 6 months for medical management of chronic issues.   Sandrea Hughs, NP

## 2021-04-08 ENCOUNTER — Other Ambulatory Visit: Payer: Self-pay | Admitting: Physician Assistant

## 2021-04-08 NOTE — Telephone Encounter (Signed)
This is Dr. Atwater's pt.  °

## 2021-06-18 ENCOUNTER — Other Ambulatory Visit: Payer: Self-pay | Admitting: *Deleted

## 2021-06-18 DIAGNOSIS — F17210 Nicotine dependence, cigarettes, uncomplicated: Secondary | ICD-10-CM

## 2021-06-18 DIAGNOSIS — Z87891 Personal history of nicotine dependence: Secondary | ICD-10-CM

## 2021-07-08 ENCOUNTER — Inpatient Hospital Stay: Admission: RE | Admit: 2021-07-08 | Payer: PPO | Source: Ambulatory Visit

## 2021-07-23 ENCOUNTER — Other Ambulatory Visit: Payer: PPO

## 2021-07-23 DIAGNOSIS — I10 Essential (primary) hypertension: Secondary | ICD-10-CM

## 2021-07-23 DIAGNOSIS — E119 Type 2 diabetes mellitus without complications: Secondary | ICD-10-CM

## 2021-07-23 DIAGNOSIS — E785 Hyperlipidemia, unspecified: Secondary | ICD-10-CM

## 2021-07-26 ENCOUNTER — Other Ambulatory Visit: Payer: Self-pay

## 2021-07-26 ENCOUNTER — Encounter: Payer: Self-pay | Admitting: Family

## 2021-07-26 ENCOUNTER — Ambulatory Visit (INDEPENDENT_AMBULATORY_CARE_PROVIDER_SITE_OTHER): Payer: PPO | Admitting: Family

## 2021-07-26 VITALS — BP 123/80 | HR 73 | Temp 97.8°F | Ht 64.0 in | Wt 104.8 lb

## 2021-07-26 DIAGNOSIS — I10 Essential (primary) hypertension: Secondary | ICD-10-CM | POA: Diagnosis not present

## 2021-07-26 DIAGNOSIS — R634 Abnormal weight loss: Secondary | ICD-10-CM

## 2021-07-26 DIAGNOSIS — M810 Age-related osteoporosis without current pathological fracture: Secondary | ICD-10-CM

## 2021-07-26 DIAGNOSIS — E785 Hyperlipidemia, unspecified: Secondary | ICD-10-CM | POA: Diagnosis not present

## 2021-07-26 DIAGNOSIS — F5101 Primary insomnia: Secondary | ICD-10-CM

## 2021-07-26 DIAGNOSIS — E119 Type 2 diabetes mellitus without complications: Secondary | ICD-10-CM | POA: Diagnosis not present

## 2021-07-26 DIAGNOSIS — J432 Centrilobular emphysema: Secondary | ICD-10-CM | POA: Diagnosis not present

## 2021-07-26 MED ORDER — MIRTAZAPINE 7.5 MG PO TABS: 7.5000 mg | ORAL_TABLET | Freq: Every day | ORAL | 3 refills | Status: DC

## 2021-07-26 MED ORDER — ALBUTEROL SULFATE HFA 108 (90 BASE) MCG/ACT IN AERS: INHALATION_SPRAY | RESPIRATORY_TRACT | 0 refills | Status: DC

## 2021-07-26 NOTE — Progress Notes (Signed)
? ?Provider: Marlowe Sax FNP-C  ? ?Deaglan Lile, Nelda Bucks, NP ? ?Patient Care Team: ?Cleven Jansma, Nelda Bucks, NP as PCP - General (Family Medicine) ?Shawnee Knapp, MD as PCP - Family Medicine (Family Medicine) ?Skeet Latch, MD as PCP - Cardiology (Cardiology) ?Clent Jacks, MD as Consulting Physician (Ophthalmology) ? ?Extended Emergency Contact Information ?Primary Emergency Contact: Setters,Elton ?Address: [address] ?         [place], [place] [address] [place] ?Home Phone: [phone] ?Mobile Phone: [phone] ?Relation: Spouse ?Secondary Emergency Contact: Atchley,Barbara ? [place] ?Home Phone: [phone] ?Relation: Relative ? ?Code Status:  Full Code  ?Goals of care: Advanced Directive information ? ?  07/26/2021  ? 11:28 AM  ?Advanced Directives  ?Does Patient Have a Medical Advance Directive? No  ?Would patient like information on creating a medical advance directive? No - Patient declined  ? ? ? ?Chief Complaint  ?Patient presents with  ? Medical Management of Chronic Issues  ?  6 month follow up.Patient complains of hand itching and having shortness of breath. Patient has been out of albuterol for about a couple of months. She also has lightheadedness and states being under a lot of stress.Would like something to calm her down.Not taking metoprolol feels she doesn't need it.  ? Health Maintenance  ?  Discuss the need for Hemoglobin A1C, and Eye exam.  ? Immunizations  ?  Discuss the need for Shingrix vaccine, Influenza vaccine, and Covid Booster.  ? ? ?HPI:  ?Pt is a 76 y.o. female seen today for 6 months follow-up medical management of chronic diseases. ? ?She requests refills for albuterol status due to change in season has had some shortness of breath.She denies any fever,chills,cough,fatigue,body aches,runny nose,chest tightness,chest pain or palpitation ? ?Has had progressive weight loss. Eats one meal per day.Has had 10 lbs weight loss since last seen.States not eating  well feels stressed out from taking care of her husband and her brother. She states that her brother is currently in the rehab still has to go visit take stuff that he needs in the Rehab.Has to cook and assist both with medication and other ADL's.Hopes brother will be discharged in 2 weeks with someone to help with his medication and ADL's.Otherwise she might need to put him in the Nursing Home." Can't do this anymore " burning out.  ?Dose sleep at night unless she takes Trazodone as needed.  ? ? ?Past Medical History:  ?Diagnosis Date  ? Allergy   ? Breast abscess   ? Constipation   ? if drinks alot of water no issues - uses womens stool softener PRN only   ? Diabetes mellitus without complication ([place])   ? pt has no meds- diet controlled only   ? Exertional dyspnea 08/18/2019  ? H/O mammogram 05/06/2019  ? Per [place] new patient packet  ? Hx of adenomatous colonic polyps 06/19/2017  ? Hypercholesteremia   ? Per [facility] new patient packet  ? Hyperlipidemia   ? Hypertension   ? Per [place] new patient packet  ? Palpitations 08/18/2019  ? Primary snoring   ? Thyroid condition   ? Per [place] new patient packet  ? VT (ventricular tachycardia) 09/11/2019  ? ?Past Surgical History:  ?Procedure Laterality Date  ? ABDOMINAL HYSTERECTOMY  1993  ? COLONOSCOPY    ? POLYPECTOMY    ? ? ?No Known Allergies ? ?Allergies as of 07/26/2021   ?No Known Allergies ?  ? ?  ?Medication List  ?  ? ?  ?  Accurate as of July 26, 2021  1:06 PM. If you have any questions, ask your nurse or doctor.  ?  ?  ? ?  ? ?STOP taking these medications   ? ?Debrox 6.5 % OTIC solution ?Generic drug: carbamide peroxide ?Stopped by: Sandrea Hughs, NP ?  ? ?  ? ?TAKE these medications   ? ?albuterol 108 (90 Base) MCG/ACT inhaler ?Commonly known as: VENTOLIN HFA ?INHALE 2 PUFFS INTO THE LUNGS  EVERY 6 HOURS AS NEEDED FOR WHEEZING FOR SHORTNESS OF BREATH ?  ?alendronate 70 MG tablet ?Commonly known as: FOSAMAX ?Take one tablet once weekly. Take with a full glass of water on an  empty stomach. ?  ?aspirin EC 81 MG tablet ?Take 1 tablet (81 mg total) by mouth daily. ?  ?atorvastatin 40 MG tablet ?Commonly known as: LIPITOR ?Take 1 tablet (40 mg total) by mouth daily. ?  ?buPROPion 150 MG 24 hr tablet ?Commonly known as: WELLBUTRIN XL ?Take 1 tablet (150 mg total) by mouth daily. ?  ?fluticasone 50 MCG/ACT nasal spray ?Commonly known as: FLONASE ?Place 1 spray into both nostrils as needed for allergies or rhinitis. ?  ?losartan 25 MG tablet ?Commonly known as: COZAAR ?Take 1 tablet (25 mg total) by mouth daily. ?  ?metoprolol succinate 25 MG 24 hr tablet ?Commonly known as: TOPROL-XL ?Take 1 tablet by mouth once daily ?  ?multivitamin-iron-minerals-folic acid chewable tablet ?Chew 1 tablet by mouth daily. ?  ?Omega 3 1000 MG Caps ?Take by mouth daily. ?  ?tiotropium 18 MCG inhalation capsule ?Commonly known as: SPIRIVA ?Place 1 capsule (18 mcg total) into inhaler and inhale as needed. ?  ?traZODone 50 MG tablet ?Commonly known as: DESYREL ?Take 0.5-1 tablets (25-50 mg total) by mouth at bedtime as needed for sleep. ?  ? ?  ? ? ?Review of Systems  ?Constitutional:  Negative for appetite change, chills, fatigue, fever and unexpected weight change.  ?HENT:  Negative for congestion, dental problem, ear discharge, ear pain, facial swelling, hearing loss, nosebleeds, postnasal drip, rhinorrhea, sinus pressure, sinus pain, sneezing, sore throat, tinnitus and trouble swallowing.   ?Eyes:  Negative for pain, discharge, redness, itching and visual disturbance.  ?Respiratory:  Negative for cough, chest tightness, shortness of breath and wheezing.   ?Cardiovascular:  Negative for chest pain, palpitations and leg swelling.  ?Gastrointestinal:  Negative for abdominal distention, abdominal pain, blood in stool, constipation, diarrhea, nausea and vomiting.  ?Endocrine: Negative for cold intolerance, heat intolerance, polydipsia, polyphagia and polyuria.  ?Genitourinary:  Negative for difficulty urinating,  dysuria, flank pain, frequency and urgency.  ?Musculoskeletal:  Negative for arthralgias, back pain, gait problem, joint swelling, myalgias, neck pain and neck stiffness.  ?Skin:  Negative for color change, pallor, rash and wound.  ?Neurological:  Negative for dizziness, syncope, speech difficulty, weakness, light-headedness, numbness and headaches.  ?Hematological:  Does not bruise/bleed easily.  ?Psychiatric/Behavioral:  Negative for agitation, behavioral problems, confusion, hallucinations, self-injury, sleep disturbance and suicidal ideas. The patient is not nervous/anxious.   ?     Increased stress level - care giver burden   ? ?Immunization History  ?Administered Date(s) Administered  ? Fluad Quad(high Dose 65+) 02/15/2019  ? Influenza Split 01/28/2012  ? Influenza, High Dose Seasonal PF 06/20/2016, 02/10/2018  ? Influenza,inj,Quad PF,6+ Mos 04/08/2013, 04/15/2014, 01/11/2015, 03/03/2017  ? Moderna Sars-Covid-2 Vaccination 06/16/2019, 07/19/2019, 04/12/2020  ? Pneumococcal Conjugate-13 07/26/2015  ? Pneumococcal Polysaccharide-23 11/14/2010  ? Td 06/28/2019  ? Tdap 10/11/2008  ? Zoster, Live 05/05/2014  ? ?  Pertinent  Health Maintenance Due  ?Topic Date Due  ? OPHTHALMOLOGY EXAM  07/15/2018  ? INFLUENZA VACCINE  12/03/2020  ? HEMOGLOBIN A1C  07/15/2021  ? FOOT EXAM  01/18/2022  ? COLONOSCOPY (Pts 45-62yr Insurance coverage will need to be confirmed)  06/11/2022  ? DEXA SCAN  Completed  ? ? ?  01/20/2020  ?  8:00 AM 06/19/2020  ?  1:24 PM 07/04/2020  ?  1:09 PM 01/15/2021  ?  4:33 PM 07/26/2021  ? 11:28 AM  ?Fall Risk  ?Falls in the past year? 0 0 0 0 0  ?Was there an injury with Fall? 0 0 0 0 0  ?Fall Risk Category Calculator 0 0 0 0 0  ?Fall Risk Category Low Low Low Low Low  ?Patient Fall Risk Level  Low fall risk Low fall risk Low fall risk Low fall risk  ?Patient at Risk for Falls Due to    No Fall Risks No Fall Risks  ?Fall risk Follow up Falls evaluation completed   Falls evaluation completed Falls evaluation  completed  ? ?Functional Status Survey: ?  ? ?Vitals:  ? 07/26/21 1251  ?BP: 123/80  ?Pulse: 73  ?Temp: 97.8 ?F (36.6 ?C)  ?SpO2: 94%  ?Weight: 104 lb 12.8 oz (47.5 kg)  ?Height: '5\' 4"'$  (1.626 m)  ? ?Bod

## 2021-07-27 LAB — COMPLETE METABOLIC PANEL WITH GFR
AG Ratio: 2 (calc) (ref 1.0–2.5)
ALT: 21 U/L (ref 6–29)
AST: 22 U/L (ref 10–35)
Albumin: 4.5 g/dL (ref 3.6–5.1)
Alkaline phosphatase (APISO): 69 U/L (ref 37–153)
BUN: 13 mg/dL (ref 7–25)
CO2: 27 mmol/L (ref 20–32)
Calcium: 9.7 mg/dL (ref 8.6–10.4)
Chloride: 105 mmol/L (ref 98–110)
Creat: 0.69 mg/dL (ref 0.60–1.00)
Globulin: 2.2 g/dL (calc) (ref 1.9–3.7)
Glucose, Bld: 76 mg/dL (ref 65–139)
Potassium: 4.4 mmol/L (ref 3.5–5.3)
Sodium: 140 mmol/L (ref 135–146)
Total Bilirubin: 0.5 mg/dL (ref 0.2–1.2)
Total Protein: 6.7 g/dL (ref 6.1–8.1)
eGFR: 90 mL/min/{1.73_m2} (ref >=60)

## 2021-07-27 LAB — CBC WITH DIFFERENTIAL/PLATELET
Absolute Monocytes: 628 cells/uL (ref 200–950)
Basophils Absolute: 48 cells/uL (ref 0–200)
Basophils Relative: 0.7 %
Eosinophils Absolute: 166 cells/uL (ref 15–500)
Eosinophils Relative: 2.4 %
HCT: 42.3 % (ref 35.0–45.0)
Hemoglobin: 14 g/dL (ref 11.7–15.5)
Lymphs Abs: 1891 cells/uL (ref 850–3900)
MCH: 32.6 pg (ref 27.0–33.0)
MCHC: 33.1 g/dL (ref 32.0–36.0)
MCV: 98.6 fL (ref 80.0–100.0)
MPV: 11.2 fL (ref 7.5–12.5)
Monocytes Relative: 9.1 %
Neutro Abs: 4168 cells/uL (ref 1500–7800)
Neutrophils Relative %: 60.4 %
Platelets: 254 10*3/uL (ref 140–400)
RBC: 4.29 10*6/uL (ref 3.80–5.10)
RDW: 12.6 % (ref 11.0–15.0)
Total Lymphocyte: 27.4 %
WBC: 6.9 10*3/uL (ref 3.8–10.8)

## 2021-07-27 LAB — LIPID PANEL
Cholesterol: 175 mg/dL (ref <200)
HDL: 87 mg/dL (ref >=50)
LDL Cholesterol (Calc): 75 mg/dL (calc)
Non-HDL Cholesterol (Calc): 88 mg/dL (calc) (ref <130)
Total CHOL/HDL Ratio: 2 (calc) (ref <5.0)
Triglycerides: 57 mg/dL (ref <150)

## 2021-07-27 LAB — HEMOGLOBIN A1C
Hgb A1c MFr Bld: 5.6 % of total Hgb (ref <5.7)
Mean Plasma Glucose: 114 mg/dL
eAG (mmol/L): 6.3 mmol/L

## 2021-07-27 LAB — TSH: TSH: 0.14 mIU/L — ABNORMAL LOW (ref 0.40–4.50)

## 2021-08-02 ENCOUNTER — Other Ambulatory Visit: Payer: Self-pay | Admitting: Family

## 2021-08-02 DIAGNOSIS — R7989 Other specified abnormal findings of blood chemistry: Secondary | ICD-10-CM

## 2021-08-02 DIAGNOSIS — E042 Nontoxic multinodular goiter: Secondary | ICD-10-CM

## 2022-01-07 NOTE — Progress Notes (Signed)
Nice to meet you!  Use Spiriva 1 puff once a day for the next month. See if this helps your symptoms of shortness of breath with activity. If so, please continue this every day.   Send me a message in a month or two and let me know if it is helping.  We will see you back in 6 months. Call and make an appointment if you need to see [place] sooner!

## 2022-01-16 ENCOUNTER — Encounter: Payer: Self-pay | Admitting: Emergency Medicine

## 2022-01-16 ENCOUNTER — Other Ambulatory Visit: Payer: Self-pay

## 2022-01-16 ENCOUNTER — Ambulatory Visit
Admission: EM | Admit: 2022-01-16 | Discharge: 2022-01-16 | Disposition: A | Discharge status: Home / Self Care | Payer: PPO | Attending: Internal Medicine | Admitting: Internal Medicine

## 2022-01-16 DIAGNOSIS — H6121 Impacted cerumen, right ear: Secondary | ICD-10-CM | POA: Diagnosis not present

## 2022-01-16 DIAGNOSIS — H9201 Otalgia, right ear: Secondary | ICD-10-CM

## 2022-01-16 MED ORDER — FLUTICASONE PROPIONATE 50 MCG/ACT NA SUSP: 1.0000 | Freq: Every day | NASAL | 0 refills | Status: DC

## 2022-01-16 NOTE — ED Triage Notes (Signed)
Pt here for right ear fullness x several days

## 2022-01-16 NOTE — Discharge Instructions (Signed)
It appears that you may have fluid in your eardrum.  This is being treated with the Flonase nasal spray.  Please follow-up if symptoms persist or worsen.

## 2022-01-16 NOTE — ED Provider Notes (Signed)
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   Patient presents with feelings of right ear fullness that has been present for a few days.  Denies right ear pain, trauma, foreign body, drainage from the ear but does endorse some decreased hearing in the right ear.  Patient reports that she bought an over-the-counter earwax removal kit that did not improve symptoms.  Denies any associated upper respiratory symptoms or fever.   Ear Fullness    Past Medical History:  Diagnosis Date   Allergy    Breast abscess    Constipation    if drinks alot of water no issues - uses womens stool softener PRN only    Diabetes mellitus without complication ([place])    pt has no meds- diet controlled only    Exertional dyspnea 08/18/2019   H/O mammogram 05/06/2019   Per [place] new patient packet   Hx of adenomatous colonic polyps 06/19/2017   Hypercholesteremia    Per [place] new patient packet   Hyperlipidemia    Hypertension    Per [facility] new patient packet   Palpitations 08/18/2019   Primary snoring    Thyroid condition    Per [facility] new patient packet   VT (ventricular tachycardia) ([place]) 09/11/2019    Patient Active Problem List   Diagnosis Date Noted   Aortic atherosclerosis ([place]) 06/19/2020   VT (ventricular tachycardia) ([place]) 09/11/2019   Exertional dyspnea 08/18/2019   Palpitations 08/18/2019   Hx of adenomatous colonic polyps 06/19/2017   Multinodular goiter 03/15/2017   Centrilobular emphysema ([place]) 11/09/2016   Periodic limb movement sleep disorder 01/11/2015   Abnormal TSH 01/11/2015   Upper airway resistance syndrome 01/11/2015   Mild obstructive sleep apnea 01/11/2015   Osteoporosis 01/10/2015   Non-restorative sleep 05/23/2013   Primary snoring    HTN (hypertension) 05/11/2013   DM (diabetes mellitus) ([place]) 06/04/2011    Tobacco user 06/04/2011   Hyperlipidemia LDL goal <70 06/04/2011    Past Surgical History:  Procedure Laterality Date   ABDOMINAL HYSTERECTOMY  1993   COLONOSCOPY     POLYPECTOMY      OB History   No obstetric history on file.      Home Medications    Prior to Admission medications   Medication Sig Start Date End Date Taking? Authorizing Provider  fluticasone (FLONASE) 50 MCG/ACT nasal spray Place 1 spray into both nostrils daily. 01/16/22  Yes Tomesha Sargent, Hildred Alamin E, FNP  metoprolol succinate (TOPROL-XL) 25 MG 24 hr tablet Take 1 tablet by mouth once daily Patient not taking: Reported on 07/26/2021 04/08/21   Skeet Latch, MD  albuterol (VENTOLIN HFA) 108 (90 Base) MCG/ACT inhaler INHALE 2 PUFFS INTO THE LUNGS  EVERY 6 HOURS AS NEEDED FOR WHEEZING FOR SHORTNESS OF BREATH 07/26/21   Ngetich, Dinah C, NP  alendronate (FOSAMAX) 70 MG tablet Take one tablet once weekly. Take with a full glass of water on an empty stomach. 06/19/20   Ngetich, Dinah C, NP  aspirin EC 81 MG tablet Take 1 tablet (81 mg total) by mouth daily. 06/28/19   Forrest Moron, MD  atorvastatin (LIPITOR) 40 MG tablet Take 1 tablet (40 mg total) by mouth daily. 01/18/21   Ngetich, Dinah C, NP  buPROPion (WELLBUTRIN XL) 150 MG 24 hr tablet Take 1  tablet (150 mg total) by mouth daily. 06/19/20   Ngetich, Dinah C, NP  losartan (COZAAR) 25 MG tablet Take 1 tablet (25 mg total) by mouth daily. 06/19/20   Ngetich, Dinah C, NP  mirtazapine (REMERON) 7.5 MG tablet Take 1 tablet (7.5 mg total) by mouth at bedtime. 07/26/21 11/23/21  Ngetich, Dinah C, NP  multivitamin-iron-minerals-folic acid (CENTRUM) chewable tablet Chew 1 tablet by mouth daily.    [provider]  Omega 3 1000 MG CAPS Take by mouth daily.    [provider]  tiotropium (SPIRIVA) 18 MCG inhalation capsule Place 1 capsule (18 mcg total) into inhaler and inhale as needed. 01/18/21   Ngetich, Nelda Bucks, NP    Family History Family History  Problem  Relation Age of Onset   Diabetes Mother    Diabetes Father    Diabetes Other    Hypertension Sister    Hyperlipidemia Other    Heart disease Sister        defibrillator   Kidney failure Maternal Aunt        dialysis   Diabetes Maternal Aunt    Hypertension Maternal Aunt    Diabetes Maternal Aunt    Heart Problems Maternal Grandmother    Cancer Maternal Grandmother    Heart disease Maternal Grandmother    Hypertension Maternal Grandmother    Diabetes Brother    Colon cancer Neg Hx    Colon polyps Neg Hx    Rectal cancer Neg Hx    Stomach cancer Neg Hx     Social History Social History   Tobacco Use   Smoking status: Every Day    Packs/day: 1.50    Years: 49.00    Total pack years: 73.50    Types: Cigarettes   Smokeless tobacco: Never   Tobacco comments:    1/2 ppd 01/16/20  Vaping Use   Vaping Use: Never used  Substance Use Topics   Alcohol use: Yes    Comment: 2-4 per week   Drug use: No     Allergies   Patient has no known allergies.   Review of Systems Review of Systems Per HPI  Physical Exam Triage Vital Signs ED Triage Vitals [01/16/22 1436]  Enc Vitals Group     BP (!) 143/64     Pulse Rate 92     Resp 18     Temp 97.9 F (36.6 C)     Temp Source Oral     SpO2 98 %     Weight      Height      Head Circumference      Peak Flow      Pain Score 0     Pain Loc      Pain Edu?      Excl. in [place]?    No data found.  Updated Vital Signs BP (!) 143/64 (BP Location: Left Arm)   Pulse 92   Temp 97.9 F (36.6 C) (Oral)   Resp 18   SpO2 98%   Visual Acuity Right Eye Distance:   Left Eye Distance:   Bilateral Distance:    Right Eye Near:   Left Eye Near:    Bilateral Near:     Physical Exam HENT:     Right Ear: External ear normal. No drainage, swelling or tenderness. A middle ear effusion is present. There is impacted cerumen. No foreign body. No mastoid tenderness. Tympanic membrane is not perforated, erythematous or bulging.      Ears:  Comments: Cerumen in ear canal on original physical exam.  Ear was irrigated with majority of cerumen being removed.  Able to visualize most of TM which appears intact with mild middle ear effusion with no bulging.     UC Treatments / Results  Labs (all labs ordered are listed, but only abnormal results are displayed) Labs Reviewed - No data to display  EKG   Radiology No results found.  Procedures Procedures (including critical care time)  Medications Ordered in UC Medications - No data to display  Initial Impression / Assessment and Plan / UC Course  I have reviewed the triage vital signs and the nursing notes.  Pertinent labs & imaging results that were available during my care of the patient were reviewed by me and considered in my medical decision making (see chart for details).     Patient had cerumen blocking eardrum on original physical exam.  Ear was irrigated with removal of cerumen.  TM was able to be mostly visualized.  TM appears intact and not erythematous.  There does appear to be mild fluid behind eardrum.  Will treat with Flonase.  Patient reports that she already has Zyrtec and has been taking for the past 2 days.  Given strict return precautions.  Patient verbalized understanding and was agreeable with plan. Final Clinical Impressions(s) / UC Diagnoses   Final diagnoses:  Discomfort of right ear  Impacted cerumen, right ear     Discharge Instructions      It appears that you may have fluid in your eardrum.  This is being treated with the Flonase nasal spray.  Please follow-up if symptoms persist or worsen.     ED Prescriptions     Medication Sig Dispense Auth. Provider   fluticasone (FLONASE) 50 MCG/ACT nasal spray Place 1 spray into both nostrils daily. 16 g Teodora Medici, [place]      PDMP not reviewed this encounter.   Teodora Medici, [place] 01/16/22 1515

## 2022-01-20 ENCOUNTER — Encounter: Payer: PPO | Admitting: Family

## 2022-01-21 ENCOUNTER — Encounter: Payer: Self-pay | Admitting: Emergency Medicine

## 2022-01-21 ENCOUNTER — Ambulatory Visit (INDEPENDENT_AMBULATORY_CARE_PROVIDER_SITE_OTHER): Payer: PPO

## 2022-01-21 ENCOUNTER — Ambulatory Visit (INDEPENDENT_AMBULATORY_CARE_PROVIDER_SITE_OTHER): Payer: PPO | Admitting: Emergency Medicine

## 2022-01-21 VITALS — BP 132/78 | HR 115 | Temp 98.0°F | Ht 64.0 in | Wt 97.5 lb

## 2022-01-21 DIAGNOSIS — J432 Centrilobular emphysema: Secondary | ICD-10-CM

## 2022-01-21 DIAGNOSIS — Z7689 Persons encountering health services in other specified circumstances: Secondary | ICD-10-CM

## 2022-01-21 DIAGNOSIS — R7989 Other specified abnormal findings of blood chemistry: Secondary | ICD-10-CM

## 2022-01-21 DIAGNOSIS — Z23 Encounter for immunization: Secondary | ICD-10-CM

## 2022-01-21 DIAGNOSIS — R634 Abnormal weight loss: Secondary | ICD-10-CM

## 2022-01-21 DIAGNOSIS — F4323 Adjustment disorder with mixed anxiety and depressed mood: Secondary | ICD-10-CM | POA: Diagnosis not present

## 2022-01-21 DIAGNOSIS — M810 Age-related osteoporosis without current pathological fracture: Secondary | ICD-10-CM

## 2022-01-21 DIAGNOSIS — F17200 Nicotine dependence, unspecified, uncomplicated: Secondary | ICD-10-CM

## 2022-01-21 DIAGNOSIS — Z720 Tobacco use: Secondary | ICD-10-CM

## 2022-01-21 DIAGNOSIS — I10 Essential (primary) hypertension: Secondary | ICD-10-CM

## 2022-01-21 DIAGNOSIS — F4321 Adjustment disorder with depressed mood: Secondary | ICD-10-CM

## 2022-01-21 LAB — URINALYSIS, ROUTINE W REFLEX MICROSCOPIC
Hgb urine dipstick: NEGATIVE
Leukocytes,Ua: NEGATIVE
Nitrite: NEGATIVE
Specific Gravity, Urine: >=1.03 — AB (ref 1.000–1.030)
Total Protein, Urine: 100 — AB
Urine Glucose: NEGATIVE
Urobilinogen, UA: 1 (ref 0.0–1.0)
pH: 6 (ref 5.0–8.0)

## 2022-01-21 LAB — LIPID PANEL
Cholesterol: 209 mg/dL — ABNORMAL HIGH (ref 0–200)
HDL: 93.2 mg/dL (ref >39.00)
LDL Cholesterol: 104 mg/dL — ABNORMAL HIGH (ref 0–99)
NonHDL: 115.79
Total CHOL/HDL Ratio: 2
Triglycerides: 60 mg/dL (ref 0.0–149.0)
VLDL: 12 mg/dL (ref 0.0–40.0)

## 2022-01-21 LAB — COMPREHENSIVE METABOLIC PANEL
ALT: 12 U/L (ref 0–35)
AST: 18 U/L (ref 0–37)
Albumin: 4.2 g/dL (ref 3.5–5.2)
Alkaline Phosphatase: 63 U/L (ref 39–117)
BUN: 13 mg/dL (ref 6–23)
CO2: 29 mEq/L (ref 19–32)
Calcium: 9.6 mg/dL (ref 8.4–10.5)
Chloride: 103 mEq/L (ref 96–112)
Creatinine, Ser: 0.81 mg/dL (ref 0.40–1.20)
GFR: 70.3 mL/min (ref >60.00)
Glucose, Bld: 135 mg/dL — ABNORMAL HIGH (ref 70–99)
Potassium: 3.8 mEq/L (ref 3.5–5.1)
Sodium: 139 mEq/L (ref 135–145)
Total Bilirubin: 0.7 mg/dL (ref 0.2–1.2)
Total Protein: 6.9 g/dL (ref 6.0–8.3)

## 2022-01-21 LAB — CBC WITH DIFFERENTIAL/PLATELET
Basophils Absolute: 0.1 10*3/uL (ref 0.0–0.1)
Basophils Relative: 0.8 % (ref 0.0–3.0)
Eosinophils Absolute: 0.1 10*3/uL (ref 0.0–0.7)
Eosinophils Relative: 1.3 % (ref 0.0–5.0)
HCT: 45 % (ref 36.0–46.0)
Hemoglobin: 15.1 g/dL — ABNORMAL HIGH (ref 12.0–15.0)
Lymphocytes Relative: 19 % (ref 12.0–46.0)
Lymphs Abs: 1.4 10*3/uL (ref 0.7–4.0)
MCHC: 33.4 g/dL (ref 30.0–36.0)
MCV: 99.9 fl (ref 78.0–100.0)
Monocytes Absolute: 0.6 10*3/uL (ref 0.1–1.0)
Monocytes Relative: 9 % (ref 3.0–12.0)
Neutro Abs: 5 10*3/uL (ref 1.4–7.7)
Neutrophils Relative %: 69.9 % (ref 43.0–77.0)
Platelets: 276 10*3/uL (ref 150.0–400.0)
RBC: 4.51 Mil/uL (ref 3.87–5.11)
RDW: 14.7 % (ref 11.5–15.5)
WBC: 7.2 10*3/uL (ref 4.0–10.5)

## 2022-01-21 LAB — HEMOGLOBIN A1C: Hgb A1c MFr Bld: 5.5 % (ref 4.6–6.5)

## 2022-01-21 LAB — MICROALBUMIN / CREATININE URINE RATIO
Creatinine,U: 212.8 mg/dL
Microalb Creat Ratio: 16.8 mg/g (ref 0.0–30.0)
Microalb, Ur: 35.8 mg/dL — ABNORMAL HIGH (ref 0.0–1.9)

## 2022-01-21 MED ORDER — ESCITALOPRAM OXALATE 10 MG PO TABS: 10.0000 mg | ORAL_TABLET | Freq: Every day | ORAL | 1 refills | Status: DC

## 2022-01-21 MED ORDER — TRELEGY ELLIPTA 100-62.5-25 MCG/ACT IN AEPB: 1.0000 | INHALATION_SPRAY | Freq: Every day | RESPIRATORY_TRACT | 11 refills | Status: DC

## 2022-01-21 NOTE — Assessment & Plan Note (Signed)
Well-controlled hypertension on no medications at present time.

## 2022-01-21 NOTE — Assessment & Plan Note (Signed)
Hyperthyroidism needs to be ruled out. Blood work done today.

## 2022-01-21 NOTE — Assessment & Plan Note (Signed)
Cardiovascular and cancer risks associated with smoking discussed. °Smoking cessation advice given. °

## 2022-01-21 NOTE — Progress Notes (Signed)
Terry Armstrong 76 y.o.   Chief Complaint  Patient presents with   New Patient (Initial Visit)    Concern about weight loss, dont have an appetite, depression and anxiety due to family loss.     HISTORY OF PRESENT ILLNESS: This is a 76 y.o. female A1A wife of former patient of mine who recently passed away, here to establish care with me. Has been depressed and anxious over recent personal loss. Complaining of decreased appetite and weight loss. Chronic smoker about 8 to 10 cigarettes/day. History of asthma.  Possible emphysema. History of hypertension on no medications History of osteoporosis on weekly Fosamax History of dyslipidemia no medication  HPI   Prior to Admission medications   Medication Sig Start Date End Date Taking? Authorizing Provider  albuterol (VENTOLIN HFA) 108 (90 Base) MCG/ACT inhaler INHALE 2 PUFFS INTO THE LUNGS  EVERY 6 HOURS AS NEEDED FOR WHEEZING FOR SHORTNESS OF BREATH 07/26/21  Yes Ngetich, Dinah C, NP  alendronate (FOSAMAX) 70 MG tablet Take one tablet once weekly. Take with a full glass of water on an empty stomach. 06/19/20  Yes Ngetich, Dinah C, NP  aspirin EC 81 MG tablet Take 1 tablet (81 mg total) by mouth daily. 06/28/19  Yes Delia Chimes A, MD  buPROPion (WELLBUTRIN XL) 150 MG 24 hr tablet Take 1 tablet (150 mg total) by mouth daily. 06/19/20  Yes Ngetich, Dinah C, NP  fluticasone (FLONASE) 50 MCG/ACT nasal spray Place 1 spray into both nostrils daily. 01/16/22  Yes [place], Michele Rockers, FNP  metoprolol succinate (TOPROL-XL) 25 MG 24 hr tablet Take 1 tablet by mouth once daily 04/08/21  Yes Skeet Latch, MD  multivitamin-iron-minerals-folic acid (CENTRUM) chewable tablet Chew 1 tablet by mouth daily.   Yes [provider]  Omega 3 1000 MG CAPS Take by mouth daily.   Yes [provider]  atorvastatin (LIPITOR) 40 MG tablet Take 1 tablet (40 mg total) by mouth daily. Patient not taking: Reported on 01/21/2022 01/18/21   Ngetich, Dinah C,  NP  losartan (COZAAR) 25 MG tablet Take 1 tablet (25 mg total) by mouth daily. Patient not taking: Reported on 01/21/2022 06/19/20   Ngetich, Dinah C, NP  mirtazapine (REMERON) 7.5 MG tablet Take 1 tablet (7.5 mg total) by mouth at bedtime. 07/26/21 11/23/21  Ngetich, Dinah C, NP  tiotropium (SPIRIVA) 18 MCG inhalation capsule Place 1 capsule (18 mcg total) into inhaler and inhale as needed. Patient not taking: Reported on 01/21/2022 01/18/21   Ngetich, Nelda Bucks, NP    No Known Allergies  Patient Active Problem List   Diagnosis Date Noted   Aortic atherosclerosis ([place]) 06/19/2020   VT (ventricular tachycardia) ([place]) 09/11/2019   Exertional dyspnea 08/18/2019   Palpitations 08/18/2019   Hx of adenomatous colonic polyps 06/19/2017   Multinodular goiter 03/15/2017   Centrilobular emphysema ([place]) 11/09/2016   Periodic limb movement sleep disorder 01/11/2015   Abnormal TSH 01/11/2015   Upper airway resistance syndrome 01/11/2015   Mild obstructive sleep apnea 01/11/2015   Osteoporosis 01/10/2015   Non-restorative sleep 05/23/2013   Primary snoring    HTN (hypertension) 05/11/2013   DM (diabetes mellitus) ([place]) 06/04/2011   Tobacco user 06/04/2011   Hyperlipidemia LDL goal <70 06/04/2011    Past Medical History:  Diagnosis Date   Allergy    Breast abscess    Constipation    if drinks alot of water no issues - uses womens stool softener PRN only    Diabetes mellitus without complication ([place])  pt has no meds- diet controlled only    Exertional dyspnea 08/18/2019   H/O mammogram 05/06/2019   Per [facility] new patient packet   Hx of adenomatous colonic polyps 06/19/2017   Hypercholesteremia    Per [facility] new patient packet   Hyperlipidemia    Hypertension    Per [place] new patient packet   Palpitations 08/18/2019   Primary snoring    Thyroid condition    Per [facility] new patient packet   VT (ventricular tachycardia) ([place]) 09/11/2019    Past Surgical History:  Procedure Laterality Date    ABDOMINAL HYSTERECTOMY  1993   COLONOSCOPY     POLYPECTOMY      Social History   Socioeconomic History   Marital status: Married    Spouse name: Not on file   Number of children: 0   Years of education: 14   Highest education level: Not on file  Occupational History    Employer: RETIRED  Tobacco Use   Smoking status: Every Day    Packs/day: 1.50    Years: 49.00    Total pack years: 73.50    Types: Cigarettes   Smokeless tobacco: Never   Tobacco comments:    1/2 ppd 01/16/20  Vaping Use   Vaping Use: Never used  Substance and Sexual Activity   Alcohol use: Yes    Comment: 2-4 per week   Drug use: No   Sexual activity: Not Currently  Other Topics Concern   Not on file  Social History Narrative   Diet       Do you drink/eat things with caffeine Coffee      Marital Status Married What year were you married? 2015      Do you live in a house, apartment, assisted living, condo, trailer, etc.? [place]      Is it one or more stories? Yes 3 stories      How many persons live in your home? 2        Do you have any pets in your home?(please list) No      Highest level of education completed: 2 years college      Current or past profession: [employer]      Do you exercise?: Yes    Type and how often: Daily      Do you have a Living Will? (Form that indicates scenarios where you would not want your life prolonged) No      Do you have a DNR form?         If not, would you like to discuss one? No      Do you have signed POA/HPOA forms? No      Do you have difficulty bathing or dressing yourself? No      Do you have difficulty preparing food or eating? No      Do you have difficulty managing medications? No      Do you have difficulty managing your finances? No      Do you have difficulty affording your medications? No                     Social Determinants of Radio broadcast assistant Strain: Not on file  Food Insecurity: Not on file   Transportation Needs: Not on file  Physical Activity: Not on file  Stress: Not on file  Social Connections: Not on file  Intimate Partner Violence: Not on file    Family History  Problem  Relation Age of Onset   Diabetes Mother    Diabetes Father    Diabetes Other    Hypertension Sister    Hyperlipidemia Other    Heart disease Sister        defibrillator   Kidney failure Maternal Aunt        dialysis   Diabetes Maternal Aunt    Hypertension Maternal Aunt    Diabetes Maternal Aunt    Heart Problems Maternal Grandmother    Cancer Maternal Grandmother    Heart disease Maternal Grandmother    Hypertension Maternal Grandmother    Diabetes Brother    Colon cancer Neg Hx    Colon polyps Neg Hx    Rectal cancer Neg Hx    Stomach cancer Neg Hx      Review of Systems  Constitutional:  Positive for weight loss. Negative for chills and fever.  HENT: Negative.  Negative for congestion and sore throat.   Respiratory: Negative.  Negative for cough and shortness of breath.   Cardiovascular: Negative.  Negative for chest pain and palpitations.  Gastrointestinal: Negative.  Negative for abdominal pain, diarrhea, nausea and vomiting.  Genitourinary: Negative.   Skin: Negative.  Negative for rash.  Neurological: Negative.  Negative for dizziness and headaches.  Psychiatric/Behavioral:  Positive for depression. The patient is nervous/anxious.   All other systems reviewed and are negative.   Today's Vitals   01/21/22 1302  BP: 132/78  Pulse: (!) 115  Temp: 98 F (36.7 C)  TempSrc: Oral  SpO2: 92%  Weight: 97 lb 8 oz (44.2 kg)  Height: '5\' 4"'$  (1.626 m)   Body mass index is 16.74 kg/m.  Physical Exam Vitals reviewed.  Constitutional:      Appearance: Normal appearance.  HENT:     Head: Normocephalic.     Mouth/Throat:     Mouth: Mucous membranes are moist.     Pharynx: Oropharynx is clear.  Eyes:     Extraocular Movements: Extraocular movements intact.      Conjunctiva/sclera: Conjunctivae normal.     Pupils: Pupils are equal, round, and reactive to light.  Cardiovascular:     Rate and Rhythm: Normal rate and regular rhythm.     Pulses: Normal pulses.     Heart sounds: Normal heart sounds.  Pulmonary:     Effort: Pulmonary effort is normal.     Breath sounds: Normal breath sounds.  Musculoskeletal:     Cervical back: No tenderness.  Lymphadenopathy:     Cervical: No cervical adenopathy.  Skin:    General: Skin is warm and dry.     Capillary Refill: Capillary refill takes less than 2 seconds.  Neurological:     General: No focal deficit present.     Mental Status: She is alert and oriented to person, place, and time.  Psychiatric:        Mood and Affect: Mood normal.        Behavior: Behavior normal.    DG Chest 2 View  Result Date: 01/21/2022 CLINICAL DATA:  Shortness of breath. EXAM: CHEST - 2 VIEW COMPARISON:  CT scan 05/16/2020 FINDINGS: The cardiac silhouette, mediastinal and hilar contours are within normal limits. Advanced emphysematous changes with marked hyperinflation. No acute overlying pulmonary process is identified. No pulmonary lesions or pleural effusions. The bony thorax is intact. IMPRESSION: Advanced emphysematous changes but no acute overlying pulmonary process. Electronically Signed   By: Marijo Sanes M.D.   On: 01/21/2022 13:49     ASSESSMENT &  PLAN: A total of 62 minutes was spent with the patient and counseling/coordination of care regarding preparing for this visit, review of available medical records, review of multiple chronic medical problems and their management, review of all medications, comprehensive history and physical examination, diagnosis of acute situational depression/anxiety and need for treatment with medication, education on nutrition, differential diagnosis of weight loss and need for work-up, smoking cessation advised, prognosis, documentation, and need for follow-up.  Problem List Items  Addressed This Visit       Cardiovascular and Mediastinum   HTN (hypertension)    Well-controlled hypertension on no medications at present time.        Respiratory   Centrilobular emphysema (HCC)    Stable.  Smoking cessation advice given. Presently not on any inhaler. Recommend daily Trelegy.      Relevant Medications   Fluticasone-Umeclidin-Vilant (TRELEGY ELLIPTA) 100-62.5-25 MCG/ACT AEPB     Musculoskeletal and Integument   Osteoporosis    Continue weekly Fosamax 70 mg.        Other   Tobacco user    Cardiovascular and cancer risks associated with smoking discussed. Smoking cessation advice given.      Abnormal TSH    Hyperthyroidism needs to be ruled out. Blood work done today.      Situational mixed anxiety and depressive disorder    Acute and affecting quality of life. Related to grieving process. Contributing factor to weight loss. Will start Lexapro 10 mg daily and follow-up in 4 weeks. Depression/anxiety management advice given.      Relevant Medications   escitalopram (LEXAPRO) 10 MG tablet   Weight loss - Primary    Differential diagnosis discussed. Needs work-up. Blood work, urinalysis, chest x-ray done today. Up-to-date with colonoscopy and mammogram.      Relevant Orders   Thyroid Panel With TSH   CBC with Differential/Platelet   Comprehensive metabolic panel   Hemoglobin A1c   Lipid panel   DG Chest 2 View (Completed)   Urinalysis   Urine Microalbumin w/creat. ratio   Other Visit Diagnoses     Need for vaccination       Relevant Orders   Flu Vaccine QUAD High Dose(Fluad) (Completed)   Encounter to establish care       Essential hypertension       Current smoker       Grieving          Patient Instructions  Major Depressive Disorder, Adult Major depressive disorder is a mental health condition. This disorder affects feelings. It can also affect the body. Symptoms of this condition last most of the day, almost every day, for  2 weeks. This disorder can affect: Relationships. Daily activities, such as work and school. Activities that you normally like to do. What are the causes? The cause of this condition is not known. The disorder is likely caused by a mix of things, including: Your personality, such as being a shy person. Your behavior, or how you act toward others. Your thoughts and feelings. Too much alcohol or drugs. How you react to stress. Health and mental problems that you have had for a long time. Things that hurt you in the past (trauma). Big changes in your life, such as divorce. What increases the risk? The following factors may make you more likely to develop this condition: Having family members with depression. Being a woman. Problems in the family. Low levels of some brain chemicals. Things that caused you pain as a child, especially if you  lost a parent or were abused. A lot of stress in your life, such as from: Living without basic needs of life, such as food and shelter. Being treated poorly because of race, sex, or religion (discrimination). Health and mental problems that you have had for a long time. What are the signs or symptoms? The main symptoms of this condition are: Being sad all the time. Being grouchy all the time. Loss of interest in things and activities. Other symptoms include: Sleeping too much or too little. Eating too much or too little. Gaining or losing weight, without knowing why. Feeling tired or having low energy. Being restless and weak. Feeling hopeless, worthless, or guilty. Trouble thinking clearly or making decisions. Thoughts of hurting yourself or others, or thoughts of ending your life. Spending a lot of time alone. Inability to complete common tasks of daily life. If you have very bad MDD, you may: Believe things that are not true. Hear, see, taste, or feel things that are not there. Have mild depression that lasts for at least 2 years. Feel  very sad and hopeless. Have trouble speaking or moving. How is this treated? This condition may be treated with: Talk therapy. This teaches you to know bad thoughts, feelings, and actions and how to change them. This can also help you to communicate with others. This can be done with members of your family. Medicines. These can be used to treat worry (anxiety), depression, or low levels of chemicals in the brain. Lifestyle changes. You may need to: Limit alcohol use. Limit drug use. Get regular exercise. Get plenty of sleep. Make healthy eating choices. Spend more time outdoors. Brain stimulation. This treatment excites the brain. This is done when symptoms are very bad or have not gotten better with other treatments. Follow these instructions at home: Activity Get regular exercise as told. Spend time outdoors as told. Make time to do the things you enjoy. Find ways to deal with stress. Try to: Meditate. Do deep breathing. Spend time in nature. Keep a journal. Return to your normal activities as told by your doctor. Ask your doctor what activities are safe for you. Alcohol and drug use If you drink alcohol: Limit how much you use to: 0-1 drink a day for women. 0-2 drinks a day for men. Be aware of how much alcohol is in your drink. In the [place] one drink equals one 12 oz bottle of beer (355 mL), one 5 oz glass of wine (148 mL), or one 1 oz glass of hard liquor (44 mL). Talk to your doctor about: Alcohol use. Alcohol can affect some medicines. Any drug use. General instructions  Take over-the-counter and prescription medicines and herbal preparations only as told by your doctor. Eat a healthy diet. Get a lot of sleep. Think about joining a support group. Your doctor may be able to suggest one. Keep all follow-up visits as told by your doctor. This is important. Where to find more information: [employer] on Mental Illness: www.nami.[place]: https://carter.com/ American Psychiatric Association: www.psychiatry.org/patients-families/ Contact a doctor if: Your symptoms get worse. You get new symptoms. Get help right away if: You hurt yourself. You have serious thoughts about hurting yourself or others. You see, hear, taste, smell, or feel things that are not there. If you ever feel like you may hurt yourself or others, or have thoughts about taking your own life, get help right away. Go to your nearest emergency department or: Call your local  emergency services (911 in the [place] Call a suicide crisis helpline, such as the [place] at 405-420-9432 or 988 in the [place]. This is open 24 hours a day in the [place] Text the Crisis Text Line at (619)301-1427 (in the [place].). Summary Major depressive disorder is a mental health condition. This disorder affects feelings. Symptoms of this condition last most of the day, almost every day, for 2 weeks. The symptoms of this disorder can cause problems with relationships and with daily activities. There are treatments and support for people who get this disorder. You may need more than one type of treatment. Get help right away if you have serious thoughts about hurting yourself or others. This information is not intended to replace advice given to you by your health care provider. Make sure you discuss any questions you have with your health care provider. Document Revised: 11/14/2020 Document Reviewed: 04/02/2019 Elsevier Patient Education  2023 [place], MD [facility]

## 2022-01-21 NOTE — Assessment & Plan Note (Signed)
Differential diagnosis discussed. Needs work-up. Blood work, urinalysis, chest x-ray done today. Up-to-date with colonoscopy and mammogram.

## 2022-01-21 NOTE — Assessment & Plan Note (Signed)
Continue weekly Fosamax 70 mg.

## 2022-01-21 NOTE — Assessment & Plan Note (Signed)
Stable.  Smoking cessation advice given. Presently not on any inhaler. Recommend daily Trelegy.

## 2022-01-21 NOTE — Assessment & Plan Note (Addendum)
Acute and affecting quality of life. Related to grieving process. Contributing factor to weight loss. Will start Lexapro 10 mg daily and follow-up in 4 weeks. Depression/anxiety management advice given.

## 2022-01-21 NOTE — Patient Instructions (Signed)
Major Depressive Disorder, Adult Major depressive disorder is a mental health condition. This disorder affects feelings. It can also affect the body. Symptoms of this condition last most of the day, almost every day, for 2 weeks. This disorder can affect: Relationships. Daily activities, such as work and school. Activities that you normally like to do. What are the causes? The cause of this condition is not known. The disorder is likely caused by a mix of things, including: Your personality, such as being a shy person. Your behavior, or how you act toward others. Your thoughts and feelings. Too much alcohol or drugs. How you react to stress. Health and mental problems that you have had for a long time. Things that hurt you in the past (trauma). Big changes in your life, such as divorce. What increases the risk? The following factors may make you more likely to develop this condition: Having family members with depression. Being a woman. Problems in the family. Low levels of some brain chemicals. Things that caused you pain as a child, especially if you lost a parent or were abused. A lot of stress in your life, such as from: Living without basic needs of life, such as food and shelter. Being treated poorly because of race, sex, or religion (discrimination). Health and mental problems that you have had for a long time. What are the signs or symptoms? The main symptoms of this condition are: Being sad all the time. Being grouchy all the time. Loss of interest in things and activities. Other symptoms include: Sleeping too much or too little. Eating too much or too little. Gaining or losing weight, without knowing why. Feeling tired or having low energy. Being restless and weak. Feeling hopeless, worthless, or guilty. Trouble thinking clearly or making decisions. Thoughts of hurting yourself or others, or thoughts of ending your life. Spending a lot of time alone. Inability to  complete common tasks of daily life. If you have very bad MDD, you may: Believe things that are not true. Hear, see, taste, or feel things that are not there. Have mild depression that lasts for at least 2 years. Feel very sad and hopeless. Have trouble speaking or moving. How is this treated? This condition may be treated with: Talk therapy. This teaches you to know bad thoughts, feelings, and actions and how to change them. This can also help you to communicate with others. This can be done with members of your family. Medicines. These can be used to treat worry (anxiety), depression, or low levels of chemicals in the brain. Lifestyle changes. You may need to: Limit alcohol use. Limit drug use. Get regular exercise. Get plenty of sleep. Make healthy eating choices. Spend more time outdoors. Brain stimulation. This treatment excites the brain. This is done when symptoms are very bad or have not gotten better with other treatments. Follow these instructions at home: Activity Get regular exercise as told. Spend time outdoors as told. Make time to do the things you enjoy. Find ways to deal with stress. Try to: Meditate. Do deep breathing. Spend time in nature. Keep a journal. Return to your normal activities as told by your doctor. Ask your doctor what activities are safe for you. Alcohol and drug use If you drink alcohol: Limit how much you use to: 0-1 drink a day for women. 0-2 drinks a day for men. Be aware of how much alcohol is in your drink. In the [place] one drink equals one 12 oz bottle of beer (355 mL),   one 5 oz glass of wine (148 mL), or one 1 oz glass of hard liquor (44 mL). Talk to your doctor about: Alcohol use. Alcohol can affect some medicines. Any drug use. General instructions  Take over-the-counter and prescription medicines and herbal preparations only as told by your doctor. Eat a healthy diet. Get a lot of sleep. Think about joining a support group.  Your doctor may be able to suggest one. Keep all follow-up visits as told by your doctor. This is important. Where to find more information: [employer] on Mental Illness: www.nami.[place]: https://carter.com/ American Psychiatric Association: www.psychiatry.org/patients-families/ Contact a doctor if: Your symptoms get worse. You get new symptoms. Get help right away if: You hurt yourself. You have serious thoughts about hurting yourself or others. You see, hear, taste, smell, or feel things that are not there. If you ever feel like you may hurt yourself or others, or have thoughts about taking your own life, get help right away. Go to your nearest emergency department or: Call your local emergency services (911 in the [place] Call a suicide crisis helpline, such as the [place] at (847) 514-4132 or 988 in the [place]. This is open 24 hours a day in the [place] Text the Crisis Text Line at 864-857-9733 (in the [place].). Summary Major depressive disorder is a mental health condition. This disorder affects feelings. Symptoms of this condition last most of the day, almost every day, for 2 weeks. The symptoms of this disorder can cause problems with relationships and with daily activities. There are treatments and support for people who get this disorder. You may need more than one type of treatment. Get help right away if you have serious thoughts about hurting yourself or others. This information is not intended to replace advice given to you by your health care provider. Make sure you discuss any questions you have with your health care provider. Document Revised: 11/14/2020 Document Reviewed: 04/02/2019 Elsevier Patient Education  [place].

## 2022-01-22 LAB — THYROID PANEL WITH TSH
Free Thyroxine Index: 2.3 (ref 1.4–3.8)
T3 Uptake: 36 % — ABNORMAL HIGH (ref 22–35)
T4, Total: 6.4 ug/dL (ref 5.1–11.9)
TSH: 0.17 mIU/L — ABNORMAL LOW (ref 0.40–4.50)

## 2022-01-27 ENCOUNTER — Ambulatory Visit: Payer: PPO | Admitting: Family

## 2022-02-17 ENCOUNTER — Ambulatory Visit (INDEPENDENT_AMBULATORY_CARE_PROVIDER_SITE_OTHER): Payer: PPO

## 2022-02-17 VITALS — Ht 66.0 in | Wt 100.0 lb

## 2022-02-17 DIAGNOSIS — Z0000 Encounter for general adult medical examination without abnormal findings: Secondary | ICD-10-CM

## 2022-02-17 NOTE — Progress Notes (Signed)
Subjective:   Emina Ribaudo is a 76 y.o. female who presents for Medicare Annual (Subsequent) preventive examination.   I connected with  Jonn Shingles on 02/17/22 by a audio enabled telemedicine application and verified that I am speaking with the correct person using two identifiers.  Patient Location: Home  Provider Location: Home Office  I discussed the limitations of evaluation and management by telemedicine. The patient expressed understanding and agreed to proceed.  Review of Systems     Cardiac Risk Factors include: advanced age (>9mn, >>23women);smoking/ tobacco exposure;hypertension     Objective:    Today's Vitals   02/17/22 1046  Weight: 100 lb (45.4 kg)  Height: '5\' 6"'$  (1.676 m)   Body mass index is 16.14 kg/m.     02/17/2022   10:52 AM 07/26/2021   11:28 AM 01/15/2021    4:35 PM 06/19/2020    1:24 PM 09/11/2019    6:50 AM 07/07/2019    1:35 PM 05/28/2017   10:19 AM  Advanced Directives  Does Patient Have a Medical Advance Directive? No No No No Yes No No  Type of Advance Directive     Healthcare Power of Attorney    Would patient like information on creating a medical advance directive? No - Patient declined No - Patient declined No - Patient declined No - Patient declined  Yes (ED - Information included in AVS)     Current Medications (verified) Outpatient Encounter Medications as of 02/17/2022  Medication Sig   albuterol (VENTOLIN HFA) 108 (90 Base) MCG/ACT inhaler INHALE 2 PUFFS INTO THE LUNGS  EVERY 6 HOURS AS NEEDED FOR WHEEZING FOR SHORTNESS OF BREATH   alendronate (FOSAMAX) 70 MG tablet Take one tablet once weekly. Take with a full glass of water on an empty stomach.   aspirin EC 81 MG tablet Take 1 tablet (81 mg total) by mouth daily.   escitalopram (LEXAPRO) 10 MG tablet Take 1 tablet (10 mg total) by mouth daily.   fluticasone (FLONASE) 50 MCG/ACT nasal spray Place 1 spray into both nostrils daily.   Fluticasone-Umeclidin-Vilant (TRELEGY ELLIPTA)  100-62.5-25 MCG/ACT AEPB Inhale 1 puff into the lungs daily.   multivitamin-iron-minerals-folic acid (CENTRUM) chewable tablet Chew 1 tablet by mouth daily.   Omega 3 1000 MG CAPS Take by mouth daily.   atorvastatin (LIPITOR) 40 MG tablet Take 1 tablet (40 mg total) by mouth daily. (Patient not taking: Reported on 02/17/2022)   losartan (COZAAR) 25 MG tablet Take 1 tablet (25 mg total) by mouth daily. (Patient not taking: Reported on 02/17/2022)   metoprolol succinate (TOPROL-XL) 25 MG 24 hr tablet Take 1 tablet by mouth once daily (Patient not taking: Reported on 02/17/2022)   tiotropium (SPIRIVA) 18 MCG inhalation capsule Place 1 capsule (18 mcg total) into inhaler and inhale as needed. (Patient not taking: Reported on 02/17/2022)   No facility-administered encounter medications on file as of 02/17/2022.    Allergies (verified) Patient has no known allergies.   History: Past Medical History:  Diagnosis Date   Allergy    Breast abscess    Constipation    if drinks alot of water no issues - uses womens stool softener PRN only    Diabetes mellitus without complication ([place]    pt has no meds- diet controlled only    Exertional dyspnea 08/18/2019   H/O mammogram 05/06/2019   [facility]   Hx of adenomatous colonic polyps 06/19/2017   Hypercholesteremia    [facility] [place]   Hyperlipidemia    Hypertension    Per [facility] new patient packet   Palpitations 08/18/2019   Primary snoring    Thyroid condition    Per [facility] new patient packet   VT (ventricular tachycardia) ([place]) 09/11/2019   Past Surgical History:  Procedure Laterality Date   ABDOMINAL HYSTERECTOMY  1993   COLONOSCOPY     POLYPECTOMY     Family History  Problem Relation Age of Onset   Diabetes Mother    Diabetes Father    Diabetes Other    Hypertension Sister    Hyperlipidemia Other    Heart disease Sister        defibrillator   Kidney failure Maternal Aunt        dialysis   Diabetes Maternal  Aunt    Hypertension Maternal Aunt    Diabetes Maternal Aunt    Heart Problems Maternal Grandmother    Cancer Maternal Grandmother    Heart disease Maternal Grandmother    Hypertension Maternal Grandmother    Diabetes Brother    Colon cancer Neg Hx    Colon polyps Neg Hx    Rectal cancer Neg Hx    Stomach cancer Neg Hx    Social History   Socioeconomic History   Marital status: Married    Spouse name: Not on file   Number of children: 0   Years of education: 14   Highest education level: Not on file  Occupational History    Employer: RETIRED  Tobacco Use   Smoking status: Every Day    Packs/day: 1.50    Years: 49.00    Total pack years: 73.50    Types: Cigarettes   Smokeless tobacco: Never   Tobacco comments:    1/2 ppd 01/16/20  Vaping Use   Vaping Use: Never used  Substance and Sexual Activity   Alcohol use: Yes    Comment: 2-4 per week   Drug use: No   Sexual activity: Not Currently  Other Topics Concern   Not on file  Social History Narrative   Diet       Do you drink/eat things with caffeine Coffee      Marital Status Married What year were you married? 2015      Do you live in a house, apartment, assisted living, condo, trailer, etc.? [place]      Is it one or more stories? Yes 3 stories      How many persons live in your home? 2        Do you have any pets in your home?(please list) No      Highest level of education completed: 2 years college      Current or past profession: [employer]      Do you exercise?: Yes    Type and how often: Daily      Do you have a Living Will? (Form that indicates scenarios where you would not want your life prolonged) No      Do you have a DNR form?         If not, would you like to discuss one? No      Do you have signed POA/HPOA forms? No      Do you have difficulty bathing or dressing yourself? No      Do you have difficulty preparing food or eating? No      Do you have difficulty managing  medications? No      Do you  have difficulty managing your finances? No      Do you have difficulty affording your medications? No                     Social Determinants of Health   Financial Resource Strain: Low Risk  (02/17/2022)   Overall Financial Resource Strain (CARDIA)    Difficulty of Paying Living Expenses: Not hard at all  Food Insecurity: No Food Insecurity (02/17/2022)   Hunger Vital Sign    Worried About Running Out of Food in the Last Year: Never true    Ran Out of Food in the Last Year: Never true  Transportation Needs: No Transportation Needs (02/17/2022)   PRAPARE - Hydrologist (Medical): No    Lack of Transportation (Non-Medical): No  Physical Activity: Insufficiently Active (02/17/2022)   Exercise Vital Sign    Days of Exercise per Week: 3 days    Minutes of Exercise per Session: 30 min  Stress: No Stress Concern Present (02/17/2022)   [place]    Feeling of Stress : Not at all  Social Connections: Moderately Isolated (02/17/2022)   Social Connection and Isolation Panel [NHANES]    Frequency of Communication with Friends and Family: More than three times a week    Frequency of Social Gatherings with Friends and Family: More than three times a week    Attends Religious Services: More than 4 times per year    Active Member of [employer] or Organizations: No    Attends Archivist Meetings: Never    Marital Status: Widowed    Tobacco Counseling Ready to quit: Not Answered Counseling given: Not Answered Tobacco comments: 1/2 ppd 01/16/20   Clinical Intake:  Pre-visit preparation completed: Yes  Pain : No/denies pain     Nutritional Risks: None Diabetes: No  How often do you need to have someone help you when you read instructions, pamphlets, or other written materials from your doctor or pharmacy?: 1 - Never  Diabetic?no   Interpreter  Needed?: No  Information entered by :: Amy Hopkins, LPN   Activities of Daily Living    02/17/2022   10:52 AM  In your present state of health, do you have any difficulty performing the following activities:  Hearing? 0  Vision? 0  Difficulty concentrating or making decisions? 0  Walking or climbing stairs? 0  Dressing or bathing? 0  Doing errands, shopping? 0  Preparing Food and eating ? N  Using the Toilet? N  In the past six months, have you accidently leaked urine? N  Do you have problems with loss of bowel control? N  Managing your Medications? N  Managing your Finances? N  Housekeeping or managing your Housekeeping? N    Patient Care Team: Horald Pollen, MD as PCP - General (Internal Medicine) Shawnee Knapp, MD as PCP - Family Medicine (Family Medicine) Skeet Latch, MD as PCP - Cardiology (Cardiology) Clent Jacks, MD as Consulting Physician (Ophthalmology)  Indicate any recent Medical Services you may have received from other than [facility] providers in the past year (date may be approximate).     Assessment:   This is a routine wellness examination for Nathalya.  Hearing/Vision screen Vision Screening - Comments:: Annual eye exams wear glasses   Dietary issues and exercise activities discussed: Current Exercise Habits: Home exercise routine, Type of exercise: walking, Time (Minutes): 30, Frequency (Times/Week): 3, Weekly Exercise (Minutes/Week): 90, Intensity:  Mild, Exercise limited by: None identified   Goals Addressed             This Visit's Progress    Quit smoking / using tobacco   On track    Patient states that she wants to try to quit smoking in the near future.        Depression Screen    02/17/2022   10:51 AM 01/21/2022    1:05 PM 01/15/2021    4:33 PM 01/20/2020    8:00 AM 07/07/2019    1:38 PM 06/28/2019    8:55 AM 06/28/2019    8:43 AM  PHQ 2/9 Scores  PHQ - 2 Score 2 5 0 0 0 0 0  PHQ- 9 Score  12         Fall Risk     02/17/2022   10:47 AM 01/21/2022    1:05 PM 07/26/2021   11:28 AM 01/15/2021    4:33 PM 07/04/2020    1:09 PM  Fall Risk   Falls in the past year? 0 0 0 0 0  Number falls in past yr: 0 0 0 0 0  Injury with Fall? 0 0 0 0 0  Risk for fall due to : No Fall Risks No Fall Risks No Fall Risks No Fall Risks   Follow up Falls prevention discussed Falls evaluation completed Falls evaluation completed Falls evaluation completed     [place]:  Any stairs in or around the home? Yes  If so, are there any without handrails? No  Home free of loose throw rugs in walkways, pet beds, electrical cords, etc? Yes  Adequate lighting in your home to reduce risk of falls? Yes   ASSISTIVE DEVICES UTILIZED TO PREVENT FALLS:  Life alert? No  Use of a cane, walker or w/c? No  Grab bars in the bathroom? Yes  Shower chair or bench in shower? Yes  Elevated toilet seat or a handicapped toilet? Yes          02/17/2022   10:53 AM 01/15/2021    4:34 PM 07/07/2019    1:36 PM 03/03/2017    9:39 AM  6CIT Screen  What Year? 0 points 0 points 0 points 0 points  What month? 0 points 0 points 0 points 0 points  What time? 0 points 0 points 0 points 0 points  Count back from 20 0 points 0 points 0 points 0 points  Months in reverse 0 points 0 points 0 points 0 points  Repeat phrase 0 points 0 points 0 points 2 points  Total Score 0 points 0 points 0 points 2 points    Immunizations Immunization History  Administered Date(s) Administered   Fluad Quad(high Dose 65+) 02/15/2019, 01/21/2022   Influenza Split 01/28/2012   Influenza, High Dose Seasonal PF 06/20/2016, 02/10/2018   Influenza,inj,Quad PF,6+ Mos 04/08/2013, 04/15/2014, 01/11/2015, 03/03/2017   Moderna Sars-Covid-2 Vaccination 06/16/2019, 07/19/2019, 04/12/2020   Pneumococcal Conjugate-13 07/26/2015   Pneumococcal Polysaccharide-23 11/14/2010   Td 06/28/2019   Tdap 10/11/2008   Zoster, Live 05/05/2014    TDAP  status: Up to date  Flu Vaccine status: Up to date  Pneumococcal vaccine status: Up to date  Covid-19 vaccine status: Completed vaccines  Qualifies for Shingles Vaccine? Yes   Zostavax completed No   Shingrix Completed?: No.    Education has been provided regarding the importance of this vaccine. Patient has been advised to call insurance company to determine out of pocket  expense if they have not yet received this vaccine. Advised may also receive vaccine at local pharmacy or Health Dept. Verbalized acceptance and understanding.  Screening Tests Health Maintenance  Topic Date Due   COVID-19 Vaccine (4 - Moderna series) 06/07/2020   FOOT EXAM  01/18/2022   OPHTHALMOLOGY EXAM  07/22/2022 (Originally 07/15/2018)   Zoster Vaccines- Shingrix (1 of 2) 07/22/2022 (Originally 03/15/1995)   COLONOSCOPY (Pts 45-52yr Insurance coverage will need to be confirmed)  06/11/2022   HEMOGLOBIN A1C  07/22/2022   Diabetic kidney evaluation - GFR measurement  01/22/2023   Diabetic kidney evaluation - Urine ACR  01/22/2023   TETANUS/TDAP  06/27/2029   Pneumonia Vaccine 76 Years old  Completed   INFLUENZA VACCINE  Completed   DEXA SCAN  Completed   Hepatitis C Screening  Completed   HPV VACCINES  Aged Out    Health Maintenance  Health Maintenance Due  Topic Date Due   COVID-19 Vaccine (4 - Moderna series) 06/07/2020   FOOT EXAM  01/18/2022    Colorectal cancer screening: No longer required.   Mammogram status: No longer required due to age.  Bone Density status: Completed 0108/2015. Results reflect: Bone density results: OSTEOPENIA. Repeat every 5 years.  Lung Cancer Screening: (Low Dose CT Chest recommended if Age 275-80years, 30 pack-year currently smoking OR have quit w/in 15years.) does not qualify.   Lung Cancer Screening Referral: n/a  Additional Screening:  Hepatitis C Screening: does not qualify;   Vision Screening: Recommended annual ophthalmology exams for early detection of  glaucoma and other disorders of the eye. Is the patient up to date with their annual eye exam?  Yes  Who is the provider or what is the name of the office in which the patient attends annual eye exams? Dr.Groat  If pt is not established with a provider, would they like to be referred to a provider to establish care? No .   Dental Screening: Recommended annual dental exams for proper oral hygiene  Community Resource Referral / Chronic Care Management: CRR required this visit?  No   CCM required this visit?  No      Plan:     I have personally reviewed and noted the following in the patient's chart:   Medical and social history Use of alcohol, tobacco or illicit drugs  Current medications and supplements including opioid prescriptions. Patient is not currently taking opioid prescriptions. Functional ability and status Nutritional status Physical activity Advanced directives List of other physicians Hospitalizations, surgeries, and ER visits in previous 12 months Vitals Screenings to include cognitive, depression, and falls Referrals and appointments  In addition, I have reviewed and discussed with patient certain preventive protocols, quality metrics, and best practice recommendations. A written personalized care plan for preventive services as well as general preventive health recommendations were provided to patient.     LDaphane Shepherd LPN   [id number]  Nurse Notes: none

## 2022-02-17 NOTE — Patient Instructions (Signed)
Terry Armstrong , Thank you for taking time to come for your Medicare Wellness Visit. I appreciate your ongoing commitment to your health goals. Please review the following plan we discussed and let me know if I can assist you in the future.   These are the goals we discussed:  Goals      Quit smoking / using tobacco     Patient states that she wants to try to quit smoking in the near future.         This is a list of the screening recommended for you and due dates:  Health Maintenance  Topic Date Due   COVID-19 Vaccine (4 - Moderna series) 06/07/2020   Complete foot exam   01/18/2022   Eye exam for diabetics  07/22/2022*   Zoster (Shingles) Vaccine (1 of 2) 07/22/2022*   Colon Cancer Screening  06/11/2022   Hemoglobin A1C  07/22/2022   Yearly kidney function blood test for diabetes  01/22/2023   Yearly kidney health urinalysis for diabetes  01/22/2023   Tetanus Vaccine  06/27/2029   Pneumonia Vaccine  Completed   Flu Shot  Completed   DEXA scan (bone density measurement)  Completed   Hepatitis C Screening: USPSTF Recommendation to screen - Ages 61-79 yo.  Completed   HPV Vaccine  Aged Out  *Topic was postponed. The date shown is not the original due date.    Advanced directives: Advance directive discussed with you today. I have provided a copy for you to complete at home and have notarized. Once this is complete please bring a copy in to our office so we can scan it into your chart.   Conditions/risks identified: Aim for 30 minutes of exercise or brisk walking, 6-8 glasses of water, and 5 servings of fruits and vegetables each day.   Next appointment: Follow up in one year for your annual wellness visit    Preventive Care 65 Years and Older, Female Preventive care refers to lifestyle choices and visits with your health care provider that can promote health and wellness. What does preventive care include? A yearly physical exam. This is also called an annual well check. Dental  exams once or twice a year. Routine eye exams. Ask your health care provider how often you should have your eyes checked. Personal lifestyle choices, including: Daily care of your teeth and gums. Regular physical activity. Eating a healthy diet. Avoiding tobacco and drug use. Limiting alcohol use. Practicing safe sex. Taking low-dose aspirin every day. Taking vitamin and mineral supplements as recommended by your health care provider. What happens during an annual well check? The services and screenings done by your health care provider during your annual well check will depend on your age, overall health, lifestyle risk factors, and family history of disease. Counseling  Your health care provider may ask you questions about your: Alcohol use. Tobacco use. Drug use. Emotional well-being. Home and relationship well-being. Sexual activity. Eating habits. History of falls. Memory and ability to understand (cognition). Work and work Statistician. Reproductive health. Screening  You may have the following tests or measurements: Height, weight, and BMI. Blood pressure. Lipid and cholesterol levels. These may be checked every 5 years, or more frequently if you are over 33 years old. Skin check. Lung cancer screening. You may have this screening every year starting at age 19 if you have a 30-pack-year history of smoking and currently smoke or have quit within the past 15 years. Fecal occult blood test (FOBT) of the stool.  You may have this test every year starting at age 53. Flexible sigmoidoscopy or colonoscopy. You may have a sigmoidoscopy every 5 years or a colonoscopy every 10 years starting at age 31. Hepatitis C blood test. Hepatitis B blood test. Sexually transmitted disease (STD) testing. Diabetes screening. This is done by checking your blood sugar (glucose) after you have not eaten for a while (fasting). You may have this done every 1-3 years. Bone density scan. This is done  to screen for osteoporosis. You may have this done starting at age 48. Mammogram. This may be done every 1-2 years. Talk to your health care provider about how often you should have regular mammograms. Talk with your health care provider about your test results, treatment options, and if necessary, the need for more tests. Vaccines  Your health care provider may recommend certain vaccines, such as: Influenza vaccine. This is recommended every year. Tetanus, diphtheria, and acellular pertussis (Tdap, Td) vaccine. You may need a Td booster every 10 years. Zoster vaccine. You may need this after age 39. Pneumococcal 13-valent conjugate (PCV13) vaccine. One dose is recommended after age 48. Pneumococcal polysaccharide (PPSV23) vaccine. One dose is recommended after age 39. Talk to your health care provider about which screenings and vaccines you need and how often you need them. This information is not intended to replace advice given to you by your health care provider. Make sure you discuss any questions you have with your health care provider. Document Released: 05/18/2015 Document Revised: 01/09/2016 Document Reviewed: 02/20/2015 Elsevier Interactive Patient Education  2017 [place] Prevention in the Home Falls can cause injuries. They can happen to people of all ages. There are many things you can do to make your home safe and to help prevent falls. What can I do on the outside of my home? Regularly fix the edges of walkways and driveways and fix any cracks. Remove anything that might make you trip as you walk through a door, such as a raised step or threshold. Trim any bushes or trees on the path to your home. Use bright outdoor lighting. Clear any walking paths of anything that might make someone trip, such as rocks or tools. Regularly check to see if handrails are loose or broken. Make sure that both sides of any steps have handrails. Any raised decks and porches should have  guardrails on the edges. Have any leaves, snow, or ice cleared regularly. Use sand or salt on walking paths during winter. Clean up any spills in your garage right away. This includes oil or grease spills. What can I do in the bathroom? Use night lights. Install grab bars by the toilet and in the tub and shower. Do not use towel bars as grab bars. Use non-skid mats or decals in the tub or shower. If you need to sit down in the shower, use a plastic, non-slip stool. Keep the floor dry. Clean up any water that spills on the floor as soon as it happens. Remove soap buildup in the tub or shower regularly. Attach bath mats securely with double-sided non-slip rug tape. Do not have throw rugs and other things on the floor that can make you trip. What can I do in the bedroom? Use night lights. Make sure that you have a light by your bed that is easy to reach. Do not use any sheets or blankets that are too big for your bed. They should not hang down onto the floor. Have a firm chair that has  side arms. You can use this for support while you get dressed. Do not have throw rugs and other things on the floor that can make you trip. What can I do in the kitchen? Clean up any spills right away. Avoid walking on wet floors. Keep items that you use a lot in easy-to-reach places. If you need to reach something above you, use a strong step stool that has a grab bar. Keep electrical cords out of the way. Do not use floor polish or wax that makes floors slippery. If you must use wax, use non-skid floor wax. Do not have throw rugs and other things on the floor that can make you trip. What can I do with my stairs? Do not leave any items on the stairs. Make sure that there are handrails on both sides of the stairs and use them. Fix handrails that are broken or loose. Make sure that handrails are as long as the stairways. Check any carpeting to make sure that it is firmly attached to the stairs. Fix any carpet  that is loose or worn. Avoid having throw rugs at the top or bottom of the stairs. If you do have throw rugs, attach them to the floor with carpet tape. Make sure that you have a light switch at the top of the stairs and the bottom of the stairs. If you do not have them, ask someone to add them for you. What else can I do to help prevent falls? Wear shoes that: Do not have high heels. Have rubber bottoms. Are comfortable and fit you well. Are closed at the toe. Do not wear sandals. If you use a stepladder: Make sure that it is fully opened. Do not climb a closed stepladder. Make sure that both sides of the stepladder are locked into place. Ask someone to hold it for you, if possible. Clearly mark and make sure that you can see: Any grab bars or handrails. First and last steps. Where the edge of each step is. Use tools that help you move around (mobility aids) if they are needed. These include: Canes. Walkers. Scooters. Crutches. Turn on the lights when you go into a dark area. Replace any light bulbs as soon as they burn out. Set up your furniture so you have a clear path. Avoid moving your furniture around. If any of your floors are uneven, fix them. If there are any pets around you, be aware of where they are. Review your medicines with your doctor. Some medicines can make you feel dizzy. This can increase your chance of falling. Ask your doctor what other things that you can do to help prevent falls. This information is not intended to replace advice given to you by your health care provider. Make sure you discuss any questions you have with your health care provider. Document Released: 02/15/2009 Document Revised: 09/27/2015 Document Reviewed: 05/26/2014 Elsevier Interactive Patient Education  2017 [employer].

## 2022-02-18 ENCOUNTER — Ambulatory Visit (INDEPENDENT_AMBULATORY_CARE_PROVIDER_SITE_OTHER): Payer: PPO | Admitting: Emergency Medicine

## 2022-02-18 ENCOUNTER — Encounter: Payer: Self-pay | Admitting: Emergency Medicine

## 2022-02-18 VITALS — BP 146/96 | HR 85 | Temp 97.9°F | Ht 66.0 in | Wt 98.4 lb

## 2022-02-18 DIAGNOSIS — M810 Age-related osteoporosis without current pathological fracture: Secondary | ICD-10-CM

## 2022-02-18 DIAGNOSIS — J432 Centrilobular emphysema: Secondary | ICD-10-CM

## 2022-02-18 DIAGNOSIS — I700 Atherosclerosis of aorta: Secondary | ICD-10-CM

## 2022-02-18 DIAGNOSIS — E785 Hyperlipidemia, unspecified: Secondary | ICD-10-CM | POA: Diagnosis not present

## 2022-02-18 DIAGNOSIS — I10 Essential (primary) hypertension: Secondary | ICD-10-CM | POA: Diagnosis not present

## 2022-02-18 DIAGNOSIS — Z720 Tobacco use: Secondary | ICD-10-CM

## 2022-02-18 DIAGNOSIS — F4323 Adjustment disorder with mixed anxiety and depressed mood: Secondary | ICD-10-CM | POA: Diagnosis not present

## 2022-02-18 DIAGNOSIS — R634 Abnormal weight loss: Secondary | ICD-10-CM

## 2022-02-18 MED ORDER — ESCITALOPRAM OXALATE 10 MG PO TABS: 10.0000 mg | ORAL_TABLET | Freq: Every day | ORAL | 3 refills | Status: DC

## 2022-02-18 MED ORDER — METOPROLOL SUCCINATE ER 25 MG PO TB24: 25.0000 mg | ORAL_TABLET | Freq: Every day | ORAL | 3 refills | Status: DC

## 2022-02-18 MED ORDER — LOSARTAN POTASSIUM 25 MG PO TABS: 25.0000 mg | ORAL_TABLET | Freq: Every day | ORAL | 1 refills | Status: DC

## 2022-02-18 MED ORDER — ATORVASTATIN CALCIUM 40 MG PO TABS: 40.0000 mg | ORAL_TABLET | Freq: Every day | ORAL | 3 refills | Status: DC

## 2022-02-18 NOTE — Assessment & Plan Note (Signed)
Improving.  Continue Lexapro 10 mg daily.

## 2022-02-18 NOTE — Progress Notes (Signed)
Terry Armstrong 76 y.o.   Chief Complaint  Patient presents with   Follow-up    4 week f/u appt, pt experiencing itching on the palms of her hands    HISTORY OF PRESENT ILLNESS: This is a 76 y.o. female here for 4-week follow-up of situational anxiety and depression Just started on Lexapro 10 mg daily.  Working well for her. Still in the middle of legal issues regarding husband's recent passing. History of COPD.  Still smoking. History hypertension.  Ran out of medications. History of dyslipidemia.  Not taking atorvastatin. Has occasional itching to palms of her hands, mostly right hand. No other complaints or medical concerns today. Wt Readings from Last 3 Encounters:  02/18/22 98 lb 6 oz (44.6 kg)  02/17/22 100 lb (45.4 kg)  01/21/22 97 lb 8 oz (44.2 kg)     HPI   Prior to Admission medications   Medication Sig Start Date End Date Taking? Authorizing Provider  alendronate (FOSAMAX) 70 MG tablet Take one tablet once weekly. Take with a full glass of water on an empty stomach. 06/19/20  Yes Ngetich, Dinah C, NP  aspirin EC 81 MG tablet Take 1 tablet (81 mg total) by mouth daily. 06/28/19  Yes Stallings, Zoe A, MD  escitalopram (LEXAPRO) 10 MG tablet Take 1 tablet (10 mg total) by mouth daily. 01/21/22 03/22/22 Yes Cathaleen Korol, Ines Bloomer, MD  fluticasone [facility]) 50 MCG/ACT nasal spray Place 1 spray into both nostrils daily. 01/16/22  Yes Mound, Hildred Alamin E, FNP  Fluticasone-Umeclidin-Vilant (TRELEGY ELLIPTA) 100-62.5-25 MCG/ACT AEPB Inhale 1 puff into the lungs daily. 01/21/22  Yes Myers Tutterow, Ines Bloomer, MD  multivitamin-iron-minerals-folic acid (CENTRUM) chewable tablet Chew 1 tablet by mouth daily.   Yes [provider]  Omega 3 1000 MG CAPS Take by mouth daily.   Yes [provider]  albuterol (VENTOLIN HFA) 108 (90 Base) MCG/ACT inhaler INHALE 2 PUFFS INTO THE LUNGS  EVERY 6 HOURS AS NEEDED FOR WHEEZING FOR SHORTNESS OF BREATH Patient not taking: Reported on  02/18/2022 07/26/21   Ngetich, Dinah C, NP  atorvastatin (LIPITOR) 40 MG tablet Take 1 tablet (40 mg total) by mouth daily. Patient not taking: Reported on 02/17/2022 01/18/21   Ngetich, Dinah C, NP  losartan (COZAAR) 25 MG tablet Take 1 tablet (25 mg total) by mouth daily. Patient not taking: Reported on 02/17/2022 06/19/20   Ngetich, Dinah C, NP  metoprolol succinate (TOPROL-XL) 25 MG 24 hr tablet Take 1 tablet by mouth once daily Patient not taking: Reported on 02/17/2022 04/08/21   Skeet Latch, MD  tiotropium (SPIRIVA) 18 MCG inhalation capsule Place 1 capsule (18 mcg total) into inhaler and inhale as needed. Patient not taking: Reported on 02/17/2022 01/18/21   Ngetich, Nelda Bucks, NP    No Known Allergies  Patient Active Problem List   Diagnosis Date Noted   Situational mixed anxiety and depressive disorder 01/21/2022   Weight loss 01/21/2022   Aortic atherosclerosis ([place]) 06/19/2020   VT (ventricular tachycardia) ([place]) 09/11/2019   Exertional dyspnea 08/18/2019   Palpitations 08/18/2019   Hx of adenomatous colonic polyps 06/19/2017   Multinodular goiter 03/15/2017   Centrilobular emphysema ([place]) 11/09/2016   Periodic limb movement sleep disorder 01/11/2015   Abnormal TSH 01/11/2015   Upper airway resistance syndrome 01/11/2015   Mild obstructive sleep apnea 01/11/2015   Osteoporosis 01/10/2015   Non-restorative sleep 05/23/2013   Primary snoring    HTN (hypertension) 05/11/2013   DM (diabetes mellitus) ([place]) 06/04/2011   Tobacco user 06/04/2011  Hyperlipidemia LDL goal <70 06/04/2011    Past Medical History:  Diagnosis Date   Allergy    Breast abscess    Constipation    if drinks alot of water no issues - uses womens stool softener PRN only    Diabetes mellitus without complication ([place])    pt has no meds- diet controlled only    Exertional dyspnea 08/18/2019   H/O mammogram 05/06/2019   Per [facility] new patient packet   Hx of adenomatous colonic polyps 06/19/2017    Hypercholesteremia    Per [facility] new patient packet   Hyperlipidemia    Hypertension    Per [facility] new patient packet   Palpitations 08/18/2019   Primary snoring    Thyroid condition    Per [facility] new patient packet   VT (ventricular tachycardia) ([place]) 09/11/2019    Past Surgical History:  Procedure Laterality Date   ABDOMINAL HYSTERECTOMY  1993   COLONOSCOPY     POLYPECTOMY      Social History   Socioeconomic History   Marital status: Married    Spouse name: Not on file   Number of children: 0   Years of education: 14   Highest education level: Not on file  Occupational History    Employer: RETIRED  Tobacco Use   Smoking status: Every Day    Packs/day: 1.50    Years: 49.00    Total pack years: 73.50    Types: Cigarettes   Smokeless tobacco: Never   Tobacco comments:    1/2 ppd 01/16/20  Vaping Use   Vaping Use: Never used  Substance and Sexual Activity   Alcohol use: Yes    Comment: 2-4 per week   Drug use: No   Sexual activity: Not Currently  Other Topics Concern   Not on file  Social History Narrative   Diet       Do you drink/eat things with caffeine Coffee      Marital Status Married What year were you married? 2015      Do you live in a house, apartment, assisted living, condo, trailer, etc.? [place]      Is it one or more stories? Yes 3 stories      How many persons live in your home? 2        Do you have any pets in your home?(please list) No      Highest level of education completed: 2 years college      Current or past profession: [employer]      Do you exercise?: Yes    Type and how often: Daily      Do you have a Living Will? (Form that indicates scenarios where you would not want your life prolonged) No      Do you have a DNR form?         If not, would you like to discuss one? No      Do you have signed POA/HPOA forms? No      Do you have difficulty bathing or dressing yourself? No      Do you have difficulty preparing food or  eating? No      Do you have difficulty managing medications? No      Do you have difficulty managing your finances? No      Do you have difficulty affording your medications? No                     Social  Determinants of Health   Financial Resource Strain: Low Risk  (02/17/2022)   Overall Financial Resource Strain (CARDIA)    Difficulty of Paying Living Expenses: Not hard at all  Food Insecurity: No Food Insecurity (02/17/2022)   Hunger Vital Sign    Worried About Running Out of Food in the Last Year: Never true    Ran Out of Food in the Last Year: Never true  Transportation Needs: No Transportation Needs (02/17/2022)   PRAPARE - Hydrologist (Medical): No    Lack of Transportation (Non-Medical): No  Physical Activity: Insufficiently Active (02/17/2022)   Exercise Vital Sign    Days of Exercise per Week: 3 days    Minutes of Exercise per Session: 30 min  Stress: No Stress Concern Present (02/17/2022)   [place]    Feeling of Stress : Not at all  Social Connections: Moderately Isolated (02/17/2022)   Social Connection and Isolation Panel [NHANES]    Frequency of Communication with Friends and Family: More than three times a week    Frequency of Social Gatherings with Friends and Family: More than three times a week    Attends Religious Services: More than 4 times per year    Active Member of [employer] or Organizations: No    Attends Archivist Meetings: Never    Marital Status: Widowed  Intimate Partner Violence: Not At Risk (02/17/2022)   Humiliation, Afraid, Rape, and Kick questionnaire    Fear of Current or Ex-Partner: No    Emotionally Abused: No    Physically Abused: No    Sexually Abused: No    Family History  Problem Relation Age of Onset   Diabetes Mother    Diabetes Father    Diabetes Other    Hypertension Sister    Hyperlipidemia Other    Heart  disease Sister        defibrillator   Kidney failure Maternal Aunt        dialysis   Diabetes Maternal Aunt    Hypertension Maternal Aunt    Diabetes Maternal Aunt    Heart Problems Maternal Grandmother    Cancer Maternal Grandmother    Heart disease Maternal Grandmother    Hypertension Maternal Grandmother    Diabetes Brother    Colon cancer Neg Hx    Colon polyps Neg Hx    Rectal cancer Neg Hx    Stomach cancer Neg Hx      Review of Systems  Constitutional: Negative.  Negative for chills and fever.  HENT: Negative.  Negative for congestion and sore throat.   Respiratory: Negative.  Negative for cough and shortness of breath.   Cardiovascular: Negative.  Negative for chest pain and palpitations.  Gastrointestinal: Negative.  Negative for abdominal pain, nausea and vomiting.  Genitourinary: Negative.   Skin:  Positive for itching.  Neurological: Negative.  Negative for dizziness and headaches.  All other systems reviewed and are negative.  Today's Vitals   02/18/22 1049 02/18/22 1054  BP: (!) 150/98 (!) 146/96  Pulse: 85   Temp: 97.9 F (36.6 C)   TempSrc: Oral   SpO2: 93%   Weight: 98 lb 6 oz (44.6 kg)   Height: '5\' 6"'$  (1.676 m)    Body mass index is 15.88 kg/m.   Physical Exam Vitals reviewed.  Constitutional:      Appearance: Normal appearance.  HENT:     Head: Normocephalic.     Mouth/Throat:  Mouth: Mucous membranes are moist.     Pharynx: Oropharynx is clear.  Eyes:     Extraocular Movements: Extraocular movements intact.     Conjunctiva/sclera: Conjunctivae normal.     Pupils: Pupils are equal, round, and reactive to light.  Cardiovascular:     Rate and Rhythm: Normal rate and regular rhythm.     Pulses: Normal pulses.     Heart sounds: Normal heart sounds.  Pulmonary:     Effort: Pulmonary effort is normal.     Breath sounds: Normal breath sounds.  Musculoskeletal:     Cervical back: No tenderness.  Lymphadenopathy:     Cervical: No  cervical adenopathy.  Skin:    General: Skin is warm and dry.  Neurological:     General: No focal deficit present.     Mental Status: She is alert and oriented to person, place, and time.  Psychiatric:        Mood and Affect: Mood normal.        Behavior: Behavior normal.      ASSESSMENT & PLAN: A total of 47 minutes was spent with the patient and counseling/coordination of care regarding preparing for this visit, review of most recent office visit notes, review of multiple chronic medical problems and their management, review of all medications and changes made, education on nutrition, review of most recent blood work results, prognosis, documentation, and need for follow-up.  Problem List Items Addressed This Visit       Cardiovascular and Mediastinum   HTN (hypertension)    Elevated blood pressure readings in the office.  Not taking blood pressure medication. Resume losartan 25 mg and metoprolol succinate 25 mg daily. Cardiovascular risks associated with hypertension discussed. Diet and nutrition discussed. Follow-up in 3 months.      Relevant Medications   losartan (COZAAR) 25 MG tablet   metoprolol succinate (TOPROL-XL) 25 MG 24 hr tablet   atorvastatin (LIPITOR) 40 MG tablet   Aortic atherosclerosis ([facility])   Relevant Medications   losartan (COZAAR) 25 MG tablet   metoprolol succinate (TOPROL-XL) 25 MG 24 hr tablet   atorvastatin (LIPITOR) 40 MG tablet     Respiratory   Centrilobular emphysema ([facility])    Stable.  Continue Trelegy 1 puff daily.        Musculoskeletal and Integument   Osteoporosis    Stable.  Continue Fosamax 70 mg weekly.        Other   Tobacco user    Cardiovascular and cancer risks associated with smoking discussed.  Smoking cessation advice given.      Hyperlipidemia LDL goal <70    Presently not taking atorvastatin. Also has history of aortic atherosclerosis Resume atorvastatin 40 mg daily. The 10-year ASCVD risk score (Arnett DK, et  al., 2019) is: 73.9%   Values used to calculate the score:     Age: 32 years     Sex: Female     Is Non-Hispanic African American: Yes     Diabetic: Yes     Tobacco smoker: Yes     Systolic Blood Pressure: 366 mmHg     Is BP treated: Yes     HDL Cholesterol: 93.2 mg/dL     Total Cholesterol: 209 mg/dL       Relevant Medications   losartan (COZAAR) 25 MG tablet   metoprolol succinate (TOPROL-XL) 25 MG 24 hr tablet   atorvastatin (LIPITOR) 40 MG tablet   Situational mixed anxiety and depressive disorder - Primary    Improving.  Continue Lexapro 10 mg daily.      Relevant Medications   escitalopram (LEXAPRO) 10 MG tablet   Weight loss    Stable.  Trying to eat better. Unremarkable blood work results.  Reviewed with patient.      Other Visit Diagnoses     Essential hypertension       Relevant Medications   losartan (COZAAR) 25 MG tablet   metoprolol succinate (TOPROL-XL) 25 MG 24 hr tablet   atorvastatin (LIPITOR) 40 MG tablet      Patient Instructions  Hypertension, Adult High blood pressure (hypertension) is when the force of blood pumping through the arteries is too strong. The arteries are the blood vessels that carry blood from the heart throughout the body. Hypertension forces the heart to work harder to pump blood and may cause arteries to become narrow or stiff. Untreated or uncontrolled hypertension can lead to a heart attack, heart failure, a stroke, kidney disease, and other problems. A blood pressure reading consists of a higher number over a lower number. Ideally, your blood pressure should be below 120/80. The first ("top") number is called the systolic pressure. It is a measure of the pressure in your arteries as your heart beats. The second ("bottom") number is called the diastolic pressure. It is a measure of the pressure in your arteries as the heart relaxes. What are the causes? The exact cause of this condition is not known. There are some conditions that  result in high blood pressure. What increases the risk? Certain factors may make you more likely to develop high blood pressure. Some of these risk factors are under your control, including: Smoking. Not getting enough exercise or physical activity. Being overweight. Having too much fat, sugar, calories, or salt (sodium) in your diet. Drinking too much alcohol. Other risk factors include: Having a personal history of heart disease, diabetes, high cholesterol, or kidney disease. Stress. Having a family history of high blood pressure and high cholesterol. Having obstructive sleep apnea. Age. The risk increases with age. What are the signs or symptoms? High blood pressure may not cause symptoms. Very high blood pressure (hypertensive crisis) may cause: Headache. Fast or irregular heartbeats (palpitations). Shortness of breath. Nosebleed. Nausea and vomiting. Vision changes. Severe chest pain, dizziness, and seizures. How is this diagnosed? This condition is diagnosed by measuring your blood pressure while you are seated, with your arm resting on a flat surface, your legs uncrossed, and your feet flat on the floor. The cuff of the blood pressure monitor will be placed directly against the skin of your upper arm at the level of your heart. Blood pressure should be measured at least twice using the same arm. Certain conditions can cause a difference in blood pressure between your right and left arms. If you have a high blood pressure reading during one visit or you have normal blood pressure with other risk factors, you may be asked to: Return on a different day to have your blood pressure checked again. Monitor your blood pressure at home for 1 week or longer. If you are diagnosed with hypertension, you may have other blood or imaging tests to help your health care provider understand your overall risk for other conditions. How is this treated? This condition is treated by making healthy  lifestyle changes, such as eating healthy foods, exercising more, and reducing your alcohol intake. You may be referred for counseling on a healthy diet and physical activity. Your health care provider may prescribe medicine if lifestyle  changes are not enough to get your blood pressure under control and if: Your systolic blood pressure is above 130. Your diastolic blood pressure is above 80. Your personal target blood pressure may vary depending on your medical conditions, your age, and other factors. Follow these instructions at home: Eating and drinking  Eat a diet that is high in fiber and potassium, and low in sodium, added sugar, and fat. An example of this eating plan is called the DASH diet. DASH stands for Dietary Approaches to Stop Hypertension. To eat this way: Eat plenty of fresh fruits and vegetables. Try to fill one half of your plate at each meal with fruits and vegetables. Eat whole grains, such as whole-wheat pasta, brown rice, or whole-grain bread. Fill about one fourth of your plate with whole grains. Eat or drink low-fat dairy products, such as skim milk or low-fat yogurt. Avoid fatty cuts of meat, processed or cured meats, and poultry with skin. Fill about one fourth of your plate with lean proteins, such as fish, chicken without skin, beans, eggs, or tofu. Avoid pre-made and processed foods. These tend to be higher in sodium, added sugar, and fat. Reduce your daily sodium intake. Many people with hypertension should eat less than 1,500 mg of sodium a day. Do not drink alcohol if: Your health care provider tells you not to drink. You are pregnant, may be pregnant, or are planning to become pregnant. If you drink alcohol: Limit how much you have to: 0-1 drink a day for women. 0-2 drinks a day for men. Know how much alcohol is in your drink. In the [place] one drink equals one 12 oz bottle of beer (355 mL), one 5 oz glass of wine (148 mL), or one 1 oz glass of hard liquor (44  mL). Lifestyle  Work with your health care provider to maintain a healthy body weight or to lose weight. Ask what an ideal weight is for you. Get at least 30 minutes of exercise that causes your heart to beat faster (aerobic exercise) most days of the week. Activities may include walking, swimming, or biking. Include exercise to strengthen your muscles (resistance exercise), such as Pilates or lifting weights, as part of your weekly exercise routine. Try to do these types of exercises for 30 minutes at least 3 days a week. Do not use any products that contain nicotine or tobacco. These products include cigarettes, chewing tobacco, and vaping devices, such as e-cigarettes. If you need help quitting, ask your health care provider. Monitor your blood pressure at home as told by your health care provider. Keep all follow-up visits. This is important. Medicines Take over-the-counter and prescription medicines only as told by your health care provider. Follow directions carefully. Blood pressure medicines must be taken as prescribed. Do not skip doses of blood pressure medicine. Doing this puts you at risk for problems and can make the medicine less effective. Ask your health care provider about side effects or reactions to medicines that you should watch for. Contact a health care provider if you: Think you are having a reaction to a medicine you are taking. Have headaches that keep coming back (recurring). Feel dizzy. Have swelling in your ankles. Have trouble with your vision. Get help right away if you: Develop a severe headache or confusion. Have unusual weakness or numbness. Feel faint. Have severe pain in your chest or abdomen. Vomit repeatedly. Have trouble breathing. These symptoms may be an emergency. Get help right away. Call 911. Do not  wait to see if the symptoms will go away. Do not drive yourself to the hospital. Summary Hypertension is when the force of blood pumping through  your arteries is too strong. If this condition is not controlled, it may put you at risk for serious complications. Your personal target blood pressure may vary depending on your medical conditions, your age, and other factors. For most people, a normal blood pressure is less than 120/80. Hypertension is treated with lifestyle changes, medicines, or a combination of both. Lifestyle changes include losing weight, eating a healthy, low-sodium diet, exercising more, and limiting alcohol. This information is not intended to replace advice given to you by your health care provider. Make sure you discuss any questions you have with your health care provider. Document Revised: 02/26/2021 Document Reviewed: 02/26/2021 Elsevier Patient Education  [place], MD [facility]

## 2022-02-18 NOTE — Assessment & Plan Note (Signed)
Stable.  Continue Trelegy 1 puff daily.

## 2022-02-18 NOTE — Assessment & Plan Note (Signed)
Presently not taking atorvastatin. Also has history of aortic atherosclerosis Resume atorvastatin 40 mg daily. The 10-year ASCVD risk score (Arnett DK, et al., 2019) is: 73.9%   Values used to calculate the score:     Age: 76 years     Sex: Female     Is Non-Hispanic African American: Yes     Diabetic: Yes     Tobacco smoker: Yes     Systolic Blood Pressure: 004 mmHg     Is BP treated: Yes     HDL Cholesterol: 93.2 mg/dL     Total Cholesterol: 209 mg/dL

## 2022-02-18 NOTE — Assessment & Plan Note (Signed)
Stable.  Continue Fosamax 70 mg weekly 

## 2022-02-18 NOTE — Patient Instructions (Signed)
Hypertension, Adult High blood pressure (hypertension) is when the force of blood pumping through the arteries is too strong. The arteries are the blood vessels that carry blood from the heart throughout the body. Hypertension forces the heart to work harder to pump blood and may cause arteries to become narrow or stiff. Untreated or uncontrolled hypertension can lead to a heart attack, heart failure, a stroke, kidney disease, and other problems. A blood pressure reading consists of a higher number over a lower number. Ideally, your blood pressure should be below 120/80. The first ("top") number is called the systolic pressure. It is a measure of the pressure in your arteries as your heart beats. The second ("bottom") number is called the diastolic pressure. It is a measure of the pressure in your arteries as the heart relaxes. What are the causes? The exact cause of this condition is not known. There are some conditions that result in high blood pressure. What increases the risk? Certain factors may make you more likely to develop high blood pressure. Some of these risk factors are under your control, including: Smoking. Not getting enough exercise or physical activity. Being overweight. Having too much fat, sugar, calories, or salt (sodium) in your diet. Drinking too much alcohol. Other risk factors include: Having a personal history of heart disease, diabetes, high cholesterol, or kidney disease. Stress. Having a family history of high blood pressure and high cholesterol. Having obstructive sleep apnea. Age. The risk increases with age. What are the signs or symptoms? High blood pressure may not cause symptoms. Very high blood pressure (hypertensive crisis) may cause: Headache. Fast or irregular heartbeats (palpitations). Shortness of breath. Nosebleed. Nausea and vomiting. Vision changes. Severe chest pain, dizziness, and seizures. How is this diagnosed? This condition is diagnosed by  measuring your blood pressure while you are seated, with your arm resting on a flat surface, your legs uncrossed, and your feet flat on the floor. The cuff of the blood pressure monitor will be placed directly against the skin of your upper arm at the level of your heart. Blood pressure should be measured at least twice using the same arm. Certain conditions can cause a difference in blood pressure between your right and left arms. If you have a high blood pressure reading during one visit or you have normal blood pressure with other risk factors, you may be asked to: Return on a different day to have your blood pressure checked again. Monitor your blood pressure at home for 1 week or longer. If you are diagnosed with hypertension, you may have other blood or imaging tests to help your health care provider understand your overall risk for other conditions. How is this treated? This condition is treated by making healthy lifestyle changes, such as eating healthy foods, exercising more, and reducing your alcohol intake. You may be referred for counseling on a healthy diet and physical activity. Your health care provider may prescribe medicine if lifestyle changes are not enough to get your blood pressure under control and if: Your systolic blood pressure is above 130. Your diastolic blood pressure is above 80. Your personal target blood pressure may vary depending on your medical conditions, your age, and other factors. Follow these instructions at home: Eating and drinking  Eat a diet that is high in fiber and potassium, and low in sodium, added sugar, and fat. An example of this eating plan is called the DASH diet. DASH stands for Dietary Approaches to Stop Hypertension. To eat this way: Eat   plenty of fresh fruits and vegetables. Try to fill one half of your plate at each meal with fruits and vegetables. Eat whole grains, such as whole-wheat pasta, brown rice, or whole-grain bread. Fill about one  fourth of your plate with whole grains. Eat or drink low-fat dairy products, such as skim milk or low-fat yogurt. Avoid fatty cuts of meat, processed or cured meats, and poultry with skin. Fill about one fourth of your plate with lean proteins, such as fish, chicken without skin, beans, eggs, or tofu. Avoid pre-made and processed foods. These tend to be higher in sodium, added sugar, and fat. Reduce your daily sodium intake. Many people with hypertension should eat less than 1,500 mg of sodium a day. Do not drink alcohol if: Your health care provider tells you not to drink. You are pregnant, may be pregnant, or are planning to become pregnant. If you drink alcohol: Limit how much you have to: 0-1 drink a day for women. 0-2 drinks a day for men. Know how much alcohol is in your drink. In the [place] one drink equals one 12 oz bottle of beer (355 mL), one 5 oz glass of wine (148 mL), or one 1 oz glass of hard liquor (44 mL). Lifestyle  Work with your health care provider to maintain a healthy body weight or to lose weight. Ask what an ideal weight is for you. Get at least 30 minutes of exercise that causes your heart to beat faster (aerobic exercise) most days of the week. Activities may include walking, swimming, or biking. Include exercise to strengthen your muscles (resistance exercise), such as Pilates or lifting weights, as part of your weekly exercise routine. Try to do these types of exercises for 30 minutes at least 3 days a week. Do not use any products that contain nicotine or tobacco. These products include cigarettes, chewing tobacco, and vaping devices, such as e-cigarettes. If you need help quitting, ask your health care provider. Monitor your blood pressure at home as told by your health care provider. Keep all follow-up visits. This is important. Medicines Take over-the-counter and prescription medicines only as told by your health care provider. Follow directions carefully. Blood  pressure medicines must be taken as prescribed. Do not skip doses of blood pressure medicine. Doing this puts you at risk for problems and can make the medicine less effective. Ask your health care provider about side effects or reactions to medicines that you should watch for. Contact a health care provider if you: Think you are having a reaction to a medicine you are taking. Have headaches that keep coming back (recurring). Feel dizzy. Have swelling in your ankles. Have trouble with your vision. Get help right away if you: Develop a severe headache or confusion. Have unusual weakness or numbness. Feel faint. Have severe pain in your chest or abdomen. Vomit repeatedly. Have trouble breathing. These symptoms may be an emergency. Get help right away. Call 911. Do not wait to see if the symptoms will go away. Do not drive yourself to the hospital. Summary Hypertension is when the force of blood pumping through your arteries is too strong. If this condition is not controlled, it may put you at risk for serious complications. Your personal target blood pressure may vary depending on your medical conditions, your age, and other factors. For most people, a normal blood pressure is less than 120/80. Hypertension is treated with lifestyle changes, medicines, or a combination of both. Lifestyle changes include losing weight, eating a healthy,   low-sodium diet, exercising more, and limiting alcohol. This information is not intended to replace advice given to you by your health care provider. Make sure you discuss any questions you have with your health care provider. Document Revised: 02/26/2021 Document Reviewed: 02/26/2021 Elsevier Patient Education  2023 Elsevier Inc[employer]  

## 2022-02-18 NOTE — Assessment & Plan Note (Signed)
Cardiovascular and cancer risks associated with smoking discussed. °Smoking cessation advice given. °

## 2022-02-18 NOTE — Assessment & Plan Note (Signed)
Stable.  Trying to eat better. Unremarkable blood work results.  Reviewed with patient.

## 2022-02-18 NOTE — Assessment & Plan Note (Signed)
Elevated blood pressure readings in the office.  Not taking blood pressure medication. Resume losartan 25 mg and metoprolol succinate 25 mg daily. Cardiovascular risks associated with hypertension discussed. Diet and nutrition discussed. Follow-up in 3 months.

## 2022-02-19 ENCOUNTER — Encounter: Payer: Self-pay | Admitting: *Deleted

## 2022-03-04 ENCOUNTER — Ambulatory Visit (INDEPENDENT_AMBULATORY_CARE_PROVIDER_SITE_OTHER): Payer: PPO | Admitting: Emergency Medicine

## 2022-03-04 ENCOUNTER — Encounter: Payer: Self-pay | Admitting: Emergency Medicine

## 2022-03-04 VITALS — BP 138/76 | HR 77 | Temp 98.2°F | Ht 66.0 in | Wt 100.5 lb

## 2022-03-04 DIAGNOSIS — Z720 Tobacco use: Secondary | ICD-10-CM

## 2022-03-04 DIAGNOSIS — I10 Essential (primary) hypertension: Secondary | ICD-10-CM

## 2022-03-04 DIAGNOSIS — M810 Age-related osteoporosis without current pathological fracture: Secondary | ICD-10-CM

## 2022-03-04 DIAGNOSIS — R040 Epistaxis: Secondary | ICD-10-CM | POA: Diagnosis not present

## 2022-03-04 DIAGNOSIS — J432 Centrilobular emphysema: Secondary | ICD-10-CM

## 2022-03-04 DIAGNOSIS — I700 Atherosclerosis of aorta: Secondary | ICD-10-CM | POA: Diagnosis not present

## 2022-03-04 NOTE — Progress Notes (Signed)
Terry Armstrong 76 y.o.   Chief Complaint  Patient presents with   Epistaxis    Patient states she is getting nose bleeds on her left nostril     HISTORY OF PRESENT ILLNESS: Acute problem visit today. This is a 76 y.o. female complaining of intermittent left-sided nosebleeds for the past week. List of medications include daily baby aspirin.  No other blood thinners. No other associated symptoms. No other complaints or medical concerns today.  Epistaxis      Prior to Admission medications   Medication Sig Start Date End Date Taking? Authorizing Provider  alendronate (FOSAMAX) 70 MG tablet Take one tablet once weekly. Take with a full glass of water on an empty stomach. 06/19/20  Yes Ngetich, Dinah C, NP  aspirin EC 81 MG tablet Take 1 tablet (81 mg total) by mouth daily. 06/28/19  Yes Delia Chimes A, MD  atorvastatin (LIPITOR) 40 MG tablet Take 1 tablet (40 mg total) by mouth daily. 02/18/22  Yes Geneveive Furness, Ines Bloomer, MD  escitalopram (LEXAPRO) 10 MG tablet Take 1 tablet (10 mg total) by mouth daily. 02/18/22 02/13/23 Yes Josue Falconi, Ines Bloomer, MD  fluticasone [facility]) 50 MCG/ACT nasal spray Place 1 spray into both nostrils daily. 01/16/22  Yes [place], Hildred Alamin E, FNP  Fluticasone-Umeclidin-Vilant (TRELEGY ELLIPTA) 100-62.5-25 MCG/ACT AEPB Inhale 1 puff into the lungs daily. 01/21/22  Yes Merita Hawks, Ines Bloomer, MD  losartan (COZAAR) 25 MG tablet Take 1 tablet (25 mg total) by mouth daily. 02/18/22  Yes Erle Guster, Ines Bloomer, MD  metoprolol succinate (TOPROL-XL) 25 MG 24 hr tablet Take 1 tablet (25 mg total) by mouth daily. 02/18/22 02/13/23 Yes Chayanne Filippi, Ines Bloomer, MD  multivitamin-iron-minerals-folic acid (CENTRUM) chewable tablet Chew 1 tablet by mouth daily.   Yes [provider]  Omega 3 1000 MG CAPS Take by mouth daily.   Yes [provider]  albuterol (VENTOLIN HFA) 108 (90 Base) MCG/ACT inhaler INHALE 2 PUFFS INTO THE LUNGS  EVERY 6 HOURS AS NEEDED FOR WHEEZING  FOR SHORTNESS OF BREATH Patient not taking: Reported on 03/04/2022 07/26/21   Ngetich, Nelda Bucks, NP    No Known Allergies  Patient Active Problem List   Diagnosis Date Noted   Situational mixed anxiety and depressive disorder 01/21/2022   Weight loss 01/21/2022   Aortic atherosclerosis ([place]) 06/19/2020   VT (ventricular tachycardia) ([place]) 09/11/2019   Exertional dyspnea 08/18/2019   Palpitations 08/18/2019   Hx of adenomatous colonic polyps 06/19/2017   Multinodular goiter 03/15/2017   Centrilobular emphysema ([place]) 11/09/2016   Periodic limb movement sleep disorder 01/11/2015   Abnormal TSH 01/11/2015   Upper airway resistance syndrome 01/11/2015   Mild obstructive sleep apnea 01/11/2015   Osteoporosis 01/10/2015   Non-restorative sleep 05/23/2013   Primary snoring    HTN (hypertension) 05/11/2013   DM (diabetes mellitus) ([place]) 06/04/2011   Tobacco user 06/04/2011   Hyperlipidemia LDL goal <70 06/04/2011    Past Medical History:  Diagnosis Date   Allergy    Breast abscess    Constipation    if drinks alot of water no issues - uses womens stool softener PRN only    Diabetes mellitus without complication ([place])    pt has no meds- diet controlled only    Exertional dyspnea 08/18/2019   H/O mammogram 05/06/2019   Per [place] new patient packet   Hx of adenomatous colonic polyps 06/19/2017   Hypercholesteremia    Per [place] new patient packet   Hyperlipidemia    Hypertension    Per  [place] new patient packet   Palpitations 08/18/2019   Primary snoring    Thyroid condition    Per [facility] new patient packet   VT (ventricular tachycardia) ([place]) 09/11/2019    Past Surgical History:  Procedure Laterality Date   ABDOMINAL HYSTERECTOMY  1993   COLONOSCOPY     POLYPECTOMY      Social History   Socioeconomic History   Marital status: Married    Spouse name: Not on file   Number of children: 0   Years of education: 14   Highest education level: Not on file  Occupational History     Employer: RETIRED  Tobacco Use   Smoking status: Every Day    Packs/day: 1.50    Years: 49.00    Total pack years: 73.50    Types: Cigarettes   Smokeless tobacco: Never   Tobacco comments:    1/2 ppd 01/16/20  Vaping Use   Vaping Use: Never used  Substance and Sexual Activity   Alcohol use: Yes    Comment: 2-4 per week   Drug use: No   Sexual activity: Not Currently  Other Topics Concern   Not on file  Social History Narrative   Diet       Do you drink/eat things with caffeine Coffee      Marital Status Married What year were you married? 2015      Do you live in a house, apartment, assisted living, condo, trailer, etc.? [place]      Is it one or more stories? Yes 3 stories      How many persons live in your home? 2        Do you have any pets in your home?(please list) No      Highest level of education completed: 2 years college      Current or past profession: [employer]      Do you exercise?: Yes    Type and how often: Daily      Do you have a Living Will? (Form that indicates scenarios where you would not want your life prolonged) No      Do you have a DNR form?         If not, would you like to discuss one? No      Do you have signed POA/HPOA forms? No      Do you have difficulty bathing or dressing yourself? No      Do you have difficulty preparing food or eating? No      Do you have difficulty managing medications? No      Do you have difficulty managing your finances? No      Do you have difficulty affording your medications? No                     Social Determinants of Health   Financial Resource Strain: Low Risk  (02/17/2022)   Overall Financial Resource Strain (CARDIA)    Difficulty of Paying Living Expenses: Not hard at all  Food Insecurity: No Food Insecurity (02/17/2022)   Hunger Vital Sign    Worried About Running Out of Food in the Last Year: Never true    Ran Out of Food in the Last Year: Never true  Transportation  Needs: No Transportation Needs (02/17/2022)   PRAPARE - Hydrologist (Medical): No    Lack of Transportation (Non-Medical): No  Physical Activity: Insufficiently Active (02/17/2022)   Exercise Vital  Sign    Days of Exercise per Week: 3 days    Minutes of Exercise per Session: 30 min  Stress: No Stress Concern Present (02/17/2022)   [place]    Feeling of Stress : Not at all  Social Connections: Moderately Isolated (02/17/2022)   Social Connection and Isolation Panel [NHANES]    Frequency of Communication with Friends and Family: More than three times a week    Frequency of Social Gatherings with Friends and Family: More than three times a week    Attends Religious Services: More than 4 times per year    Active Member of [employer] or Organizations: No    Attends Archivist Meetings: Never    Marital Status: Widowed  Intimate Partner Violence: Not At Risk (02/17/2022)   Humiliation, Afraid, Rape, and Kick questionnaire    Fear of Current or Ex-Partner: No    Emotionally Abused: No    Physically Abused: No    Sexually Abused: No    Family History  Problem Relation Age of Onset   Diabetes Mother    Diabetes Father    Diabetes Other    Hypertension Sister    Hyperlipidemia Other    Heart disease Sister        defibrillator   Kidney failure Maternal Aunt        dialysis   Diabetes Maternal Aunt    Hypertension Maternal Aunt    Diabetes Maternal Aunt    Heart Problems Maternal Grandmother    Cancer Maternal Grandmother    Heart disease Maternal Grandmother    Hypertension Maternal Grandmother    Diabetes Brother    Colon cancer Neg Hx    Colon polyps Neg Hx    Rectal cancer Neg Hx    Stomach cancer Neg Hx      Review of Systems  Constitutional: Negative.  Negative for chills and fever.  HENT:  Positive for nosebleeds.   Respiratory: Negative.  Negative for cough,  hemoptysis and shortness of breath.   Cardiovascular: Negative.  Negative for chest pain and palpitations.  Gastrointestinal:  Negative for abdominal pain, diarrhea, nausea and vomiting.  Genitourinary: Negative.  Negative for hematuria.  Skin: Negative.  Negative for rash.  Neurological: Negative.  Negative for dizziness and headaches.  All other systems reviewed and are negative.  Today's Vitals   03/04/22 1027  BP: 138/76  Pulse: 77  Temp: 98.2 F (36.8 C)  TempSrc: Oral  SpO2: 93%  Weight: 100 lb 8 oz (45.6 kg)  Height: '5\' 6"'$  (1.676 m)   Body mass index is 16.22 kg/m.   Physical Exam Vitals reviewed.  Constitutional:      Appearance: Normal appearance.  HENT:     Head: Normocephalic.     Nose: Nose normal. No congestion.     Mouth/Throat:     Mouth: Mucous membranes are moist.     Pharynx: Oropharynx is clear.  Eyes:     Extraocular Movements: Extraocular movements intact.  Cardiovascular:     Rate and Rhythm: Normal rate.  Pulmonary:     Effort: Pulmonary effort is normal.  Musculoskeletal:     Cervical back: No tenderness.  Skin:    General: Skin is warm and dry.     Capillary Refill: Capillary refill takes less than 2 seconds.  Neurological:     General: No focal deficit present.     Mental Status: She is alert and oriented to person, place, and  time.  Psychiatric:        Mood and Affect: Mood normal.        Behavior: Behavior normal.      ASSESSMENT & PLAN: A total of 45 minutes was spent with the patient and counseling/coordination of care regarding preparing for this visit, review of most recent office visit notes, review of multiple chronic medical problems and their management, review of all medications, differential diagnosis of epistaxis and management, prognosis, documentation, and need for follow-up.  Problem List Items Addressed This Visit       Cardiovascular and Mediastinum   HTN (hypertension)    Well-controlled hypertension. Continue  metoprolol succinate 25 mg daily and losartan 25 mg daily BP Readings from Last 3 Encounters:  03/04/22 138/76  02/18/22 (!) 146/96  01/21/22 132/78         Aortic atherosclerosis (HCC)    Stable.  Diet and nutrition discussed. Continue atorvastatin 40 mg daily.        Respiratory   Centrilobular emphysema (HCC)    Stable.  Continue daily Trelegy No recent need for albuterol        Musculoskeletal and Integument   Osteoporosis    Stable.  Continue Fosamax 70 mg weekly        Other   Tobacco user    Cardiovascular and cancer risks associated with smoking discussed. Smoking cessation advice given.      Epistaxis - Primary    Stable and well-controlled. Normotensive. Continue daily baby aspirin. Recommend to use saline nasal spray several times a day      Patient Instructions  Nosebleed, Adult A nosebleed is when blood comes out of the nose. Nosebleeds are common and can be caused by many things. They are usually not a sign of a serious medical problem. Follow these instructions at home: When you have a nosebleed:  Sit down. Tilt your head forward a little. Follow these steps: Pinch your nose with a clean towel or tissue. Keep pinching your nose for 5 minutes. Do not let go. After 5 minutes, let go of your nose. Keep doing these steps until the bleeding stops. Do not put tissues or other things in your nose to stop the bleeding. Avoid lying down or putting your head back. Use a nose spray decongestant as told by your doctor. After a nosebleed: Try not to blow your nose or sniffle for several hours. Try not to strain, lift, or bend at the waist for several days. Aspirin and medicines that thin your blood make bleeding more likely. If you take these medicines: Ask your doctor if you should stop taking them or if you should change how much you take. Do not stop taking the medicine unless your doctor tells you to. If your nosebleed was caused by dryness, use  over-the-counter saline nasal spray or gel and a humidifier as told by your doctor. This will keep the inside of your nose moist and allow it to heal. If you need to use nasal spray or gel: Choose one that is water-soluble. Use only as much as you need and use it only as often as needed. Do not lie down right away after you use it. If you get nosebleeds often, talk with your doctor about treatments. These may include: Nasal cautery. A chemical swab or electrical device is used to lightly burn tiny blood vessels inside the nose. This helps stop or prevent nosebleeds. Nasal packing. A gauze or other material is placed in the nose to keep  constant pressure on the bleeding area. Contact a doctor if: You have a fever. You get nosebleeds often. You get nosebleeds more often than usual. You bruise very easily. You have something stuck in your nose. You are bleeding in your mouth. You vomit or cough up brown material. You get a nosebleed after you start a new medicine. Get help right away if: You have a nosebleed after you fall or hurt your head. Your nosebleed does not go away after 20 minutes. You feel dizzy or weak. You have unusual bleeding from other parts of your body. You have unusual bruising on other parts of your body. You get sweaty. You vomit blood. Summary Nosebleeds are common. They are usually not a sign of a serious medical problem. When you have a nosebleed, sit down and tilt your head a little forward. Pinch your nose with a clean tissue for 5 minutes. Use saline spray or saline gel and a humidifier as told by your doctor. Get help right away if your nosebleed does not go away after 20 minutes. This information is not intended to replace advice given to you by your health care provider. Make sure you discuss any questions you have with your health care provider. Document Revised: 04/30/2021 Document Reviewed: 04/30/2021 Elsevier Patient Education  2023 [place], MD [facility]

## 2022-03-04 NOTE — Assessment & Plan Note (Signed)
Stable.  Diet and nutrition discussed. Continue atorvastatin 40 mg daily. 

## 2022-03-04 NOTE — Patient Instructions (Signed)
Nosebleed, Adult ?A nosebleed is when blood comes out of the nose. Nosebleeds are common and can be caused by many things. They are usually not a sign of a serious medical problem. ?Follow these instructions at home: ?When you have a nosebleed: ? ?Sit down. ?Tilt your head forward a little. ?Follow these steps: ?Pinch your nose with a clean towel or tissue. ?Keep pinching your nose for 5 minutes. Do not let go. ?After 5 minutes, let go of your nose. ?Keep doing these steps until the bleeding stops. ?Do not put tissues or other things in your nose to stop the bleeding. ?Avoid lying down or putting your head back. ?Use a nose spray decongestant as told by your doctor. ?After a nosebleed: ?Try not to blow your nose or sniffle for several hours. ?Try not to strain, lift, or bend at the waist for several days. ?Aspirin and medicines that thin your blood make bleeding more likely. If you take these medicines: ?Ask your doctor if you should stop taking them or if you should change how much you take. ?Do not stop taking the medicine unless your doctor tells you to. ?If your nosebleed was caused by dryness, use over-the-counter saline nasal spray or gel and a humidifier as told by your doctor. This will keep the inside of your nose moist and allow it to heal. If you need to use nasal spray or gel: ?Choose one that is water-soluble. ?Use only as much as you need and use it only as often as needed. ?Do not lie down right away after you use it. ?If you get nosebleeds often, talk with your doctor about treatments. These may include: ?Nasal cautery. A chemical swab or electrical device is used to lightly burn tiny blood vessels inside the nose. This helps stop or prevent nosebleeds. ?Nasal packing. A gauze or other material is placed in the nose to keep constant pressure on the bleeding area. ?Contact a doctor if: ?You have a fever. ?You get nosebleeds often. ?You get nosebleeds more often than usual. ?You bruise very  easily. ?You have something stuck in your nose. ?You are bleeding in your mouth. ?You vomit or cough up brown material. ?You get a nosebleed after you start a new medicine. ?Get help right away if: ?You have a nosebleed after you fall or hurt your head. ?Your nosebleed does not go away after 20 minutes. ?You feel dizzy or weak. ?You have unusual bleeding from other parts of your body. ?You have unusual bruising on other parts of your body. ?You get sweaty. ?You vomit blood. ?Summary ?Nosebleeds are common. They are usually not a sign of a serious medical problem. ?When you have a nosebleed, sit down and tilt your head a little forward. Pinch your nose with a clean tissue for 5 minutes. ?Use saline spray or saline gel and a humidifier as told by your doctor. ?Get help right away if your nosebleed does not go away after 20 minutes. ?This information is not intended to replace advice given to you by your health care provider. Make sure you discuss any questions you have with your health care provider. ?Document Revised: 04/30/2021 Document Reviewed: 04/30/2021 ?Elsevier Patient Education ? 2023 Elsevier Inc. ? ?

## 2022-03-04 NOTE — Assessment & Plan Note (Signed)
Stable.  Continue daily Trelegy No recent need for albuterol

## 2022-03-04 NOTE — Assessment & Plan Note (Signed)
Well-controlled hypertension. Continue metoprolol succinate 25 mg daily and losartan 25 mg daily BP Readings from Last 3 Encounters:  03/04/22 138/76  02/18/22 (!) 146/96  01/21/22 132/78

## 2022-03-04 NOTE — Assessment & Plan Note (Signed)
Stable.  Continue Fosamax 70 mg weekly

## 2022-03-04 NOTE — Assessment & Plan Note (Signed)
Cardiovascular and cancer risks associated with smoking discussed. °Smoking cessation advice given. °

## 2022-03-04 NOTE — Assessment & Plan Note (Signed)
Stable and well-controlled. Normotensive. Continue daily baby aspirin. Recommend to use saline nasal spray several times a day

## 2022-03-06 ENCOUNTER — Other Ambulatory Visit: Payer: Self-pay | Admitting: Family

## 2022-03-06 DIAGNOSIS — J432 Centrilobular emphysema: Secondary | ICD-10-CM

## 2022-03-26 IMAGING — DX DG CHEST 2V
2 series · 2 of 2 positions shown · non-contrast
Comparison: CT chest 03/23/2019

CLINICAL DATA: Monitor head a different "rhy." Additional history
provided: Patient has been wearing a heart monitor for night sweats,
was awakened this morning and told to come to the ED, shortness of
breath.

EXAM:
CHEST - 2 VIEW

[chest pa]
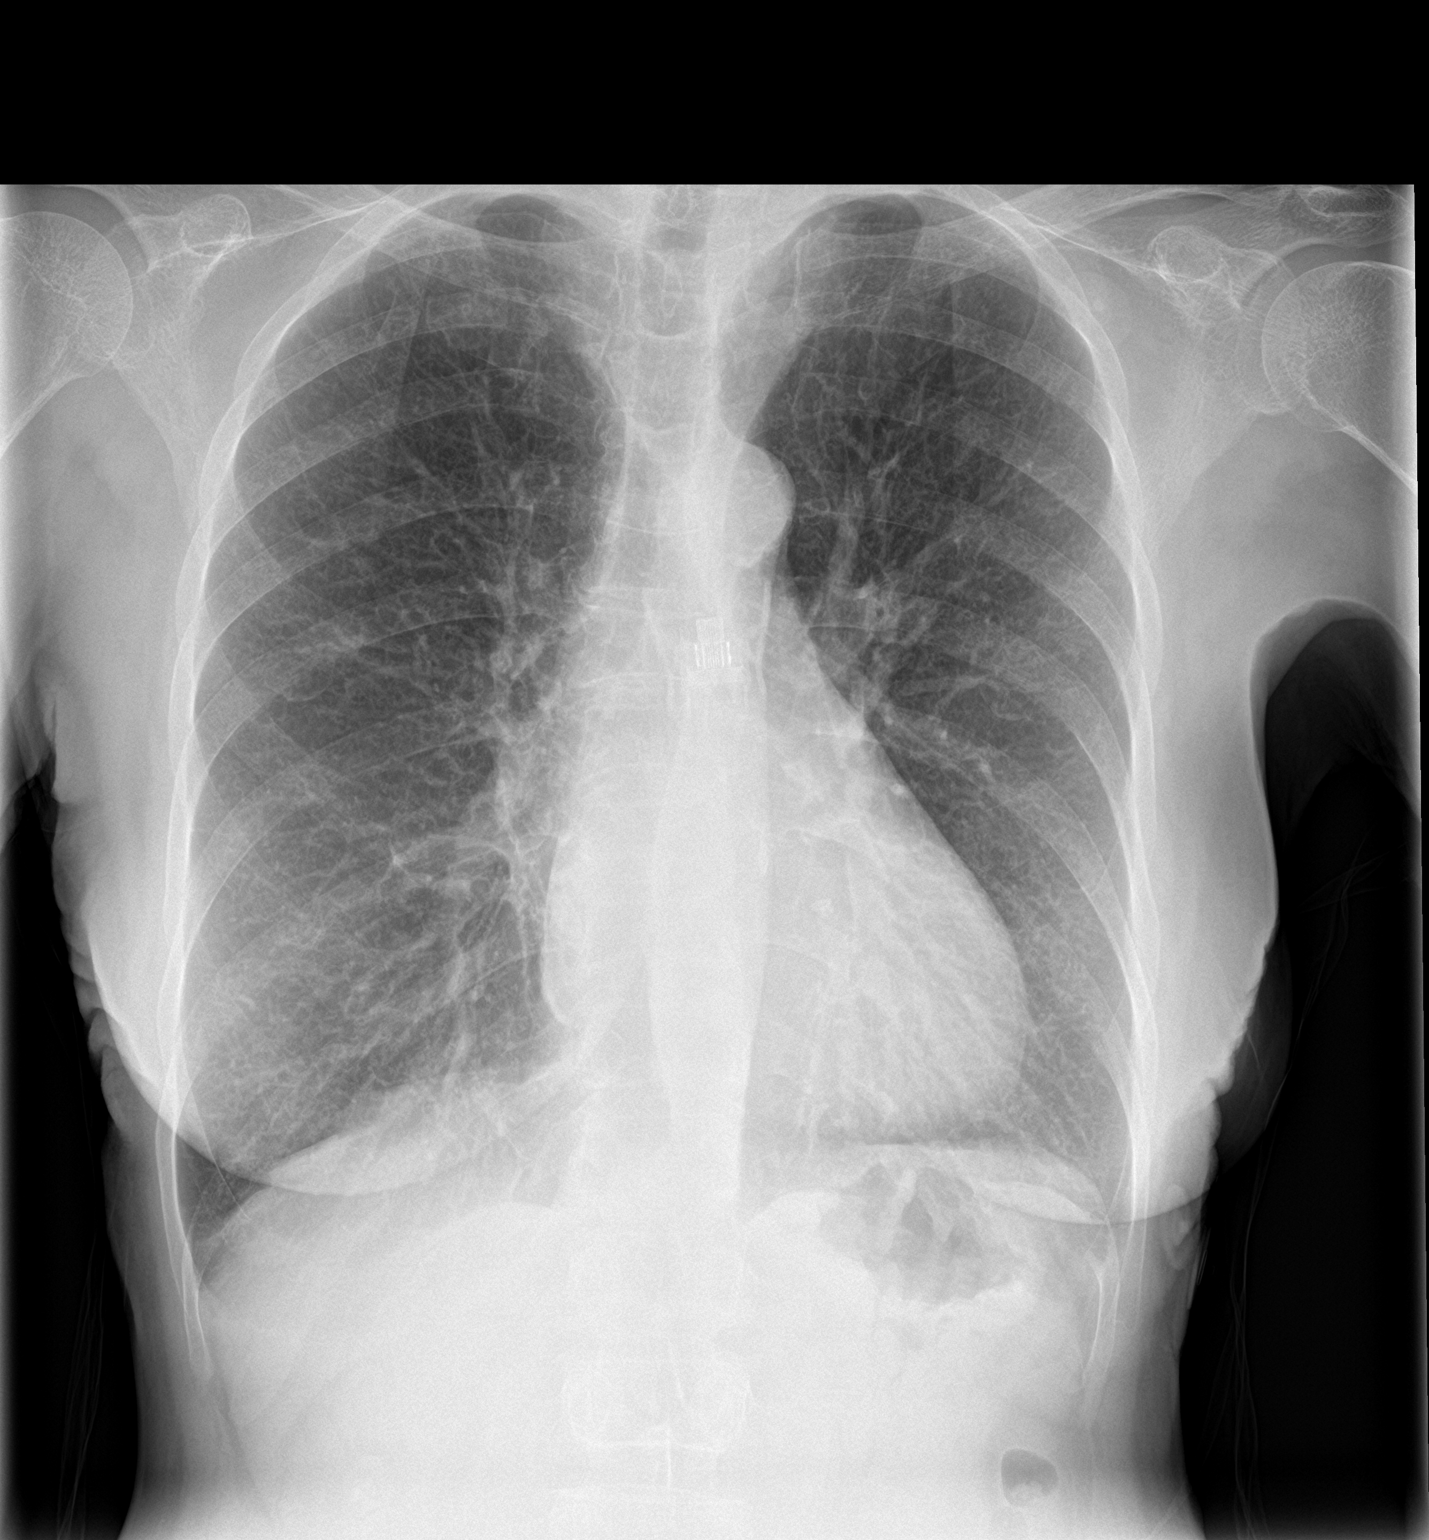

[chest lat]
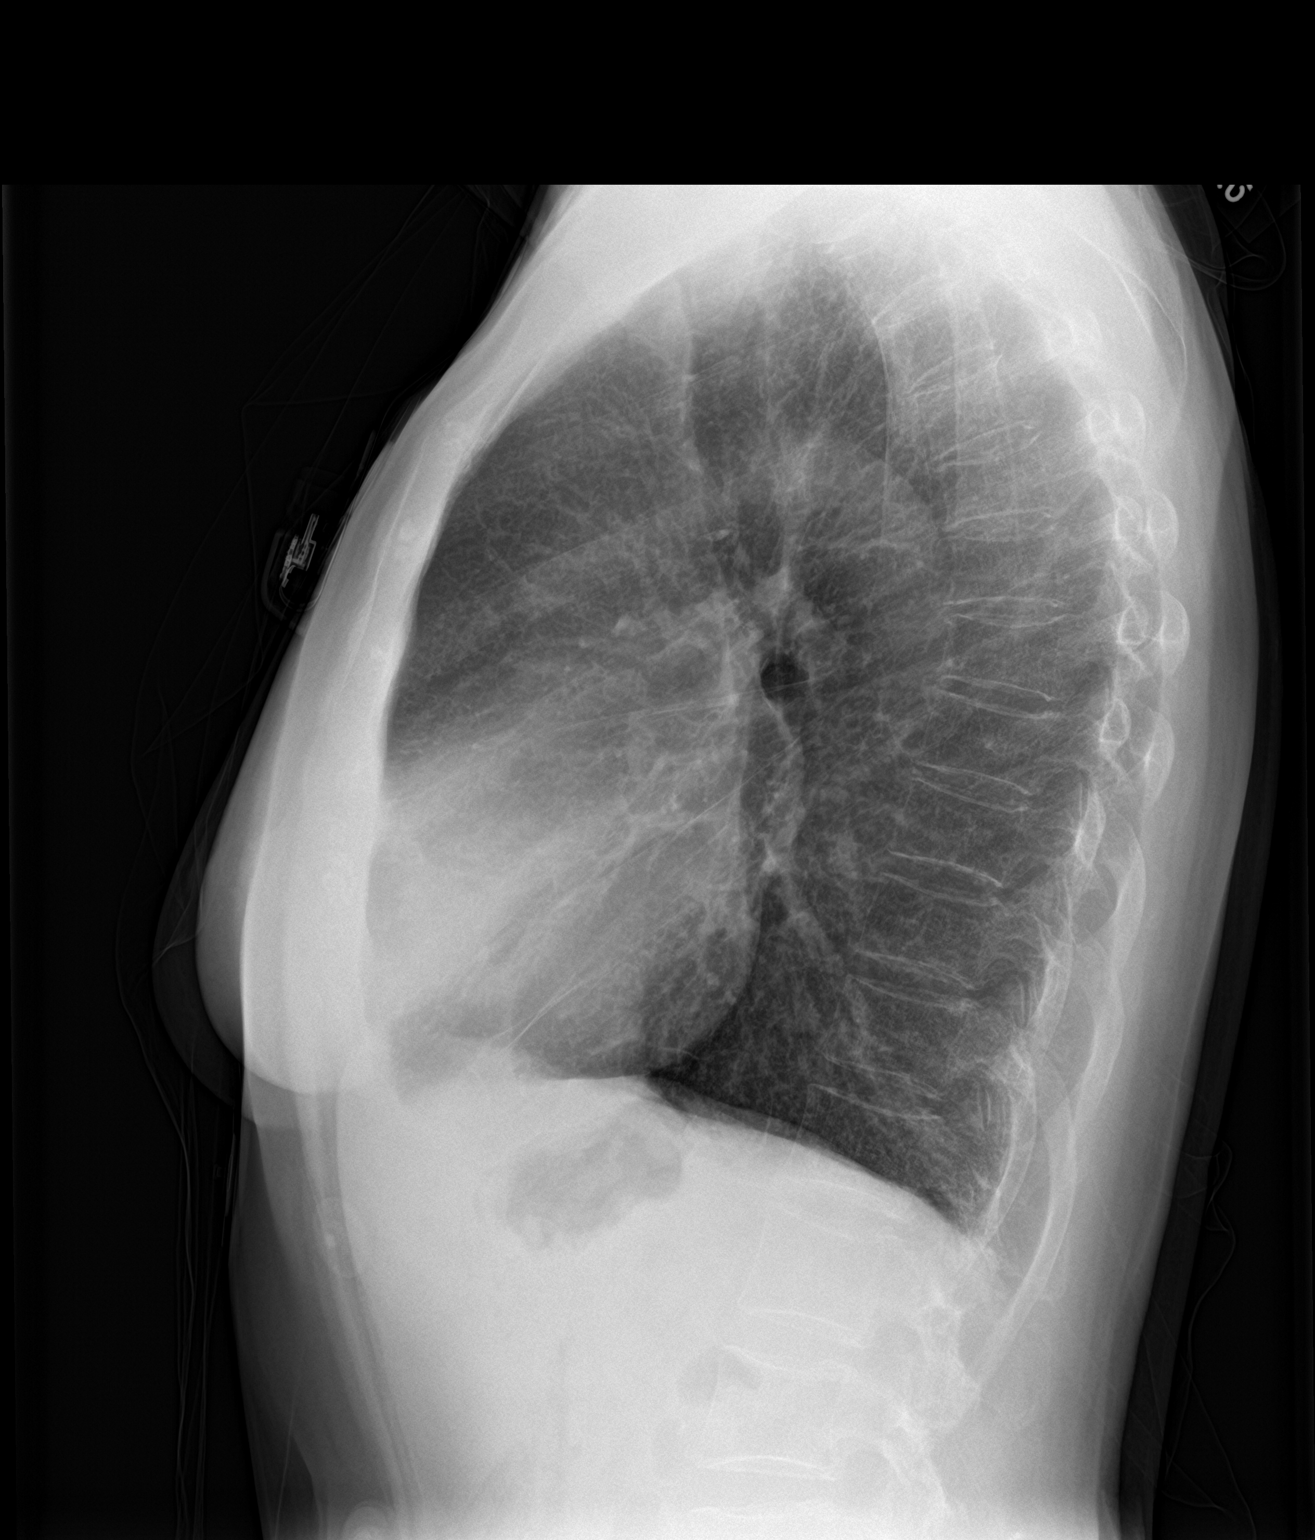

[2 of 2 positions shown; findings below may reference images not displayed]

FINDINGS: A device projects over the superior mediastinum, likely reflecting a
cardiac monitor given the provided history.

Heart size within normal limits.

There is no appreciable airspace consolidation.

No evidence of pleural effusion or pneumothorax.

No acute bony abnormality identified.
IMPRESSION: No evidence of acute cardiopulmonary abnormality.

## 2022-05-21 ENCOUNTER — Ambulatory Visit (INDEPENDENT_AMBULATORY_CARE_PROVIDER_SITE_OTHER): Payer: PPO | Admitting: Emergency Medicine

## 2022-05-21 ENCOUNTER — Encounter: Payer: Self-pay | Admitting: Emergency Medicine

## 2022-05-21 VITALS — BP 150/86 | HR 96 | Temp 97.6°F | Ht 66.0 in | Wt 103.0 lb

## 2022-05-21 DIAGNOSIS — M810 Age-related osteoporosis without current pathological fracture: Secondary | ICD-10-CM

## 2022-05-21 DIAGNOSIS — I10 Essential (primary) hypertension: Secondary | ICD-10-CM | POA: Diagnosis not present

## 2022-05-21 DIAGNOSIS — I700 Atherosclerosis of aorta: Secondary | ICD-10-CM | POA: Diagnosis not present

## 2022-05-21 DIAGNOSIS — J432 Centrilobular emphysema: Secondary | ICD-10-CM | POA: Diagnosis not present

## 2022-05-21 DIAGNOSIS — Z720 Tobacco use: Secondary | ICD-10-CM

## 2022-05-21 DIAGNOSIS — F4323 Adjustment disorder with mixed anxiety and depressed mood: Secondary | ICD-10-CM

## 2022-05-21 LAB — LIPID PANEL
Cholesterol: 175 mg/dL (ref 0–200)
HDL: 98.5 mg/dL (ref >39.00)
LDL Cholesterol: 67 mg/dL (ref 0–99)
NonHDL: 76.76
Total CHOL/HDL Ratio: 2
Triglycerides: 48 mg/dL (ref 0.0–149.0)
VLDL: 9.6 mg/dL (ref 0.0–40.0)

## 2022-05-21 LAB — CBC WITH DIFFERENTIAL/PLATELET
Basophils Absolute: 0.1 10*3/uL (ref 0.0–0.1)
Basophils Relative: 0.7 % (ref 0.0–3.0)
Eosinophils Absolute: 0.2 10*3/uL (ref 0.0–0.7)
Eosinophils Relative: 2.1 % (ref 0.0–5.0)
HCT: 40.7 % (ref 36.0–46.0)
Hemoglobin: 14 g/dL (ref 12.0–15.0)
Lymphocytes Relative: 16.5 % (ref 12.0–46.0)
Lymphs Abs: 1.2 10*3/uL (ref 0.7–4.0)
MCHC: 34.3 g/dL (ref 30.0–36.0)
MCV: 99.4 fl (ref 78.0–100.0)
Monocytes Absolute: 0.7 10*3/uL (ref 0.1–1.0)
Monocytes Relative: 9.1 % (ref 3.0–12.0)
Neutro Abs: 5.2 10*3/uL (ref 1.4–7.7)
Neutrophils Relative %: 71.6 % (ref 43.0–77.0)
Platelets: 285 10*3/uL (ref 150.0–400.0)
RBC: 4.09 Mil/uL (ref 3.87–5.11)
RDW: 13.4 % (ref 11.5–15.5)
WBC: 7.3 10*3/uL (ref 4.0–10.5)

## 2022-05-21 LAB — COMPREHENSIVE METABOLIC PANEL
ALT: 18 U/L (ref 0–35)
AST: 23 U/L (ref 0–37)
Albumin: 4.4 g/dL (ref 3.5–5.2)
Alkaline Phosphatase: 68 U/L (ref 39–117)
BUN: 12 mg/dL (ref 6–23)
CO2: 27 mEq/L (ref 19–32)
Calcium: 9.3 mg/dL (ref 8.4–10.5)
Chloride: 106 mEq/L (ref 96–112)
Creatinine, Ser: 0.65 mg/dL (ref 0.40–1.20)
GFR: 85.06 mL/min (ref >60.00)
Glucose, Bld: 95 mg/dL (ref 70–99)
Potassium: 4.1 mEq/L (ref 3.5–5.1)
Sodium: 141 mEq/L (ref 135–145)
Total Bilirubin: 0.6 mg/dL (ref 0.2–1.2)
Total Protein: 7.3 g/dL (ref 6.0–8.3)

## 2022-05-21 LAB — HEMOGLOBIN A1C: Hgb A1c MFr Bld: 5.9 % (ref 4.6–6.5)

## 2022-05-21 MED ORDER — ALBUTEROL SULFATE HFA 108 (90 BASE) MCG/ACT IN AERS: INHALATION_SPRAY | RESPIRATORY_TRACT | 0 refills | Status: DC

## 2022-05-21 MED ORDER — LOSARTAN POTASSIUM 50 MG PO TABS: 50.0000 mg | ORAL_TABLET | Freq: Every day | ORAL | 3 refills | Status: DC

## 2022-05-21 NOTE — Assessment & Plan Note (Signed)
Continues weekly Fosamax 70 mg

## 2022-05-21 NOTE — Assessment & Plan Note (Signed)
Stable and well-controlled. Has been off Lexapro for several weeks.

## 2022-05-21 NOTE — Assessment & Plan Note (Signed)
Elevated blood pressure reading in the office. Recommend to increase dose of losartan to 50 mg daily. Has family history of heart disease. Requesting cardiology referral Has some dyspnea on exertion

## 2022-05-21 NOTE — Patient Instructions (Signed)
Hypertension, Adult High blood pressure (hypertension) is when the force of blood pumping through the arteries is too strong. The arteries are the blood vessels that carry blood from the heart throughout the body. Hypertension forces the heart to work harder to pump blood and may cause arteries to become narrow or stiff. Untreated or uncontrolled hypertension can lead to a heart attack, heart failure, a stroke, kidney disease, and other problems. A blood pressure reading consists of a higher number over a lower number. Ideally, your blood pressure should be below 120/80. The first ("top") number is called the systolic pressure. It is a measure of the pressure in your arteries as your heart beats. The second ("bottom") number is called the diastolic pressure. It is a measure of the pressure in your arteries as the heart relaxes. What are the causes? The exact cause of this condition is not known. There are some conditions that result in high blood pressure. What increases the risk? Certain factors may make you more likely to develop high blood pressure. Some of these risk factors are under your control, including: Smoking. Not getting enough exercise or physical activity. Being overweight. Having too much fat, sugar, calories, or salt (sodium) in your diet. Drinking too much alcohol. Other risk factors include: Having a personal history of heart disease, diabetes, high cholesterol, or kidney disease. Stress. Having a family history of high blood pressure and high cholesterol. Having obstructive sleep apnea. Age. The risk increases with age. What are the signs or symptoms? High blood pressure may not cause symptoms. Very high blood pressure (hypertensive crisis) may cause: Headache. Fast or irregular heartbeats (palpitations). Shortness of breath. Nosebleed. Nausea and vomiting. Vision changes. Severe chest pain, dizziness, and seizures. How is this diagnosed? This condition is diagnosed by  measuring your blood pressure while you are seated, with your arm resting on a flat surface, your legs uncrossed, and your feet flat on the floor. The cuff of the blood pressure monitor will be placed directly against the skin of your upper arm at the level of your heart. Blood pressure should be measured at least twice using the same arm. Certain conditions can cause a difference in blood pressure between your right and left arms. If you have a high blood pressure reading during one visit or you have normal blood pressure with other risk factors, you may be asked to: Return on a different day to have your blood pressure checked again. Monitor your blood pressure at home for 1 week or longer. If you are diagnosed with hypertension, you may have other blood or imaging tests to help your health care provider understand your overall risk for other conditions. How is this treated? This condition is treated by making healthy lifestyle changes, such as eating healthy foods, exercising more, and reducing your alcohol intake. You may be referred for counseling on a healthy diet and physical activity. Your health care provider may prescribe medicine if lifestyle changes are not enough to get your blood pressure under control and if: Your systolic blood pressure is above 130. Your diastolic blood pressure is above 80. Your personal target blood pressure may vary depending on your medical conditions, your age, and other factors. Follow these instructions at home: Eating and drinking  Eat a diet that is high in fiber and potassium, and low in sodium, added sugar, and fat. An example of this eating plan is called the DASH diet. DASH stands for Dietary Approaches to Stop Hypertension. To eat this way: Eat   plenty of fresh fruits and vegetables. Try to fill one half of your plate at each meal with fruits and vegetables. Eat whole grains, such as whole-wheat pasta, brown rice, or whole-grain bread. Fill about one  fourth of your plate with whole grains. Eat or drink low-fat dairy products, such as skim milk or low-fat yogurt. Avoid fatty cuts of meat, processed or cured meats, and poultry with skin. Fill about one fourth of your plate with lean proteins, such as fish, chicken without skin, beans, eggs, or tofu. Avoid pre-made and processed foods. These tend to be higher in sodium, added sugar, and fat. Reduce your daily sodium intake. Many people with hypertension should eat less than 1,500 mg of sodium a day. Do not drink alcohol if: Your health care provider tells you not to drink. You are pregnant, may be pregnant, or are planning to become pregnant. If you drink alcohol: Limit how much you have to: 0-1 drink a day for women. 0-2 drinks a day for men. Know how much alcohol is in your drink. In the [place] one drink equals one 12 oz bottle of beer (355 mL), one 5 oz glass of wine (148 mL), or one 1 oz glass of hard liquor (44 mL). Lifestyle  Work with your health care provider to maintain a healthy body weight or to lose weight. Ask what an ideal weight is for you. Get at least 30 minutes of exercise that causes your heart to beat faster (aerobic exercise) most days of the week. Activities may include walking, swimming, or biking. Include exercise to strengthen your muscles (resistance exercise), such as Pilates or lifting weights, as part of your weekly exercise routine. Try to do these types of exercises for 30 minutes at least 3 days a week. Do not use any products that contain nicotine or tobacco. These products include cigarettes, chewing tobacco, and vaping devices, such as e-cigarettes. If you need help quitting, ask your health care provider. Monitor your blood pressure at home as told by your health care provider. Keep all follow-up visits. This is important. Medicines Take over-the-counter and prescription medicines only as told by your health care provider. Follow directions carefully. Blood  pressure medicines must be taken as prescribed. Do not skip doses of blood pressure medicine. Doing this puts you at risk for problems and can make the medicine less effective. Ask your health care provider about side effects or reactions to medicines that you should watch for. Contact a health care provider if you: Think you are having a reaction to a medicine you are taking. Have headaches that keep coming back (recurring). Feel dizzy. Have swelling in your ankles. Have trouble with your vision. Get help right away if you: Develop a severe headache or confusion. Have unusual weakness or numbness. Feel faint. Have severe pain in your chest or abdomen. Vomit repeatedly. Have trouble breathing. These symptoms may be an emergency. Get help right away. Call 911. Do not wait to see if the symptoms will go away. Do not drive yourself to the hospital. Summary Hypertension is when the force of blood pumping through your arteries is too strong. If this condition is not controlled, it may put you at risk for serious complications. Your personal target blood pressure may vary depending on your medical conditions, your age, and other factors. For most people, a normal blood pressure is less than 120/80. Hypertension is treated with lifestyle changes, medicines, or a combination of both. Lifestyle changes include losing weight, eating a healthy,   low-sodium diet, exercising more, and limiting alcohol. This information is not intended to replace advice given to you by your health care provider. Make sure you discuss any questions you have with your health care provider. Document Revised: 02/26/2021 Document Reviewed: 02/26/2021 Elsevier Patient Education  2023 Elsevier Inc[employer]  

## 2022-05-21 NOTE — Progress Notes (Signed)
Terry Armstrong 77 y.o.   Chief Complaint  Patient presents with   Follow-up    84mth f/u appt,  patient has questions about some of her medications    HISTORY OF PRESENT ILLNESS: This is a 77y.o. female here for 385-monthollow-up of chronic medical conditions Has question about albuterol.  Needs refill. No other complaints or medical concerns today.  HPI   Prior to Admission medications   Medication Sig Start Date End Date Taking? Authorizing Provider  alendronate (FOSAMAX) 70 MG tablet Take one tablet once weekly. Take with a full glass of water on an empty stomach. 06/19/20  Yes Ngetich, Dinah C, NP  aspirin EC 81 MG tablet Take 1 tablet (81 mg total) by mouth daily. 06/28/19  Yes StDelia Chimes, MD  atorvastatin (LIPITOR) 40 MG tablet Take 1 tablet (40 mg total) by mouth daily. 02/18/22  Yes Jenasia Dolinar, MiInes BloomerMD  fluticasone ([facility] MCG/ACT nasal spray Place 1 spray into both nostrils daily. 01/16/22  Yes [place], HaHildred Alamin, FNP  Fluticasone-Umeclidin-Vilant (TRELEGY ELLIPTA) 100-62.5-25 MCG/ACT AEPB Inhale 1 puff into the lungs daily. 01/21/22  Yes Shi Blankenship, MiInes BloomerMD  losartan (COZAAR) 25 MG tablet Take 1 tablet (25 mg total) by mouth daily. 02/18/22  Yes Uniqua Kihn, MiInes BloomerMD  metoprolol succinate (TOPROL-XL) 25 MG 24 hr tablet Take 1 tablet (25 mg total) by mouth daily. 02/18/22 02/13/23 Yes Tammy Wickliffe, MiInes BloomerMD  multivitamin-iron-minerals-folic acid (CENTRUM) chewable tablet Chew 1 tablet by mouth daily.   Yes [provider]  Omega 3 1000 MG CAPS Take by mouth daily.   Yes [provider]  albuterol (VENTOLIN HFA) 108 (90 Base) MCG/ACT inhaler INHALE 2 PUFFS INTO THE LUNGS  EVERY 6 HOURS AS NEEDED FOR WHEEZING FOR SHORTNESS OF BREATH 05/21/22   SaHorald PollenMD  escitalopram (LEXAPRO) 10 MG tablet Take 1 tablet (10 mg total) by mouth daily. Patient not taking: Reported on 05/21/2022 02/18/22 02/13/23  SaHorald PollenMD     No Known Allergies  Patient Active Problem List   Diagnosis Date Noted   Situational mixed anxiety and depressive disorder 01/21/2022   Aortic atherosclerosis ([facility]02/15/2022   Hx of adenomatous colonic polyps 06/19/2017   Multinodular goiter 03/15/2017   Centrilobular emphysema ([facility]07/12/2016   Periodic limb movement sleep disorder 01/11/2015   Abnormal TSH 01/11/2015   Upper airway resistance syndrome 01/11/2015   Mild obstructive sleep apnea 01/11/2015   Osteoporosis 01/10/2015   Non-restorative sleep 05/23/2013   Primary snoring    HTN (hypertension) 05/11/2013   DM (diabetes mellitus) ([facility]01/30/2013   Tobacco user 06/04/2011   Hyperlipidemia LDL goal <70 06/04/2011    Past Medical History:  Diagnosis Date   Allergy    Breast abscess    Constipation    if drinks alot of water no issues - uses womens stool softener PRN only    Diabetes mellitus without complication ([place]   pt has no meds- diet controlled only    Exertional dyspnea 08/18/2019   H/O mammogram 05/06/2019   [facility]   Hx of adenomatous colonic polyps 06/19/2017   Hypercholesteremia    [facility]   Hyperlipidemia    Hypertension    Per [facility] patient packet   Palpitations 08/18/2019   Primary snoring    Thyroid condition    [place] [facility] patient [facility]   VT (ventricular tachycardia) (HCCanyon5/01/2020    Past Surgical History:  Procedure Laterality Date   ABDOMINAL HYSTERECTOMY  1993   COLONOSCOPY     POLYPECTOMY      Social History   Socioeconomic History   Marital status: Married    Spouse name: Not on file   Number of children: 0   Years of education: 14   Highest education level: Not on file  Occupational History    Employer: RETIRED  Tobacco Use   Smoking status: Every Day    Packs/day: 1.50    Years: 49.00    Total pack years: 73.50    Types: Cigarettes   Smokeless tobacco: Never   Tobacco comments:    1/2 ppd 01/16/20  Vaping Use    Vaping Use: Never used  Substance and Sexual Activity   Alcohol use: Yes    Comment: 2-4 per week   Drug use: No   Sexual activity: Not Currently  Other Topics Concern   Not on file  Social History Narrative   Diet       Do you drink/eat things with caffeine Coffee      Marital Status Married What year were you married? 2015      Do you live in a house, apartment, assisted living, condo, trailer, etc.? [place]      Is it one or more stories? Yes 3 stories      How many persons live in your home? 2        Do you have any pets in your home?(please list) No      Highest level of education completed: 2 years college      Current or past profession: [employer]      Do you exercise?: Yes    Type and how often: Daily      Do you have a Living Will? (Form that indicates scenarios where you would not want your life prolonged) No      Do you have a DNR form?         If not, would you like to discuss one? No      Do you have signed POA/HPOA forms? No      Do you have difficulty bathing or dressing yourself? No      Do you have difficulty preparing food or eating? No      Do you have difficulty managing medications? No      Do you have difficulty managing your finances? No      Do you have difficulty affording your medications? No                     Social Determinants of Health   Financial Resource Strain: Low Risk  (02/17/2022)   Overall Financial Resource Strain (CARDIA)    Difficulty of Paying Living Expenses: Not hard at all  Food Insecurity: No Food Insecurity (02/17/2022)   Hunger Vital Sign    Worried About Running Out of Food in the Last Year: Never true    Ran Out of Food in the Last Year: Never true  Transportation Needs: No Transportation Needs (02/17/2022)   PRAPARE - Hydrologist (Medical): No    Lack of Transportation (Non-Medical): No  Physical Activity: Insufficiently Active (02/17/2022)   Exercise Vital Sign     Days of Exercise per Week: 3 days    Minutes of Exercise per Session: 30 min  Stress: No Stress Concern Present (02/17/2022)   [place]  Feeling of Stress : Not at all  Social Connections: Moderately Isolated (02/17/2022)   Social Connection and Isolation Panel [NHANES]    Frequency of Communication with Friends and Family: More than three times a week    Frequency of Social Gatherings with Friends and Family: More than three times a week    Attends Religious Services: More than 4 times per year    Active Member of [employer] or Organizations: No    Attends Archivist Meetings: Never    Marital Status: Widowed  Intimate Partner Violence: Not At Risk (02/17/2022)   Humiliation, Afraid, Rape, and Kick questionnaire    Fear of Current or Ex-Partner: No    Emotionally Abused: No    Physically Abused: No    Sexually Abused: No    Family History  Problem Relation Age of Onset   Diabetes Mother    Diabetes Father    Diabetes Other    Hypertension Sister    Hyperlipidemia Other    Heart disease Sister        defibrillator   Kidney failure Maternal Aunt        dialysis   Diabetes Maternal Aunt    Hypertension Maternal Aunt    Diabetes Maternal Aunt    Heart Problems Maternal Grandmother    Cancer Maternal Grandmother    Heart disease Maternal Grandmother    Hypertension Maternal Grandmother    Diabetes Brother    Colon cancer Neg Hx    Colon polyps Neg Hx    Rectal cancer Neg Hx    Stomach cancer Neg Hx      Review of Systems  Constitutional: Negative.  Negative for chills and fever.  HENT: Negative.  Negative for congestion and sore throat.   Respiratory: Negative.  Negative for cough and shortness of breath.   Cardiovascular: Negative.  Negative for chest pain and palpitations.  Gastrointestinal:  Negative for abdominal pain, diarrhea, nausea and vomiting.  Genitourinary: Negative.  Negative  for dysuria and hematuria.  Skin: Negative.  Negative for rash.  Neurological: Negative.  Negative for dizziness and headaches.  All other systems reviewed and are negative.   Today's Vitals   05/21/22 0840  BP: (!) 152/96  Pulse: 96  Temp: 97.6 F (36.4 C)  TempSrc: Oral  SpO2: 93%  Weight: 103 lb (46.7 kg)  Height: '5\' 6"'$  (1.676 m)   Body mass index is 16.62 kg/m.  Physical Exam Vitals reviewed.  Constitutional:      Appearance: Normal appearance.  HENT:     Head: Normocephalic.     Mouth/Throat:     Mouth: Mucous membranes are moist.     Pharynx: Oropharynx is clear.  Eyes:     Extraocular Movements: Extraocular movements intact.     Pupils: Pupils are equal, round, and reactive to light.  Cardiovascular:     Rate and Rhythm: Normal rate and regular rhythm.     Pulses: Normal pulses.     Heart sounds: Normal heart sounds.  Pulmonary:     Effort: Pulmonary effort is normal.     Breath sounds: Normal breath sounds.  Musculoskeletal:     Cervical back: No tenderness.     Right lower leg: No edema.     Left lower leg: No edema.  Lymphadenopathy:     Cervical: No cervical adenopathy.  Skin:    General: Skin is warm and dry.  Neurological:     General: No focal deficit present.     Mental Status:  She is alert and oriented to person, place, and time.  Psychiatric:        Mood and Affect: Mood normal.        Behavior: Behavior normal.      ASSESSMENT & PLAN: A total of 46 minutes was spent with the patient and counseling/coordination of care regarding preparing for this visit, review of most recent office visit notes, review of multiple chronic medical conditions under management, review of most recent blood work results, review of all medications, cardiovascular risks associated with hypertension and smoking, education on nutrition, prognosis, documentation, and need for follow-up.  Problem List Items Addressed This Visit       Cardiovascular and Mediastinum    HTN (hypertension) - Primary    Elevated blood pressure reading in the office. Recommend to increase dose of losartan to 50 mg daily. Has family history of heart disease. Requesting cardiology referral Has some dyspnea on exertion      Relevant Medications   losartan (COZAAR) 50 MG tablet   Other Relevant Orders   Ambulatory referral to Cardiology   Comprehensive metabolic panel   CBC with Differential/Platelet   Lipid panel   Hemoglobin A1c   Aortic atherosclerosis (HCC)   Relevant Medications   losartan (COZAAR) 50 MG tablet   Other Relevant Orders   Ambulatory referral to Cardiology   Lipid panel   Hemoglobin A1c     Respiratory   Centrilobular emphysema (HCC)    Stable Continues daily Trelegy and albuterol as rescue inhaler      Relevant Medications   albuterol (VENTOLIN HFA) 108 (90 Base) MCG/ACT inhaler     Musculoskeletal and Integument   Osteoporosis    Continues weekly Fosamax 70 mg        Other   Tobacco user    Continues to smoke. Cardiovascular and cancer risk associated with smoking discussed Smoking cessation of given.      Situational mixed anxiety and depressive disorder    Stable and well-controlled. Has been off Lexapro for several weeks.      Patient Instructions  Hypertension, Adult High blood pressure (hypertension) is when the force of blood pumping through the arteries is too strong. The arteries are the blood vessels that carry blood from the heart throughout the body. Hypertension forces the heart to work harder to pump blood and may cause arteries to become narrow or stiff. Untreated or uncontrolled hypertension can lead to a heart attack, heart failure, a stroke, kidney disease, and other problems. A blood pressure reading consists of a higher number over a lower number. Ideally, your blood pressure should be below 120/80. The first ("top") number is called the systolic pressure. It is a measure of the pressure in your arteries as  your heart beats. The second ("bottom") number is called the diastolic pressure. It is a measure of the pressure in your arteries as the heart relaxes. What are the causes? The exact cause of this condition is not known. There are some conditions that result in high blood pressure. What increases the risk? Certain factors may make you more likely to develop high blood pressure. Some of these risk factors are under your control, including: Smoking. Not getting enough exercise or physical activity. Being overweight. Having too much fat, sugar, calories, or salt (sodium) in your diet. Drinking too much alcohol. Other risk factors include: Having a personal history of heart disease, diabetes, high cholesterol, or kidney disease. Stress. Having a family history of high blood pressure and high  cholesterol. Having obstructive sleep apnea. Age. The risk increases with age. What are the signs or symptoms? High blood pressure may not cause symptoms. Very high blood pressure (hypertensive crisis) may cause: Headache. Fast or irregular heartbeats (palpitations). Shortness of breath. Nosebleed. Nausea and vomiting. Vision changes. Severe chest pain, dizziness, and seizures. How is this diagnosed? This condition is diagnosed by measuring your blood pressure while you are seated, with your arm resting on a flat surface, your legs uncrossed, and your feet flat on the floor. The cuff of the blood pressure monitor will be placed directly against the skin of your upper arm at the level of your heart. Blood pressure should be measured at least twice using the same arm. Certain conditions can cause a difference in blood pressure between your right and left arms. If you have a high blood pressure reading during one visit or you have normal blood pressure with other risk factors, you may be asked to: Return on a different day to have your blood pressure checked again. Monitor your blood pressure at home for 1  week or longer. If you are diagnosed with hypertension, you may have other blood or imaging tests to help your health care provider understand your overall risk for other conditions. How is this treated? This condition is treated by making healthy lifestyle changes, such as eating healthy foods, exercising more, and reducing your alcohol intake. You may be referred for counseling on a healthy diet and physical activity. Your health care provider may prescribe medicine if lifestyle changes are not enough to get your blood pressure under control and if: Your systolic blood pressure is above 130. Your diastolic blood pressure is above 80. Your personal target blood pressure may vary depending on your medical conditions, your age, and other factors. Follow these instructions at home: Eating and drinking  Eat a diet that is high in fiber and potassium, and low in sodium, added sugar, and fat. An example of this eating plan is called the DASH diet. DASH stands for Dietary Approaches to Stop Hypertension. To eat this way: Eat plenty of fresh fruits and vegetables. Try to fill one half of your plate at each meal with fruits and vegetables. Eat whole grains, such as whole-wheat pasta, brown rice, or whole-grain bread. Fill about one fourth of your plate with whole grains. Eat or drink low-fat dairy products, such as skim milk or low-fat yogurt. Avoid fatty cuts of meat, processed or cured meats, and poultry with skin. Fill about one fourth of your plate with lean proteins, such as fish, chicken without skin, beans, eggs, or tofu. Avoid pre-made and processed foods. These tend to be higher in sodium, added sugar, and fat. Reduce your daily sodium intake. Many people with hypertension should eat less than 1,500 mg of sodium a day. Do not drink alcohol if: Your health care provider tells you not to drink. You are pregnant, may be pregnant, or are planning to become pregnant. If you drink alcohol: Limit how  much you have to: 0-1 drink a day for women. 0-2 drinks a day for men. Know how much alcohol is in your drink. In the [place] one drink equals one 12 oz bottle of beer (355 mL), one 5 oz glass of wine (148 mL), or one 1 oz glass of hard liquor (44 mL). Lifestyle  Work with your health care provider to maintain a healthy body weight or to lose weight. Ask what an ideal weight is for you. Get at least  30 minutes of exercise that causes your heart to beat faster (aerobic exercise) most days of the week. Activities may include walking, swimming, or biking. Include exercise to strengthen your muscles (resistance exercise), such as Pilates or lifting weights, as part of your weekly exercise routine. Try to do these types of exercises for 30 minutes at least 3 days a week. Do not use any products that contain nicotine or tobacco. These products include cigarettes, chewing tobacco, and vaping devices, such as e-cigarettes. If you need help quitting, ask your health care provider. Monitor your blood pressure at home as told by your health care provider. Keep all follow-up visits. This is important. Medicines Take over-the-counter and prescription medicines only as told by your health care provider. Follow directions carefully. Blood pressure medicines must be taken as prescribed. Do not skip doses of blood pressure medicine. Doing this puts you at risk for problems and can make the medicine less effective. Ask your health care provider about side effects or reactions to medicines that you should watch for. Contact a health care provider if you: Think you are having a reaction to a medicine you are taking. Have headaches that keep coming back (recurring). Feel dizzy. Have swelling in your ankles. Have trouble with your vision. Get help right away if you: Develop a severe headache or confusion. Have unusual weakness or numbness. Feel faint. Have severe pain in your chest or abdomen. Vomit  repeatedly. Have trouble breathing. These symptoms may be an emergency. Get help right away. Call 911. Do not wait to see if the symptoms will go away. Do not drive yourself to the hospital. Summary Hypertension is when the force of blood pumping through your arteries is too strong. If this condition is not controlled, it may put you at risk for serious complications. Your personal target blood pressure may vary depending on your medical conditions, your age, and other factors. For most people, a normal blood pressure is less than 120/80. Hypertension is treated with lifestyle changes, medicines, or a combination of both. Lifestyle changes include losing weight, eating a healthy, low-sodium diet, exercising more, and limiting alcohol. This information is not intended to replace advice given to you by your health care provider. Make sure you discuss any questions you have with your health care provider. Document Revised: 02/26/2021 Document Reviewed: 02/26/2021 Elsevier Patient Education  [place], MD [facility]

## 2022-05-21 NOTE — Assessment & Plan Note (Signed)
Continues to smoke. Cardiovascular and cancer risk associated with smoking discussed Smoking cessation of given.

## 2022-05-21 NOTE — Assessment & Plan Note (Signed)
Stable Continues daily Trelegy and albuterol as rescue inhaler

## 2022-05-27 ENCOUNTER — Ambulatory Visit (INDEPENDENT_AMBULATORY_CARE_PROVIDER_SITE_OTHER): Payer: PPO | Admitting: Emergency Medicine

## 2022-05-27 ENCOUNTER — Encounter (HOSPITAL_BASED_OUTPATIENT_CLINIC_OR_DEPARTMENT_OTHER): Payer: Self-pay | Admitting: Family

## 2022-05-27 ENCOUNTER — Encounter (HOSPITAL_BASED_OUTPATIENT_CLINIC_OR_DEPARTMENT_OTHER): Payer: Self-pay | Admitting: Cardiovascular Disease

## 2022-05-27 ENCOUNTER — Encounter: Payer: Self-pay | Admitting: Emergency Medicine

## 2022-05-27 ENCOUNTER — Ambulatory Visit (HOSPITAL_BASED_OUTPATIENT_CLINIC_OR_DEPARTMENT_OTHER): Payer: PPO | Admitting: Family

## 2022-05-27 VITALS — BP 130/86 | Ht 63.0 in | Wt 102.8 lb

## 2022-05-27 VITALS — BP 132/82 | HR 56 | Temp 98.4°F | Ht 63.0 in | Wt 102.0 lb

## 2022-05-27 DIAGNOSIS — E785 Hyperlipidemia, unspecified: Secondary | ICD-10-CM | POA: Diagnosis not present

## 2022-05-27 DIAGNOSIS — R0609 Other forms of dyspnea: Secondary | ICD-10-CM | POA: Diagnosis not present

## 2022-05-27 DIAGNOSIS — Z720 Tobacco use: Secondary | ICD-10-CM | POA: Diagnosis not present

## 2022-05-27 DIAGNOSIS — I2510 Atherosclerotic heart disease of native coronary artery without angina pectoris: Secondary | ICD-10-CM | POA: Diagnosis not present

## 2022-05-27 DIAGNOSIS — R300 Dysuria: Secondary | ICD-10-CM | POA: Diagnosis not present

## 2022-05-27 DIAGNOSIS — I471 Supraventricular tachycardia, unspecified: Secondary | ICD-10-CM

## 2022-05-27 DIAGNOSIS — N390 Urinary tract infection, site not specified: Secondary | ICD-10-CM | POA: Diagnosis not present

## 2022-05-27 DIAGNOSIS — I493 Ventricular premature depolarization: Secondary | ICD-10-CM

## 2022-05-27 LAB — POCT URINALYSIS DIPSTICK
Bilirubin, UA: NEGATIVE
Glucose, UA: NEGATIVE
Ketones, UA: NEGATIVE
Nitrite, UA: NEGATIVE
Protein, UA: POSITIVE — AB
Spec Grav, UA: 1.015 (ref 1.010–1.025)
Urobilinogen, UA: 0.2 E.U./dL — AB
pH, UA: 7 (ref 5.0–8.0)

## 2022-05-27 MED ORDER — CEFUROXIME AXETIL 250 MG PO TABS: 250.0000 mg | ORAL_TABLET | Freq: Two times a day (BID) | ORAL | 0 refills | Status: AC

## 2022-05-27 NOTE — Progress Notes (Signed)
Terry Armstrong 77 y.o.   Chief Complaint  Patient presents with   Acute Visit    Poss UTI, urine odor, urine urgency, burning     HISTORY OF PRESENT ILLNESS: Acute problem visit today. This is a 77 y.o. female complaining of burning on urination with bad smell that started 3 days ago. Able to eat and drink.  Denies nausea or vomiting.  Denies fever or chills. Denies abdominal or flank pain.  Denies gross hematuria. No other associated symptoms. No other complaints or medical concerns today.  HPI   Prior to Admission medications   Medication Sig Start Date End Date Taking? Authorizing Provider  albuterol (VENTOLIN HFA) 108 (90 Base) MCG/ACT inhaler INHALE 2 PUFFS INTO THE LUNGS  EVERY 6 HOURS AS NEEDED FOR WHEEZING FOR SHORTNESS OF BREATH 05/21/22  Yes Shelley Cocke, Ines Bloomer, MD  alendronate (FOSAMAX) 70 MG tablet Take one tablet once weekly. Take with a full glass of water on an empty stomach. 06/19/20  Yes Ngetich, Dinah C, NP  aspirin EC 81 MG tablet Take 1 tablet (81 mg total) by mouth daily. 06/28/19  Yes Delia Chimes A, MD  atorvastatin (LIPITOR) 40 MG tablet Take 1 tablet (40 mg total) by mouth daily. 02/18/22  Yes Burney Calzadilla, Ines Bloomer, MD  cefUROXime (CEFTIN) 250 MG tablet Take 1 tablet (250 mg total) by mouth 2 (two) times daily with a meal for 7 days. 05/27/22 06/03/22 Yes Ketan Renz, Ines Bloomer, MD  escitalopram (LEXAPRO) 10 MG tablet Take 1 tablet (10 mg total) by mouth daily. 02/18/22 02/13/23 Yes Lorel Lembo, Ines Bloomer, MD  fluticasone [facility]) 50 MCG/ACT nasal spray Place 1 spray into both nostrils daily. 01/16/22  Yes [place], Hildred Alamin E, FNP  Fluticasone-Umeclidin-Vilant (TRELEGY ELLIPTA) 100-62.5-25 MCG/ACT AEPB Inhale 1 puff into the lungs daily. 01/21/22  Yes Isaac Dubie, Ines Bloomer, MD  losartan (COZAAR) 50 MG tablet Take 1 tablet (50 mg total) by mouth daily. 05/21/22  Yes Maverick Dieudonne, Ines Bloomer, MD  metoprolol succinate (TOPROL-XL) 25 MG 24 hr tablet Take 1 tablet (25 mg total)  by mouth daily. 02/18/22 02/13/23 Yes Jarryd Gratz, Ines Bloomer, MD  multivitamin-iron-minerals-folic acid (CENTRUM) chewable tablet Chew 1 tablet by mouth daily.   Yes [provider]  Omega 3 1000 MG CAPS Take by mouth daily.   Yes [provider]    No Known Allergies  Patient Active Problem List   Diagnosis Date Noted   Situational mixed anxiety and depressive disorder 01/21/2022   Aortic atherosclerosis ([place]) 06/19/2020   Hx of adenomatous colonic polyps 06/19/2017   Multinodular goiter 03/15/2017   Centrilobular emphysema ([place]) 11/09/2016   Periodic limb movement sleep disorder 01/11/2015   Abnormal TSH 01/11/2015   Upper airway resistance syndrome 01/11/2015   Mild obstructive sleep apnea 01/11/2015   Osteoporosis 01/10/2015   Non-restorative sleep 05/23/2013   Primary snoring    HTN (hypertension) 05/11/2013   DM (diabetes mellitus) ([place]) 06/04/2011   Tobacco user 06/04/2011   Hyperlipidemia LDL goal <70 06/04/2011    Past Medical History:  Diagnosis Date   Allergy    Breast abscess    Constipation    if drinks alot of water no issues - uses womens stool softener PRN only    Diabetes mellitus without complication ([place])    pt has no meds- diet controlled only    Exertional dyspnea 08/18/2019   H/O mammogram 05/06/2019   Per [place] new patient packet   Hx of adenomatous colonic polyps 06/19/2017   Hypercholesteremia    Per [place]  new patient packet   Hyperlipidemia    Hypertension    Per [place] new patient packet   Palpitations 08/18/2019   Primary snoring    Thyroid condition    Per [facility] new patient packet   VT (ventricular tachycardia) ([place]) 09/11/2019    Past Surgical History:  Procedure Laterality Date   ABDOMINAL HYSTERECTOMY  1993   COLONOSCOPY     POLYPECTOMY      Social History   Socioeconomic History   Marital status: Married    Spouse name: Not on file   Number of children: 0   Years of education: 14   Highest education level: Not on  file  Occupational History    Employer: RETIRED  Tobacco Use   Smoking status: Every Day    Packs/day: 1.50    Years: 49.00    Total pack years: 73.50    Types: Cigarettes   Smokeless tobacco: Never   Tobacco comments:    1/2 ppd 01/16/20  Vaping Use   Vaping Use: Never used  Substance and Sexual Activity   Alcohol use: Yes    Comment: 2-4 per week   Drug use: No   Sexual activity: Not Currently  Other Topics Concern   Not on file  Social History Narrative   Diet       Do you drink/eat things with caffeine Coffee      Marital Status Married What year were you married? 2015      Do you live in a house, apartment, assisted living, condo, trailer, etc.? [place]      Is it one or more stories? Yes 3 stories      How many persons live in your home? 2        Do you have any pets in your home?(please list) No      Highest level of education completed: 2 years college      Current or past profession: [employer]      Do you exercise?: Yes    Type and how often: Daily      Do you have a Living Will? (Form that indicates scenarios where you would not want your life prolonged) No      Do you have a DNR form?         If not, would you like to discuss one? No      Do you have signed POA/HPOA forms? No      Do you have difficulty bathing or dressing yourself? No      Do you have difficulty preparing food or eating? No      Do you have difficulty managing medications? No      Do you have difficulty managing your finances? No      Do you have difficulty affording your medications? No                     Social Determinants of Health   Financial Resource Strain: Low Risk  (02/17/2022)   Overall Financial Resource Strain (CARDIA)    Difficulty of Paying Living Expenses: Not hard at all  Food Insecurity: No Food Insecurity (02/17/2022)   Hunger Vital Sign    Worried About Running Out of Food in the Last Year: Never true    Ran Out of Food in the Last Year:  Never true  Transportation Needs: No Transportation Needs (02/17/2022)   PRAPARE - Hydrologist (Medical): No    Lack  of Transportation (Non-Medical): No  Physical Activity: Insufficiently Active (02/17/2022)   Exercise Vital Sign    Days of Exercise per Week: 3 days    Minutes of Exercise per Session: 30 min  Stress: No Stress Concern Present (02/17/2022)   [place]    Feeling of Stress : Not at all  Social Connections: Moderately Isolated (02/17/2022)   Social Connection and Isolation Panel [NHANES]    Frequency of Communication with Friends and Family: More than three times a week    Frequency of Social Gatherings with Friends and Family: More than three times a week    Attends Religious Services: More than 4 times per year    Active Member of [employer] or Organizations: No    Attends Archivist Meetings: Never    Marital Status: Widowed  Intimate Partner Violence: Not At Risk (02/17/2022)   Humiliation, Afraid, Rape, and Kick questionnaire    Fear of Current or Ex-Partner: No    Emotionally Abused: No    Physically Abused: No    Sexually Abused: No    Family History  Problem Relation Age of Onset   Diabetes Mother    Diabetes Father    Diabetes Other    Hypertension Sister    Hyperlipidemia Other    Heart disease Sister        defibrillator   Kidney failure Maternal Aunt        dialysis   Diabetes Maternal Aunt    Hypertension Maternal Aunt    Diabetes Maternal Aunt    Heart Problems Maternal Grandmother    Cancer Maternal Grandmother    Heart disease Maternal Grandmother    Hypertension Maternal Grandmother    Diabetes Brother    Colon cancer Neg Hx    Colon polyps Neg Hx    Rectal cancer Neg Hx    Stomach cancer Neg Hx      Review of Systems  Constitutional: Negative.  Negative for chills and fever.  HENT: Negative.  Negative for congestion and sore  throat.   Respiratory: Negative.  Negative for cough and shortness of breath.   Cardiovascular: Negative.  Negative for chest pain and palpitations.  Gastrointestinal:  Negative for abdominal pain, diarrhea, nausea and vomiting.  Genitourinary:  Positive for dysuria, frequency and urgency. Negative for flank pain and hematuria.  Skin: Negative.  Negative for rash.  Neurological:  Negative for dizziness and headaches.  All other systems reviewed and are negative.  Today's Vitals   05/27/22 1547  BP: 132/82  Pulse: (!) 56  Temp: 98.4 F (36.9 C)  TempSrc: Oral  SpO2: 92%  Weight: 102 lb (46.3 kg)  Height: '5\' 3"'$  (1.6 m)   Body mass index is 18.07 kg/m.   Physical Exam Vitals reviewed.  Constitutional:      Appearance: Normal appearance.  HENT:     Head: Normocephalic.     Mouth/Throat:     Mouth: Mucous membranes are moist.     Pharynx: Oropharynx is clear.  Eyes:     Extraocular Movements: Extraocular movements intact.     Pupils: Pupils are equal, round, and reactive to light.  Cardiovascular:     Rate and Rhythm: Normal rate and regular rhythm.     Pulses: Normal pulses.     Heart sounds: Normal heart sounds.  Pulmonary:     Effort: Pulmonary effort is normal.     Breath sounds: Normal breath sounds.  Abdominal:  Palpations: Abdomen is soft.     Tenderness: There is no abdominal tenderness. There is no right CVA tenderness or left CVA tenderness.  Musculoskeletal:     Cervical back: No tenderness.     Right lower leg: No edema.     Left lower leg: No edema.  Lymphadenopathy:     Cervical: No cervical adenopathy.  Skin:    General: Skin is warm and dry.  Neurological:     General: No focal deficit present.     Mental Status: She is alert and oriented to person, place, and time.  Psychiatric:        Mood and Affect: Mood normal.        Behavior: Behavior normal.    Results for orders placed or performed in visit on 05/27/22 (from the past 24 hour(s))   POCT Urinalysis Dipstick     Status: Abnormal   Collection Time: 05/27/22  4:01 PM  Result Value Ref Range   Color, UA amber    Clarity, UA cloudy    Glucose, UA Negative Negative   Bilirubin, UA negative    Ketones, UA negative    Spec Grav, UA 1.015 1.010 - 1.025   Blood, UA trace    pH, UA 7.0 5.0 - 8.0   Protein, UA Positive (A) Negative   Urobilinogen, UA 0.2 (A) 0.2 or 1.0 E.U./dL   Nitrite, UA negative    Leukocytes, UA Moderate (2+) (A) Negative   Appearance     Odor       ASSESSMENT & PLAN: A total of 43 minutes was spent with the patient and counseling/coordination of care regarding preparing for this visit, review of most recent office visit note, review of multiple chronic medical conditions under management, review of all medications, review of urinalysis done today, review of most recent blood work results, diagnosis of acute urinary tract infection and need to start antibiotics today, ED precautions, prognosis, documentation, need for follow-up if no better or worse during the next several days..  Problem List Items Addressed This Visit       Genitourinary   Acute UTI - Primary    Positive urinalysis.  Localized infection.  No systemic symptoms. No signs of pyelonephritis. Clinically stable.  No red flag signs or symptoms. Recommend to start Ceftin 250 mg twice a day for 7 days Urine sent for culture today.      Relevant Medications   cefUROXime (CEFTIN) 250 MG tablet   Other Relevant Orders   POCT Urinalysis Dipstick (Completed)   Urine Culture     Other   Dysuria    Secondary to urinary tract infection. May take over-the-counter Azo for symptom relief Advised to stay well-hydrated and start antibiotics today.      Relevant Orders   POCT Urinalysis Dipstick (Completed)   Urine Culture   Patient Instructions  Urinary Tract Infection, Adult A urinary tract infection (UTI) is an infection of any part of the urinary tract. The urinary tract  includes: The kidneys. The ureters. The bladder. The urethra. These organs make, store, and get rid of pee (urine) in the body. What are the causes? This infection is caused by germs (bacteria) in your genital area. These germs grow and cause swelling (inflammation) of your urinary tract. What increases the risk? The following factors may make you more likely to develop this condition: Using a small, thin tube (catheter) to drain pee. Not being able to control when you pee or poop (incontinence). Being female. If  you are female, these things can increase the risk: Using these methods to prevent pregnancy: A medicine that kills sperm (spermicide). A device that blocks sperm (diaphragm). Having low levels of a female hormone (estrogen). Being pregnant. You are more likely to develop this condition if: You have genes that add to your risk. You are sexually active. You take antibiotic medicines. You have trouble peeing because of: A prostate that is bigger than normal, if you are female. A blockage in the part of your body that drains pee from the bladder. A kidney stone. A nerve condition that affects your bladder. Not getting enough to drink. Not peeing often enough. You have other conditions, such as: Diabetes. A weak disease-fighting system (immune system). Sickle cell disease. Gout. Injury of the spine. What are the signs or symptoms? Symptoms of this condition include: Needing to pee right away. Peeing small amounts often. Pain or burning when peeing. Blood in the pee. Pee that smells bad or not like normal. Trouble peeing. Pee that is cloudy. Fluid coming from the vagina, if you are female. Pain in the belly or lower back. Other symptoms include: Vomiting. Not feeling hungry. Feeling mixed up (confused). This may be the first symptom in older adults. Being tired and grouchy (irritable). A fever. Watery poop (diarrhea). How is this treated? Taking antibiotic  medicine. Taking other medicines. Drinking enough water. In some cases, you may need to see a specialist. Follow these instructions at home:  Medicines Take over-the-counter and prescription medicines only as told by your doctor. If you were prescribed an antibiotic medicine, take it as told by your doctor. Do not stop taking it even if you start to feel better. General instructions Make sure you: Pee until your bladder is empty. Do not hold pee for a long time. Empty your bladder after sex. Wipe from front to back after peeing or pooping if you are a female. Use each tissue one time when you wipe. Drink enough fluid to keep your pee pale yellow. Keep all follow-up visits. Contact a doctor if: You do not get better after 1-2 days. Your symptoms go away and then come back. Get help right away if: You have very bad back pain. You have very bad pain in your lower belly. You have a fever. You have chills. You feeling like you will vomit or you vomit. Summary A urinary tract infection (UTI) is an infection of any part of the urinary tract. This condition is caused by germs in your genital area. There are many risk factors for a UTI. Treatment includes antibiotic medicines. Drink enough fluid to keep your pee pale yellow. This information is not intended to replace advice given to you by your health care provider. Make sure you discuss any questions you have with your health care provider. Document Revised: 12/02/2019 Document Reviewed: 12/02/2019 Elsevier Patient Education  [place], MD [facility]

## 2022-05-27 NOTE — Assessment & Plan Note (Signed)
Secondary to urinary tract infection. May take over-the-counter Azo for symptom relief Advised to stay well-hydrated and start antibiotics today.

## 2022-05-27 NOTE — Assessment & Plan Note (Signed)
Positive urinalysis.  Localized infection.  No systemic symptoms. No signs of pyelonephritis. Clinically stable.  No red flag signs or symptoms. Recommend to start Ceftin 250 mg twice a day for 7 days Urine sent for culture today.

## 2022-05-27 NOTE — Progress Notes (Signed)
Office Visit    Patient Name: Terry Armstrong Date of Encounter: 05/27/2022  PCP:  Horald Pollen, [place]  Cardiologist:  Skeet Latch, MD  Advanced Practice Provider:  No care team member to display Electrophysiologist:  None      Chief Complaint    Terry Armstrong is a 77 y.o. female presents today for exertional dyspnea   Past Medical History    Past Medical History:  Diagnosis Date   Allergy    Breast abscess    Constipation    if drinks alot of water no issues - uses womens stool softener PRN only    Diabetes mellitus without complication ([place])    pt has no meds- diet controlled only    Exertional dyspnea 08/18/2019   H/O mammogram 05/06/2019   Per [place] new patient packet   Hx of adenomatous colonic polyps 06/19/2017   Hypercholesteremia    Per [place] new patient packet   Hyperlipidemia    Hypertension    Per [place] new patient packet   Palpitations 08/18/2019   Primary snoring    Thyroid condition    Per [facility] new patient packet   VT (ventricular tachycardia) ([place]) 09/11/2019   Past Surgical History:  Procedure Laterality Date   ABDOMINAL HYSTERECTOMY  1993   COLONOSCOPY     POLYPECTOMY      Allergies  No Known Allergies  History of Present Illness    Terry Armstrong is a 77 y.o. female with a hx of PVC, NSVT, HLD, coronary calcification on CT, hypertension last seen 10/04/19 by Dr. Oval Linsey.  Prior ETT 2021 negative but with poor exercise tolerance only exercising for 1 minutes 45 seconds. Subsequent Myoview 09/2019 LVEF 57% no ischemia. She wore monitor with episodes of NSVT and was start on Metoprolol. Echo 2021 with normal LVEF, no significant valvular abnormalities.   She presents today to re-establish with cardiology. Shares with me that she lost husband, brother, and sister to congestive heart failure within the last 2 years. She notes she does not have much of a support system as much of her family is [place]. She is  involved with her church.  Reports no shortness of breath at rest but does note dyspnea on exertion with more than usual activity such as stairs in her home. This has been more noticeable over the last year. Reports no chest pain, pressure, or tightness. No edema, orthopnea, PND. Reports no palpitations.    EKGs/Labs/Other Studies Reviewed:   The following studies were reviewed today: 30 Day Event Monitor 09/23/19: Quality: Fair.  Baseline artifact. Predominant rhythm: Sinus rhythm Average heart rate: 89 bpm Max heart rate: 145 bpm Min heart rate: 56 bpm Pauses >2.5 seconds: None   Frequent PVCs and NSVT noted Ventricular bigeminy and trigeminy   ETT 09/01/19: Blood pressure demonstrated a normal response to exercise. There was no ST segment deviation noted during stress. No T wave inversion was noted during stress.   Poor exercise capacity, the patient was only able to exercise for 1 minute 46 seconds. Frequent PVCs. Consider alternative stress test with imaging.    Lexiscan Myoview 09/23/19: The left ventricular ejection fraction is normal (55-65%). Nuclear stress EF: 57%. There was no ST segment deviation noted during stress. The study is normal. This is a low risk study. No prior study for comparison.   Echo 09/11/19:  1. Left ventricular ejection fraction, by estimation, is 65 to 70%. The  left ventricle has normal  function. The left ventricle has no regional  wall motion abnormalities. Left ventricular diastolic parameters were  normal.   2. Right ventricular systolic function is normal. The right ventricular  size is normal.   3. The mitral valve is normal in structure. Trivial mitral valve  regurgitation. No evidence of mitral stenosis.   4. The aortic valve is normal in structure. Aortic valve regurgitation is  not visualized. No aortic stenosis is present.   EKG:  EKG is ordered today.  The ekg ordered today demonstrates NSR 79 bpm with no acute St/T wave changes.    Recent Labs: 01/21/2022: TSH 0.17 05/21/2022: ALT 18; BUN 12; Creatinine, Ser 0.65; Hemoglobin 14.0; Platelets 285.0; Potassium 4.1; Sodium 141  Recent Lipid Panel    Component Value Date/Time   CHOL 175 05/21/2022 0928   CHOL 180 01/20/2020 0815   TRIG 48.0 05/21/2022 0928   HDL 98.50 05/21/2022 0928   HDL 59 01/20/2020 0815   CHOLHDL 2 05/21/2022 0928   VLDL 9.6 05/21/2022 0928   LDLCALC 67 05/21/2022 0928   LDLCALC 75 07/26/2021 1339   LDLDIRECT 81 04/29/2018 1605     Home Medications   Current Meds  Medication Sig   albuterol (VENTOLIN HFA) 108 (90 Base) MCG/ACT inhaler INHALE 2 PUFFS INTO THE LUNGS  EVERY 6 HOURS AS NEEDED FOR WHEEZING FOR SHORTNESS OF BREATH   alendronate (FOSAMAX) 70 MG tablet Take one tablet once weekly. Take with a full glass of water on an empty stomach.   aspirin EC 81 MG tablet Take 1 tablet (81 mg total) by mouth daily.   atorvastatin (LIPITOR) 40 MG tablet Take 1 tablet (40 mg total) by mouth daily.   escitalopram (LEXAPRO) 10 MG tablet Take 1 tablet (10 mg total) by mouth daily.   fluticasone (FLONASE) 50 MCG/ACT nasal spray Place 1 spray into both nostrils daily.   Fluticasone-Umeclidin-Vilant (TRELEGY ELLIPTA) 100-62.5-25 MCG/ACT AEPB Inhale 1 puff into the lungs daily.   losartan (COZAAR) 50 MG tablet Take 1 tablet (50 mg total) by mouth daily.   metoprolol succinate (TOPROL-XL) 25 MG 24 hr tablet Take 1 tablet (25 mg total) by mouth daily.   multivitamin-iron-minerals-folic acid (CENTRUM) chewable tablet Chew 1 tablet by mouth daily.   Omega 3 1000 MG CAPS Take by mouth daily.     Review of Systems      All other systems reviewed and are otherwise negative except as noted above.  Physical Exam    VS:  BP 130/86   Ht '5\' 3"'$  (1.6 m)   Wt 102 lb 12.8 oz (46.6 kg)   BMI 18.21 kg/m  , BMI Body mass index is 18.21 kg/m.  Wt Readings from Last 3 Encounters:  05/27/22 102 lb 12.8 oz (46.6 kg)  05/21/22 103 lb (46.7 kg)  03/04/22 100 lb  8 oz (45.6 kg)    GEN: Well nourished, well developed, in no acute distress. HEENT: normal. Neck: Supple, no JVD, carotid bruits, or masses. Cardiac: RRR, no murmurs, rubs, or gallops. No clubbing, cyanosis, edema.  Radials/PT 2+ and equal bilaterally.  Respiratory:  Respirations regular and unlabored, clear to auscultation bilaterally. GI: Soft, nontender, nondistended. MS: No deformity or atrophy. Skin: Warm and dry, no rash. Neuro:  Strength and sensation are intact. Psych: Normal affect.  Assessment & Plan    Dyspnea on exertion - 1 year history of exertional dyspnea with activities such as stairs. EKG today nonacute. Family history of heart failure and known coronary calcification by CT. Plan  for Lexiscan myoview to rule out ischemia and echocardiogram to assess for heart failure or valvular abnormalities. Recent CBC with no anemia. Refer to PREP exercise program at [facility] as some of her symptoms may be related to deconditioning.   Shared Decision Making/Informed Consent The risks [chest pain, shortness of breath, cardiac arrhythmias, dizziness, blood pressure fluctuations, myocardial infarction, stroke/transient ischemic attack, nausea, vomiting, allergic reaction, radiation exposure, metallic taste sensation and life-threatening complications (estimated to be 1 in 10,000)], benefits (risk stratification, diagnosing coronary artery disease, treatment guidance) and alternatives of a nuclear stress test were discussed in detail with Ms. Ramnauth and she agrees to proceed.   Coronary calcification on CT - CT 05/2020 two vessel coronary atherosclerosis. Reports no chest pain. Does note exertional dyspnea over the last year. EKG today NSR with no acute ST/T wave changes. Plan for lexiscan myoview to rule out ischemia. GDMT includes Aspirin, Atorvastatin. Metoprolol.   Tobacco use - Smoking cessation encouraged. Recommend utilization of [id number]. She is overdue for CT lung cancer screening and  encouraged to call and schedule.   HLD - LDL goal <70 given coronary calcification on CT. 05/21/22 LDL 67. Continue Atorvsatatin '40mg'$  daily.   PSVT / PVC - Quiescent on Toprol '25mg'$  QD. Continue same.   HTN - BP reasonably well controlled on Losartan '50mg'$  QD. Continue same. Appreciate escalation of antihypertensive regimen by her primary care provider.         Disposition: Follow up in 2-3 month(s) with Skeet Latch, MD or APP.  Signed, Loel Dubonnet, NP 05/27/2022, 2:35 PM [facility]

## 2022-05-27 NOTE — Patient Instructions (Signed)
Urinary Tract Infection, Adult ?A urinary tract infection (UTI) is an infection of any part of the urinary tract. The urinary tract includes: ?The kidneys. ?The ureters. ?The bladder. ?The urethra. ?These organs make, store, and get rid of pee (urine) in the body. ?What are the causes? ?This infection is caused by germs (bacteria) in your genital area. These germs grow and cause swelling (inflammation) of your urinary tract. ?What increases the risk? ?The following factors may make you more likely to develop this condition: ?Using a small, thin tube (catheter) to drain pee. ?Not being able to control when you pee or poop (incontinence). ?Being female. If you are female, these things can increase the risk: ?Using these methods to prevent pregnancy: ?A medicine that kills sperm (spermicide). ?A device that blocks sperm (diaphragm). ?Having low levels of a female hormone (estrogen). ?Being pregnant. ?You are more likely to develop this condition if: ?You have genes that add to your risk. ?You are sexually active. ?You take antibiotic medicines. ?You have trouble peeing because of: ?A prostate that is bigger than normal, if you are female. ?A blockage in the part of your body that drains pee from the bladder. ?A kidney stone. ?A nerve condition that affects your bladder. ?Not getting enough to drink. ?Not peeing often enough. ?You have other conditions, such as: ?Diabetes. ?A weak disease-fighting system (immune system). ?Sickle cell disease. ?Gout. ?Injury of the spine. ?What are the signs or symptoms? ?Symptoms of this condition include: ?Needing to pee right away. ?Peeing small amounts often. ?Pain or burning when peeing. ?Blood in the pee. ?Pee that smells bad or not like normal. ?Trouble peeing. ?Pee that is cloudy. ?Fluid coming from the vagina, if you are female. ?Pain in the belly or lower back. ?Other symptoms include: ?Vomiting. ?Not feeling hungry. ?Feeling mixed up (confused). This may be the first symptom in  older adults. ?Being tired and grouchy (irritable). ?A fever. ?Watery poop (diarrhea). ?How is this treated? ?Taking antibiotic medicine. ?Taking other medicines. ?Drinking enough water. ?In some cases, you may need to see a specialist. ?Follow these instructions at home: ? ?Medicines ?Take over-the-counter and prescription medicines only as told by your doctor. ?If you were prescribed an antibiotic medicine, take it as told by your doctor. Do not stop taking it even if you start to feel better. ?General instructions ?Make sure you: ?Pee until your bladder is empty. ?Do not hold pee for a long time. ?Empty your bladder after sex. ?Wipe from front to back after peeing or pooping if you are a female. Use each tissue one time when you wipe. ?Drink enough fluid to keep your pee pale yellow. ?Keep all follow-up visits. ?Contact a doctor if: ?You do not get better after 1-2 days. ?Your symptoms go away and then come back. ?Get help right away if: ?You have very bad back pain. ?You have very bad pain in your lower belly. ?You have a fever. ?You have chills. ?You feeling like you will vomit or you vomit. ?Summary ?A urinary tract infection (UTI) is an infection of any part of the urinary tract. ?This condition is caused by germs in your genital area. ?There are many risk factors for a UTI. ?Treatment includes antibiotic medicines. ?Drink enough fluid to keep your pee pale yellow. ?This information is not intended to replace advice given to you by your health care provider. Make sure you discuss any questions you have with your health care provider. ?Document Revised: 12/02/2019 Document Reviewed: 12/02/2019 ?Elsevier Patient Education ?   [place].

## 2022-05-27 NOTE — Patient Instructions (Signed)
Medication Instructions:  Your Physician recommend you continue on your current medication as directed.    *If you need a refill on your cardiac medications before your next appointment, please call your pharmacy*  Testing/Procedures: Your Physician has requested you have a Myocardial Perfusion Imaging Study.  Please arrive 15 minutes prior to your appointment time for registration and insurance purposes.   The test will take approximately 3 to 4 hours to complete; you may bring reading material. If someone comes with you to your appointment, they will need to remain in the main lobby due to limited testing space in the testing area. **If you are pregnant or breastfeeding, please notify the nuclear lab prior to your appointment**   How to prepare for your test:  - Do not eat or drink 3 hours prior to your test, except you may have water  - Do not consume products containing caffeine ( regular or decaf) 12 hours prior to your test ( coffee, chocolate, sodas or teas)  - Do bring a list of your current medications with you. If not listed below, you may take your medications as normal (Hold beta blocker- 24 hour prior for exercise myoview)   - Do wear comfortable clothes (no dresses or overalls) and walking shoes ( tennis shoes preferred), no heel or open toe shoes are allowed - Do not wear cologne, perfume, aftershave, or lotions ( deodorant is allowed)  - If these instructions are not followed, your test will be rescheduled   If you cannot keep your appointment, please provide 24 hours notice to the Nuclear Lab, to avoid a possible $50 charge to your account!   Your physician has requested that you have an echocardiogram. Echocardiography is a painless test that uses sound waves to create images of your heart. It provides your doctor with information about the size and shape of your heart and how well your heart's chambers and valves are working. This procedure takes approximately one hour. There  are no restrictions for this procedure. Please do NOT wear cologne, perfume, aftershave, or lotions (deodorant is allowed). Please arrive 15 minutes prior to your appointment time.    Follow-Up: At [facility], you and your health needs are our priority.  As part of our continuing mission to provide you with exceptional heart care, we have created designated Provider Care Teams.  These Care Teams include your primary Cardiologist (physician) and Advanced Practice Providers (APPs -  Physician Assistants and Nurse Practitioners) who all work together to provide you with the care you need, when you need it.  We recommend signing up for the patient portal called "MyChart".  Sign up information is provided on this After Visit Summary.  MyChart is used to connect with patients for Virtual Visits (Telemedicine).  Patients are able to view lab/test results, encounter notes, upcoming appointments, etc.  Non-urgent messages can be sent to your provider as well.   To learn more about what you can do with MyChart, go to [email or web address].    Your next appointment:   2-3 month(s)  Provider:   Skeet Latch, MD or Laurann Montana, NP    Other Instructions Heart Healthy Diet Recommendations: A low-salt diet is recommended. Meats should be grilled, baked, or boiled. Avoid fried foods. Focus on lean protein sources like fish or chicken with vegetables and fruits. The American Heart Association is a Microbiologist!  American Heart Association Diet and Lifeystyle Recommendations   Exercise recommendations: The American Heart Association recommends 150 minutes of moderate  intensity exercise weekly. Try 30 minutes of moderate intensity exercise 4-5 times per week. This could include walking, jogging, or swimming.

## 2022-05-31 LAB — URINE CULTURE

## 2022-06-03 ENCOUNTER — Telehealth: Payer: Self-pay

## 2022-06-03 NOTE — Telephone Encounter (Signed)
Attempted to reach pt reference PREP referral.  Message: "not accepting calls from this number" Will attempt some other time

## 2022-06-06 NOTE — Progress Notes (Signed)
Thanks

## 2022-06-12 ENCOUNTER — Telehealth (HOSPITAL_COMMUNITY): Payer: Self-pay | Admitting: *Deleted

## 2022-06-12 NOTE — Telephone Encounter (Signed)
Pt reached and given instructions for MPI study scheduled on 06/19/22.

## 2022-06-16 ENCOUNTER — Ambulatory Visit
Admission: EM | Admit: 2022-06-16 | Discharge: 2022-06-16 | Disposition: A | Discharge status: Home / Self Care | Payer: PPO | Attending: Physician Assistant | Admitting: Physician Assistant

## 2022-06-16 DIAGNOSIS — J069 Acute upper respiratory infection, unspecified: Secondary | ICD-10-CM | POA: Insufficient documentation

## 2022-06-16 DIAGNOSIS — Z1152 Encounter for screening for COVID-19: Secondary | ICD-10-CM | POA: Diagnosis present

## 2022-06-16 NOTE — ED Provider Notes (Signed)
[facility]    CSN: [id number] Arrival date & time: 06/16/22  1439      History   Chief Complaint Chief Complaint  Patient presents with   URI    HPI Terry Armstrong is a 77 y.o. female.   Patient here today for evaluation of cough congestion she has had for 3 days.  She has not had any fever.  She denies any vomiting or diarrhea.  He has had decreased appetite.  She has tried over-the-counter cough medication without resolution of symptoms.  She denies any known sick contacts  The history is provided by the patient.  URI Presenting symptoms: congestion and cough   Presenting symptoms: no ear pain, no fever and no sore throat   Associated symptoms: no wheezing     Past Medical History:  Diagnosis Date   Allergy    Breast abscess    Constipation    if drinks alot of water no issues - uses womens stool softener PRN only    Diabetes mellitus without complication ([place])    pt has no meds- diet controlled only    Exertional dyspnea 08/18/2019   H/O mammogram 05/06/2019   Per [facility] new patient packet   Hx of adenomatous colonic polyps 06/19/2017   Hypercholesteremia    Per [facility] new patient packet   Hyperlipidemia    Hypertension    Per [facility] new patient packet   Palpitations 08/18/2019   Primary snoring    Thyroid condition    Per [facility] new patient packet   VT (ventricular tachycardia) ([place]) 09/11/2019    Patient Active Problem List   Diagnosis Date Noted   Dysuria 05/27/2022   Acute UTI 05/27/2022   Situational mixed anxiety and depressive disorder 01/21/2022   Aortic atherosclerosis ([place]) 06/19/2020   Hx of adenomatous colonic polyps 06/19/2017   Multinodular goiter 03/15/2017   Centrilobular emphysema ([place]) 11/09/2016   Periodic limb movement sleep disorder 01/11/2015   Abnormal TSH 01/11/2015   Upper airway resistance syndrome 01/11/2015   Mild obstructive sleep apnea 01/11/2015   Osteoporosis 01/10/2015   Non-restorative sleep 05/23/2013   Primary  snoring    HTN (hypertension) 05/11/2013   DM (diabetes mellitus) ([place]) 06/04/2011   Tobacco user 06/04/2011   Hyperlipidemia LDL goal <70 06/04/2011    Past Surgical History:  Procedure Laterality Date   ABDOMINAL HYSTERECTOMY  1993   COLONOSCOPY     POLYPECTOMY      OB History   No obstetric history on file.      Home Medications    Prior to Admission medications   Medication Sig Start Date End Date Taking? Authorizing Provider  albuterol (VENTOLIN HFA) 108 (90 Base) MCG/ACT inhaler INHALE 2 PUFFS INTO THE LUNGS  EVERY 6 HOURS AS NEEDED FOR WHEEZING FOR SHORTNESS OF BREATH 05/21/22   Horald Pollen, MD  alendronate (FOSAMAX) 70 MG tablet Take one tablet once weekly. Take with a full glass of water on an empty stomach. 06/19/20   Ngetich, Dinah C, NP  aspirin EC 81 MG tablet Take 1 tablet (81 mg total) by mouth daily. 06/28/19   Forrest Moron, MD  atorvastatin (LIPITOR) 40 MG tablet Take 1 tablet (40 mg total) by mouth daily. 02/18/22   Horald Pollen, MD  escitalopram (LEXAPRO) 10 MG tablet Take 1 tablet (10 mg total) by mouth daily. 02/18/22 02/13/23  Horald Pollen, MD  fluticasone [facility]) 50 MCG/ACT nasal spray Place 1 spray into both nostrils daily. 01/16/22  Mound, [place] E, FNP  Fluticasone-Umeclidin-Vilant (TRELEGY ELLIPTA) 100-62.5-25 MCG/ACT AEPB Inhale 1 puff into the lungs daily. 01/21/22   Horald Pollen, MD  losartan (COZAAR) 50 MG tablet Take 1 tablet (50 mg total) by mouth daily. 05/21/22   Horald Pollen, MD  metoprolol succinate (TOPROL-XL) 25 MG 24 hr tablet Take 1 tablet (25 mg total) by mouth daily. 02/18/22 02/13/23  Horald Pollen, MD  multivitamin-iron-minerals-folic acid (CENTRUM) chewable tablet Chew 1 tablet by mouth daily.    [provider]  Omega 3 1000 MG CAPS Take by mouth daily.    [provider]    Family History Family History  Problem Relation Age of Onset   Diabetes Mother     Diabetes Father    Diabetes Other    Hypertension Sister    Hyperlipidemia Other    Heart disease Sister        defibrillator   Kidney failure Maternal Aunt        dialysis   Diabetes Maternal Aunt    Hypertension Maternal Aunt    Diabetes Maternal Aunt    Heart Problems Maternal Grandmother    Cancer Maternal Grandmother    Heart disease Maternal Grandmother    Hypertension Maternal Grandmother    Diabetes Brother    Colon cancer Neg Hx    Colon polyps Neg Hx    Rectal cancer Neg Hx    Stomach cancer Neg Hx     Social History Social History   Tobacco Use   Smoking status: Every Day    Packs/day: 1.50    Years: 49.00    Total pack years: 73.50    Types: Cigarettes   Smokeless tobacco: Never   Tobacco comments:    1/2 ppd 01/16/20  Vaping Use   Vaping Use: Never used  Substance Use Topics   Alcohol use: Yes    Comment: 2-4 per week   Drug use: No     Allergies   Patient has no known allergies.   Review of Systems Review of Systems  Constitutional:  Negative for chills and fever.  HENT:  Positive for congestion. Negative for ear pain and sore throat.   Eyes:  Negative for discharge and redness.  Respiratory:  Positive for cough. Negative for shortness of breath and wheezing.   Gastrointestinal:  Negative for abdominal pain, diarrhea, nausea and vomiting.     Physical Exam Triage Vital Signs ED Triage Vitals  Enc Vitals Group     BP 06/16/22 1552 125/68     Pulse Rate 06/16/22 1552 85     Resp 06/16/22 1552 17     Temp 06/16/22 1552 98.5 F (36.9 C)     Temp Source 06/16/22 1552 Oral     SpO2 06/16/22 1552 95 %     Weight --      Height --      Head Circumference --      Peak Flow --      Pain Score 06/16/22 1551 2     Pain Loc --      Pain Edu? --      Excl. in [place]? --    No data found.  Updated Vital Signs BP 125/68 (BP Location: Left Arm)   Pulse 85   Temp 98.5 F (36.9 C) (Oral)   Resp 17   SpO2 95%     Physical Exam Vitals and  nursing note reviewed.  Constitutional:      General: She is not  in acute distress.    Appearance: Normal appearance. She is not ill-appearing.  HENT:     Head: Normocephalic and atraumatic.     Nose: Congestion present.     Mouth/Throat:     Mouth: Mucous membranes are moist.     Pharynx: No oropharyngeal exudate or posterior oropharyngeal erythema.  Eyes:     Conjunctiva/sclera: Conjunctivae normal.  Cardiovascular:     Rate and Rhythm: Normal rate and regular rhythm.     Heart sounds: Normal heart sounds. No murmur heard. Pulmonary:     Effort: Pulmonary effort is normal. No respiratory distress.     Breath sounds: Normal breath sounds. No wheezing, rhonchi or rales.  Skin:    General: Skin is warm and dry.  Neurological:     Mental Status: She is alert.  Psychiatric:        Mood and Affect: Mood normal.        Thought Content: Thought content normal.      UC Treatments / Results  Labs (all labs ordered are listed, but only abnormal results are displayed) Labs Reviewed  SARS CORONAVIRUS 2 (TAT 6-24 HRS)    EKG   Radiology No results found.  Procedures Procedures (including critical care time)  Medications Ordered in UC Medications - No data to display  Initial Impression / Assessment and Plan / UC Course  I have reviewed the triage vital signs and the nursing notes.  Pertinent labs & imaging results that were available during my care of the patient were reviewed by me and considered in my medical decision making (see chart for details).   Suspect likely viral etiology of symptoms.  Will screen for COVID.  Encouraged symptomatic treatment with follow-up if no gradual improvement or with any further concerns.   Final Clinical Impressions(s) / UC Diagnoses   Final diagnoses:  Encounter for screening for COVID-19  Acute upper respiratory infection   Discharge Instructions   None    ED Prescriptions   None    PDMP not reviewed this encounter.    Francene Finders, PA-C 06/16/22 1655

## 2022-06-16 NOTE — ED Triage Notes (Signed)
Pt presents with cough and congestion X 3 days.

## 2022-06-17 LAB — SARS CORONAVIRUS 2 (TAT 6-24 HRS): SARS Coronavirus 2: NEGATIVE

## 2022-06-19 ENCOUNTER — Ambulatory Visit (HOSPITAL_BASED_OUTPATIENT_CLINIC_OR_DEPARTMENT_OTHER): Payer: PPO

## 2022-06-19 ENCOUNTER — Ambulatory Visit (HOSPITAL_COMMUNITY): Payer: PPO | Attending: Cardiology

## 2022-06-19 DIAGNOSIS — R0609 Other forms of dyspnea: Secondary | ICD-10-CM

## 2022-06-19 DIAGNOSIS — I2510 Atherosclerotic heart disease of native coronary artery without angina pectoris: Secondary | ICD-10-CM

## 2022-06-19 LAB — MYOCARDIAL PERFUSION IMAGING
Base ST Depression (mm): 0 mm
LV dias vol: 71 mL (ref 46–106)
LV sys vol: 25 mL
Nuc Stress EF: 66 %
Peak HR: 113 {beats}/min
Rest HR: 88 {beats}/min
Rest Nuclear Isotope Dose: 10.9 mCi
SDS: 0
SRS: 0
SSS: 0
ST Depression (mm): 0 mm
Stress Nuclear Isotope Dose: 31.2 mCi
TID: 1.06

## 2022-06-19 LAB — ECHOCARDIOGRAM COMPLETE
Area-P 1/2: 2.82 cm2
S' Lateral: 2.3 cm

## 2022-06-19 MED ORDER — TECHNETIUM TC 99M TETROFOSMIN IV KIT
10.9000 | PACK | Freq: Once | INTRAVENOUS | Status: AC | PRN
  Administered 2022-06-19: 10.9 via INTRAVENOUS

## 2022-06-19 MED ORDER — REGADENOSON 0.4 MG/5ML IV SOLN
0.4000 mg | Freq: Once | INTRAVENOUS | Status: AC
  Administered 2022-06-19: 0.4 mg via INTRAVENOUS

## 2022-06-19 MED ORDER — TECHNETIUM TC 99M TETROFOSMIN IV KIT
31.2000 | PACK | Freq: Once | INTRAVENOUS | Status: AC | PRN
  Administered 2022-06-19: 31.2 via INTRAVENOUS

## 2022-07-31 ENCOUNTER — Other Ambulatory Visit: Payer: Self-pay | Admitting: Emergency Medicine

## 2022-07-31 DIAGNOSIS — J432 Centrilobular emphysema: Secondary | ICD-10-CM

## 2022-08-26 ENCOUNTER — Ambulatory Visit (HOSPITAL_BASED_OUTPATIENT_CLINIC_OR_DEPARTMENT_OTHER): Payer: PPO | Admitting: Family

## 2022-09-15 ENCOUNTER — Telehealth: Payer: Self-pay | Admitting: Emergency Medicine

## 2022-09-15 NOTE — Telephone Encounter (Signed)
Contacted Terry Armstrong to schedule their annual wellness visit. Appointment made for 10/06/2022.  [facility] Care Guide [facility] AW[facility] TEAM Direct Dial: [phone]

## 2022-09-19 ENCOUNTER — Encounter (HOSPITAL_BASED_OUTPATIENT_CLINIC_OR_DEPARTMENT_OTHER): Payer: Self-pay | Admitting: Family

## 2022-09-19 ENCOUNTER — Ambulatory Visit (HOSPITAL_BASED_OUTPATIENT_CLINIC_OR_DEPARTMENT_OTHER): Payer: PPO | Admitting: Family

## 2022-09-19 VITALS — BP 130/84 | HR 79 | Ht 63.0 in | Wt 105.0 lb

## 2022-09-19 DIAGNOSIS — I493 Ventricular premature depolarization: Secondary | ICD-10-CM | POA: Diagnosis not present

## 2022-09-19 DIAGNOSIS — E785 Hyperlipidemia, unspecified: Secondary | ICD-10-CM

## 2022-09-19 DIAGNOSIS — I2510 Atherosclerotic heart disease of native coronary artery without angina pectoris: Secondary | ICD-10-CM

## 2022-09-19 DIAGNOSIS — Z720 Tobacco use: Secondary | ICD-10-CM | POA: Diagnosis not present

## 2022-09-19 DIAGNOSIS — I10 Essential (primary) hypertension: Secondary | ICD-10-CM

## 2022-09-19 MED ORDER — METOPROLOL SUCCINATE ER 25 MG PO TB24: ORAL_TABLET | ORAL | 1 refills | Status: AC

## 2022-09-19 NOTE — Patient Instructions (Addendum)
Medication Instructions:  Your physician has recommended you make the following change in your medication:   Change: Metoprolol 1/2 tablet PRN in the evening as needed for palpitations   Follow-Up: At [facility], you and your health needs are our priority.  As part of our continuing mission to provide you with exceptional heart care, we have created designated Provider Care Teams.  These Care Teams include your primary Cardiologist (physician) and Advanced Practice Providers (APPs -  Physician Assistants and Nurse Practitioners) who all work together to provide you with the care you need, when you need it.  We recommend signing up for the patient portal called "MyChart".  Sign up information is provided on this After Visit Summary.  MyChart is used to connect with patients for Virtual Visits (Telemedicine).  Patients are able to view lab/test results, encounter notes, upcoming appointments, etc.  Non-urgent messages can be sent to your provider as well.   To learn more about what you can do with MyChart, go to [email or web address].    Your next appointment:   6 month(s)  Provider:   Chilton Si, MD or [name], NP    Other Instructions Recommend discussing scheduling your CT lung cancer screening with your primary care provider as last was 05/2020 and recommended for every year.

## 2022-09-19 NOTE — Progress Notes (Unsigned)
Office Visit    Patient Name: Terry Armstrong Date of Encounter: 09/19/2022  PCP:  Georgina Quint, MD   [facility]  Cardiologist:  Chilton Si, MD  Advanced Practice Provider:  No care team member to display Electrophysiologist:  None      Chief Complaint    Terry Armstrong is a 77 y.o. female presents today for follow-up after Lexiscan Myoview and echocardiogram.  Past Medical History    Past Medical History:  Diagnosis Date   Allergy    Breast abscess    Constipation    if drinks alot of water no issues - uses womens stool softener PRN only    Diabetes mellitus without complication (HCC)    pt has no meds- diet controlled only    Exertional dyspnea 08/18/2019   H/O mammogram 05/06/2019   Per [facility] new patient packet   Hx of adenomatous colonic polyps 06/19/2017   Hypercholesteremia    Per [facility] new patient packet   Hyperlipidemia    Hypertension    Per [facility] new patient packet   Palpitations 08/18/2019   Primary snoring    Thyroid condition    Per [facility] new patient packet   VT (ventricular tachycardia) (HCC) 09/11/2019   Past Surgical History:  Procedure Laterality Date   ABDOMINAL HYSTERECTOMY  1993   COLONOSCOPY     POLYPECTOMY      Allergies  No Known Allergies  History of Present Illness    Terry Armstrong is a 77 y.o. female with a hx of PVC, NSVT, HLD, coronary calcification on CT, hypertension last seen 05/27/2022.  Prior ETT 2021 negative but with poor exercise tolerance only exercising for 1 minutes 45 seconds. Subsequent Myoview 09/2019 LVEF 57% no ischemia. She wore monitor with episodes of NSVT and was start on Metoprolol. Echo 2021 with normal LVEF, no significant valvular abnormalities.   Lysine 05/27/2038 reestablish with cardiology.  She noted exertional dyspnea over the last year.  Lexiscan Myoview ordered and performed 06/19/2022 was low risk study with no evidence of ischemia.  Echo 06/19/2022 ordered as she had multiple  family members with heart failure and she reported dyspnea with normal LVEF 60 to 65%, small pericardial effusion with no evidence of tamponade, no significant valvular abnormalities.  She was referred to PREP exercise program.  She presents today for follow-up. Enjoys working in her flower garden. She is involved with her church. Reports her exertional dyspnea is overall intermittent. She is using her Trelegy only intermittently. She is reassured by her cardiac workup and feels her symptoms have improved.  EKGs/Labs/Other Studies Reviewed:   The following studies were reviewed today:  Cardiac Studies & Procedures     STRESS TESTS  MYOCARDIAL PERFUSION IMAGING 06/19/2022  Narrative   The patient reported dyspnea during the stress test. Normal blood pressure and normal heart rate response noted during stress. Heart rate recovery was normal.   No ST deviation was noted. There were no arrhythmias during stress. There were no arrhythmias during recovery. ECG was interpretable and conclusive. The ECG was not diagnostic due to pharmacologic protocol.   Image quality affected due to significant extracardiac activity.   LV perfusion is abnormal. There is no evidence of ischemia. There is no evidence of infarction. Defect 1: There is a small defect with mild reduction in uptake present in the apical apex location(s) that is fixed. There is normal wall motion in the defect area. Consistent with artifact caused by bowel tracer uptake.  Left ventricular function is normal. Nuclear stress EF: 66 %. The left ventricular ejection fraction is hyperdynamic (>65%). End diastolic cavity size is normal. End systolic cavity size is normal.   Prior study available for comparison from 09/23/2019.   The study is normal. The study is low risk.   ECHOCARDIOGRAM  ECHOCARDIOGRAM COMPLETE 06/19/2022  Narrative ECHOCARDIOGRAM REPORT    Patient Name:   Terry Armstrong Date of Exam: 06/19/2022 Medical Rec #:  [id number]      Height:       63.0 in Accession #:    [id number]    Weight:       102.0 lb Date of Birth:  Oct 07, 1945    BSA:          1.452 m Patient Age:    76 years      BP:           130/86 mmHg Patient Gender: F             HR:           66 bpm. Exam Location:  [address]  Procedure: 2D Echo, Cardiac Doppler and Color Doppler  Indications:    R06.00 Dyspnea  History:        Patient has prior history of Echocardiogram examinations, most recent 09/11/2019. Arrythmias:PVC; Risk Factors:Hypertension, Diabetes and Dyslipidemia. Dyspnea on exertion. Coronary artery calcification seen on CAT scan. Palpitations. NSVT.  Sonographer:    Cathie Beams RCS Referring Phys: [phone] Kassidy Frankson S Muhamed Luecke  IMPRESSIONS   1. Left ventricular ejection fraction, by estimation, is 60 to 65%. The left ventricle has normal function. The left ventricle has no regional wall motion abnormalities. Left ventricular diastolic parameters are consistent with Grade I diastolic dysfunction (impaired relaxation). 2. Right ventricular systolic function is normal. The right ventricular size is normal. 3. A small pericardial effusion is present. The pericardial effusion is circumferential. 4. The mitral valve is normal in structure. Mild mitral valve regurgitation. No evidence of mitral stenosis. 5. The aortic valve is normal in structure. Aortic valve regurgitation is not visualized. No aortic stenosis is present. 6. The inferior vena cava is normal in size with greater than 50% respiratory variability, suggesting right atrial pressure of 3 mmHg.  FINDINGS Left Ventricle: Left ventricular ejection fraction, by estimation, is 60 to 65%. The left ventricle has normal function. The left ventricle has no regional wall motion abnormalities. The left ventricular internal cavity size was normal in size. There is no left ventricular hypertrophy. Left ventricular diastolic parameters are consistent with Grade I diastolic dysfunction  (impaired relaxation).  Right Ventricle: The right ventricular size is normal. No increase in right ventricular wall thickness. Right ventricular systolic function is normal.  Left Atrium: Left atrial size was normal in size.  Right Atrium: Right atrial size was normal in size.  Pericardium: A small pericardial effusion is present. The pericardial effusion is circumferential.  Mitral Valve: The mitral valve is normal in structure. Mild mitral valve regurgitation. No evidence of mitral valve stenosis.  Tricuspid Valve: The tricuspid valve is normal in structure. Tricuspid valve regurgitation is not demonstrated. No evidence of tricuspid stenosis.  Aortic Valve: The aortic valve is normal in structure. Aortic valve regurgitation is not visualized. No aortic stenosis is present.  Pulmonic Valve: The pulmonic valve was normal in structure. Pulmonic valve regurgitation is not visualized. No evidence of pulmonic stenosis.  Aorta: The aortic root is normal in size and structure.  Venous: The inferior vena cava is normal in size  with greater than 50% respiratory variability, suggesting right atrial pressure of 3 mmHg.  IAS/Shunts: No atrial level shunt detected by color flow Doppler.   LEFT VENTRICLE PLAX 2D LVIDd:         3.50 cm   Diastology LVIDs:         2.30 cm   LV e' medial:    8.16 cm/s LV PW:         1.00 cm   LV E/e' medial:  8.7 LV IVS:        0.90 cm   LV e' lateral:   8.38 cm/s LVOT diam:     1.90 cm   LV E/e' lateral: 8.4 LV SV:         64 LV SV Index:   44 LVOT Area:     2.84 cm   RIGHT VENTRICLE RV Basal diam:  2.80 cm RV S prime:     10.00 cm/s TAPSE (M-mode): 1.9 cm  LEFT ATRIUM             Index        RIGHT ATRIUM          Index LA diam:        2.80 cm 1.93 cm/m   RA Area:     8.70 cm LA Vol (A2C):   26.2 ml 18.04 ml/m  RA Volume:   15.70 ml 10.81 ml/m LA Vol (A4C):   30.4 ml 20.93 ml/m LA Biplane Vol: 31.3 ml 21.55 ml/m AORTIC VALVE LVOT Vmax:    88.60 cm/s LVOT Vmean:  60.000 cm/s LVOT VTI:    0.225 m  AORTA Ao Root diam: 2.60 cm Ao Asc diam:  2.80 cm  MITRAL VALVE MV Area (PHT): 2.82 cm    SHUNTS MV Decel Time: 269 msec    Systemic VTI:  0.22 m MV E velocity: 70.70 cm/s  Systemic Diam: 1.90 cm MV A velocity: 94.30 cm/s MV E/A ratio:  0.75  Kardie Tobb DO Electronically signed by Thomasene Ripple DO Signature Date/Time: 06/19/2022/2:20:50 PM    Final    MONITORS  CARDIAC EVENT MONITOR 10/04/2019  Narrative 30 Day Event Monitor  Quality: Fair.  Baseline artifact. Predominant rhythm: Sinus rhythm Average heart rate: 89 bpm Max heart rate: 145 bpm Min heart rate: 56 bpm Pauses >2.5 seconds: None  Frequent PVCs and NSVT noted Ventricular bigeminy and trigeminy  Tiffany C. Duke Salvia, MD, [facility] 10/04/2019 1:09 PM           EKG:  EKG is not ordered today.    Recent Labs: 01/21/2022: TSH 0.17 05/21/2022: ALT 18; BUN 12; Creatinine, Ser 0.65; Hemoglobin 14.0; Platelets 285.0; Potassium 4.1; Sodium 141  Recent Lipid Panel    Component Value Date/Time   CHOL 175 05/21/2022 0928   CHOL 180 01/20/2020 0815   TRIG 48.0 05/21/2022 0928   HDL 98.50 05/21/2022 0928   HDL 59 01/20/2020 0815   CHOLHDL 2 05/21/2022 0928   VLDL 9.6 05/21/2022 0928   LDLCALC 67 05/21/2022 0928   LDLCALC 75 07/26/2021 1339   LDLDIRECT 81 04/29/2018 1605     Home Medications   Current Meds  Medication Sig   albuterol (VENTOLIN HFA) 108 (90 Base) MCG/ACT inhaler INHALE 2 PUFFS BY MOUTH EVERY 6 HOURS AS NEEDED FOR WHEEZING OR  SHORTNESS  OF  BREATH   alendronate (FOSAMAX) 70 MG tablet Take one tablet once weekly. Take with a full glass of water on an empty stomach.   aspirin EC 81 MG  tablet Take 1 tablet (81 mg total) by mouth daily.   atorvastatin (LIPITOR) 40 MG tablet Take 1 tablet (40 mg total) by mouth daily.   fluticasone (FLONASE) 50 MCG/ACT nasal spray Place 1 spray into both nostrils daily.   Fluticasone-Umeclidin-Vilant (TRELEGY  ELLIPTA) 100-62.5-25 MCG/ACT AEPB Inhale 1 puff into the lungs daily.   losartan (COZAAR) 50 MG tablet Take 1 tablet (50 mg total) by mouth daily.   metoprolol succinate (TOPROL-XL) 25 MG 24 hr tablet Take 25 mg by mouth as needed.   multivitamin-iron-minerals-folic acid (CENTRUM) chewable tablet Chew 1 tablet by mouth daily.   Omega 3 1000 MG CAPS Take by mouth daily.     Review of Systems      All other systems reviewed and are otherwise negative except as noted above.  Physical Exam    VS:  BP 130/84   Pulse 79   Ht 5\' 3"  (1.6 m)   Wt 105 lb (47.6 kg)   BMI 18.60 kg/m  , BMI Body mass index is 18.6 kg/m.  Wt Readings from Last 3 Encounters:  09/19/22 105 lb (47.6 kg)  05/27/22 102 lb (46.3 kg)  05/27/22 102 lb 12.8 oz (46.6 kg)    GEN: Well nourished, well developed, in no acute distress. HEENT: normal. Neck: Supple, no JVD, carotid bruits, or masses. Cardiac: RRR, no murmurs, rubs, or gallops. No clubbing, cyanosis, edema.  Radials/PT 2+ and equal bilaterally.  Respiratory:  Respirations regular and unlabored, clear to auscultation bilaterally. GI: Soft, nontender, nondistended. MS: No deformity or atrophy. Skin: Warm and dry, no rash. Neuro:  Strength and sensation are intact. Psych: Normal affect.  Assessment & Plan    Coronary calcification on CT - CT 05/2020 two vessel coronary atherosclerosis. Myoview 06/19/22 low risk study.  GDMT includes Aspirin, Atorvastatin. Metoprolol. Heart healthy diet and regular cardiovascular exercise encouraged.    Tobacco use - Smoking cessation encouraged. Recommend utilization of 1800QUITNOW. She is overdue for CT lung cancer screening and encouraged to call and schedule.   HLD - LDL goal <70 given coronary calcification on CT. 05/21/22 LDL 67. Continue Atorvsatatin 40mg  daily.   PSVT / PVC - Quiescent on Toprol 25mg . However, notes it is making her feel drowsy. She has not had palpitations in some time.Marland Kitchen She will take half to whole  tablet as needed for breakthrough palpitations.   HTN - BP reasonably well controlled on Losartan 50mg  QD. Continue same. Appreciate escalation of antihypertensive regimen by her primary care provider.        Disposition: Follow up in 6 month(s) with Chilton Si, MD or APP.  Signed, Alver Sorrow, NP 09/19/2022, 10:09 AM [facility]

## 2022-09-22 ENCOUNTER — Encounter (HOSPITAL_BASED_OUTPATIENT_CLINIC_OR_DEPARTMENT_OTHER): Payer: Self-pay | Admitting: Family

## 2022-10-06 ENCOUNTER — Ambulatory Visit (INDEPENDENT_AMBULATORY_CARE_PROVIDER_SITE_OTHER): Payer: PPO

## 2022-10-06 VITALS — Ht 65.0 in | Wt 109.0 lb

## 2022-10-06 DIAGNOSIS — Z0000 Encounter for general adult medical examination without abnormal findings: Secondary | ICD-10-CM

## 2022-10-06 NOTE — Patient Instructions (Signed)
Ms. Terry Armstrong , Thank you for taking time to come for your Medicare Wellness Visit. I appreciate your ongoing commitment to your health goals. Please review the following plan we discussed and let me know if I can assist you in the future.   These are the goals we discussed:  Goals      Quit smoking / using tobacco     Patient states that she wants to try to quit smoking in the near future.         This is a list of the screening recommended for you and due dates:  Health Maintenance  Topic Date Due   Zoster (Shingles) Vaccine (1 of 2) Never done   Eye exam for diabetics  07/15/2018   Screening for Lung Cancer  05/16/2021   COVID-19 Vaccine (4 - 2023-24 season) 01/03/2022   Complete foot exam   01/18/2022   Colon Cancer Screening  06/11/2022   Hemoglobin A1C  11/19/2022   Flu Shot  12/04/2022   Yearly kidney health urinalysis for diabetes  01/22/2023   Yearly kidney function blood test for diabetes  05/22/2023   Medicare Annual Wellness Visit  10/06/2023   DTaP/Tdap/Td vaccine (3 - Td or Tdap) 06/27/2029   Pneumonia Vaccine  Completed   DEXA scan (bone density measurement)  Completed   Hepatitis C Screening  Completed   HPV Vaccine  Aged Out    Advanced directives: yes  Conditions/risks identified: low falls risk  Next appointment: Follow up in one year for your annual wellness visit 10/07/2023 @11 :00am telephone   Preventive Care 65 Years and Older, Female Preventive care refers to lifestyle choices and visits with your health care provider that can promote health and wellness. What does preventive care include? A yearly physical exam. This is also called an annual well check. Dental exams once or twice a year. Routine eye exams. Ask your health care provider how often you should have your eyes checked. Personal lifestyle choices, including: Daily care of your teeth and gums. Regular physical activity. Eating a healthy diet. Avoiding tobacco and drug use. Limiting  alcohol use. Practicing safe sex. Taking low-dose aspirin every day. Taking vitamin and mineral supplements as recommended by your health care provider. What happens during an annual well check? The services and screenings done by your health care provider during your annual well check will depend on your age, overall health, lifestyle risk factors, and family history of disease. Counseling  Your health care provider may ask you questions about your: Alcohol use. Tobacco use. Drug use. Emotional well-being. Home and relationship well-being. Sexual activity. Eating habits. History of falls. Memory and ability to understand (cognition). Work and work Astronomer. Reproductive health. Screening  You may have the following tests or measurements: Height, weight, and BMI. Blood pressure. Lipid and cholesterol levels. These may be checked every 5 years, or more frequently if you are over 80 years old. Skin check. Lung cancer screening. You may have this screening every year starting at age 71 if you have a 30-pack-year history of smoking and currently smoke or have quit within the past 15 years. Fecal occult blood test (FOBT) of the stool. You may have this test every year starting at age 30. Flexible sigmoidoscopy or colonoscopy. You may have a sigmoidoscopy every 5 years or a colonoscopy every 10 years starting at age 19. Hepatitis C blood test. Hepatitis B blood test. Sexually transmitted disease (STD) testing. Diabetes screening. This is done by checking your blood sugar (glucose) after  you have not eaten for a while (fasting). You may have this done every 1-3 years. Bone density scan. This is done to screen for osteoporosis. You may have this done starting at age 53. Mammogram. This may be done every 1-2 years. Talk to your health care provider about how often you should have regular mammograms. Talk with your health care provider about your test results, treatment options, and if  necessary, the need for more tests. Vaccines  Your health care provider may recommend certain vaccines, such as: Influenza vaccine. This is recommended every year. Tetanus, diphtheria, and acellular pertussis (Tdap, Td) vaccine. You may need a Td booster every 10 years. Zoster vaccine. You may need this after age 57. Pneumococcal 13-valent conjugate (PCV13) vaccine. One dose is recommended after age 85. Pneumococcal polysaccharide (PPSV23) vaccine. One dose is recommended after age 52. Talk to your health care provider about which screenings and vaccines you need and how often you need them. This information is not intended to replace advice given to you by your health care provider. Make sure you discuss any questions you have with your health care provider. Document Released: 05/18/2015 Document Revised: 01/09/2016 Document Reviewed: 02/20/2015 Elsevier Interactive Patient Education  2017 [employer].  Fall Prevention in the Home Falls can cause injuries. They can happen to people of all ages. There are many things you can do to make your home safe and to help prevent falls. What can I do on the outside of my home? Regularly fix the edges of walkways and driveways and fix any cracks. Remove anything that might make you trip as you walk through a door, such as a raised step or threshold. Trim any bushes or trees on the path to your home. Use bright outdoor lighting. Clear any walking paths of anything that might make someone trip, such as rocks or tools. Regularly check to see if handrails are loose or broken. Make sure that both sides of any steps have handrails. Any raised decks and porches should have guardrails on the edges. Have any leaves, snow, or ice cleared regularly. Use sand or salt on walking paths during winter. Clean up any spills in your garage right away. This includes oil or grease spills. What can I do in the bathroom? Use night lights. Install grab bars by the toilet  and in the tub and shower. Do not use towel bars as grab bars. Use non-skid mats or decals in the tub or shower. If you need to sit down in the shower, use a plastic, non-slip stool. Keep the floor dry. Clean up any water that spills on the floor as soon as it happens. Remove soap buildup in the tub or shower regularly. Attach bath mats securely with double-sided non-slip rug tape. Do not have throw rugs and other things on the floor that can make you trip. What can I do in the bedroom? Use night lights. Make sure that you have a light by your bed that is easy to reach. Do not use any sheets or blankets that are too big for your bed. They should not hang down onto the floor. Have a firm chair that has side arms. You can use this for support while you get dressed. Do not have throw rugs and other things on the floor that can make you trip. What can I do in the kitchen? Clean up any spills right away. Avoid walking on wet floors. Keep items that you use a lot in easy-to-reach places. If you  need to reach something above you, use a strong step stool that has a grab bar. Keep electrical cords out of the way. Do not use floor polish or wax that makes floors slippery. If you must use wax, use non-skid floor wax. Do not have throw rugs and other things on the floor that can make you trip. What can I do with my stairs? Do not leave any items on the stairs. Make sure that there are handrails on both sides of the stairs and use them. Fix handrails that are broken or loose. Make sure that handrails are as long as the stairways. Check any carpeting to make sure that it is firmly attached to the stairs. Fix any carpet that is loose or worn. Avoid having throw rugs at the top or bottom of the stairs. If you do have throw rugs, attach them to the floor with carpet tape. Make sure that you have a light switch at the top of the stairs and the bottom of the stairs. If you do not have them, ask someone to add  them for you. What else can I do to help prevent falls? Wear shoes that: Do not have high heels. Have rubber bottoms. Are comfortable and fit you well. Are closed at the toe. Do not wear sandals. If you use a stepladder: Make sure that it is fully opened. Do not climb a closed stepladder. Make sure that both sides of the stepladder are locked into place. Ask someone to hold it for you, if possible. Clearly mark and make sure that you can see: Any grab bars or handrails. First and last steps. Where the edge of each step is. Use tools that help you move around (mobility aids) if they are needed. These include: Canes. Walkers. Scooters. Crutches. Turn on the lights when you go into a dark area. Replace any light bulbs as soon as they burn out. Set up your furniture so you have a clear path. Avoid moving your furniture around. If any of your floors are uneven, fix them. If there are any pets around you, be aware of where they are. Review your medicines with your doctor. Some medicines can make you feel dizzy. This can increase your chance of falling. Ask your doctor what other things that you can do to help prevent falls. This information is not intended to replace advice given to you by your health care provider. Make sure you discuss any questions you have with your health care provider. Document Released: 02/15/2009 Document Revised: 09/27/2015 Document Reviewed: 05/26/2014 Elsevier Interactive Patient Education  2017 [employer].

## 2022-10-06 NOTE — Progress Notes (Signed)
I connected with  Terry Armstrong on 10/06/22 by a audio enabled telemedicine application and verified that I am speaking with the correct person using two identifiers.  Patient Location: Home  Provider Location: Office/Clinic  I discussed the limitations of evaluation and management by telemedicine. The patient expressed understanding and agreed to proceed.  Subjective:   Terry Armstrong is a 77 y.o. female who presents for Medicare Annual (Subsequent) preventive examination.  Review of Systems    Cardiac Risk Factors include: advanced age (>25men, >62 women);dyslipidemia;hypertension    Objective:    Today's Vitals   10/06/22 1059  Weight: 109 lb (49.4 kg)  Height: 5\' 5"  (1.651 m)   Body mass index is 18.14 kg/m.     10/06/2022   11:07 AM 02/17/2022   10:52 AM 07/26/2021   11:28 AM 01/15/2021    4:35 PM 06/19/2020    1:24 PM 09/11/2019    6:50 AM 07/07/2019    1:35 PM  Advanced Directives  Does Patient Have a Medical Advance Directive? Yes No No No No Yes No  Type of Estate agent of [place];Living will     Healthcare Power of Attorney   Would patient like information on creating a medical advance directive?  No - Patient declined No - Patient declined No - Patient declined No - Patient declined  Yes (ED - Information included in AVS)    Current Medications (verified) Outpatient Encounter Medications as of 10/06/2022  Medication Sig   albuterol (VENTOLIN HFA) 108 (90 Base) MCG/ACT inhaler INHALE 2 PUFFS BY MOUTH EVERY 6 HOURS AS NEEDED FOR WHEEZING OR  SHORTNESS  OF  BREATH   alendronate (FOSAMAX) 70 MG tablet Take one tablet once weekly. Take with a full glass of water on an empty stomach.   aspirin EC 81 MG tablet Take 1 tablet (81 mg total) by mouth daily.   atorvastatin (LIPITOR) 40 MG tablet Take 1 tablet (40 mg total) by mouth daily.   fluticasone (FLONASE) 50 MCG/ACT nasal spray Place 1 spray into both nostrils daily.   Fluticasone-Umeclidin-Vilant  (TRELEGY ELLIPTA) 100-62.5-25 MCG/ACT AEPB Inhale 1 puff into the lungs daily.   losartan (COZAAR) 50 MG tablet Take 1 tablet (50 mg total) by mouth daily.   metoprolol succinate (TOPROL-XL) 25 MG 24 hr tablet Take half to whole tablet as needed in the evening for palpitations.   multivitamin-iron-minerals-folic acid (CENTRUM) chewable tablet Chew 1 tablet by mouth daily.   Omega 3 1000 MG CAPS Take by mouth daily.   No facility-administered encounter medications on file as of 10/06/2022.    Allergies (verified) Patient has no known allergies.   History: Past Medical History:  Diagnosis Date   Allergy    Breast abscess    Constipation    if drinks alot of water no issues - uses womens stool softener PRN only    Diabetes mellitus without complication (HCC)    pt has no meds- diet controlled only    Exertional dyspnea 08/18/2019   H/O mammogram 05/06/2019   Per [facility] new patient packet   Hx of adenomatous colonic polyps 06/19/2017   Hypercholesteremia    Per [facility] new patient packet   Hyperlipidemia    Hypertension    Per [facility] new patient packet   Palpitations 08/18/2019   Primary snoring    Thyroid condition    Per [facility] new patient packet   VT (ventricular tachycardia) (HCC) 09/11/2019   Past Surgical History:  Procedure Laterality Date   ABDOMINAL HYSTERECTOMY  1993   COLONOSCOPY     POLYPECTOMY     Family History  Problem Relation Age of Onset   Diabetes Mother    Diabetes Father    Diabetes Other    Hypertension Sister    Hyperlipidemia Other    Heart disease Sister        defibrillator   Kidney failure Maternal Aunt        dialysis   Diabetes Maternal Aunt    Hypertension Maternal Aunt    Diabetes Maternal Aunt    Heart Problems Maternal Grandmother    Cancer Maternal Grandmother    Heart disease Maternal Grandmother    Hypertension Maternal Grandmother    Diabetes Brother    Colon cancer Neg Hx    Colon polyps Neg Hx    Rectal cancer Neg Hx    Stomach cancer Neg  Hx    Social History   Socioeconomic History   Marital status: Married    Spouse name: Not on file   Number of children: 0   Years of education: 14   Highest education level: Not on file  Occupational History    Employer: RETIRED  Tobacco Use   Smoking status: Every Day    Packs/day: 1.50    Years: 49.00    Additional pack years: 0.00    Total pack years: 73.50    Types: Cigarettes   Smokeless tobacco: Never   Tobacco comments:    1/2 ppd 01/16/20  Vaping Use   Vaping Use: Never used  Substance and Sexual Activity   Alcohol use: Yes    Comment: 2-4 per week   Drug use: No   Sexual activity: Not Currently  Other Topics Concern   Not on file  Social History Narrative   Diet       Do you drink/eat things with caffeine Coffee      Marital Status Married What year were you married? 2015      Do you live in a house, apartment, assisted living, condo, trailer, etc.? Private House      Is it one or more stories? Yes 3 stories      How many persons live in your home? 2        Do you have any pets in your home?(please list) No      Highest level of education completed: 2 years college      Current or past profession: [employer]      Do you exercise?: Yes    Type and how often: Daily      Do you have a Living Will? (Form that indicates scenarios where you would not want your life prolonged) No      Do you have a DNR form?         If not, would you like to discuss one? No      Do you have signed POA/HPOA forms? No      Do you have difficulty bathing or dressing yourself? No      Do you have difficulty preparing food or eating? No      Do you have difficulty managing medications? No      Do you have difficulty managing your finances? No      Do you have difficulty affording your medications? No                     Social Determinants of Health   Financial Resource Strain: Low Risk  (10/06/2022)  Overall Financial Resource Strain (CARDIA)     Difficulty of Paying Living Expenses: Not hard at all  Food Insecurity: No Food Insecurity (10/06/2022)   Hunger Vital Sign    Worried About Running Out of Food in the Last Year: Never true    Ran Out of Food in the Last Year: Never true  Transportation Needs: No Transportation Needs (10/06/2022)   PRAPARE - Administrator, Civil Service (Medical): No    Lack of Transportation (Non-Medical): No  Physical Activity: Insufficiently Active (10/06/2022)   Exercise Vital Sign    Days of Exercise per Week: 3 days    Minutes of Exercise per Session: 30 min  Stress: No Stress Concern Present (10/06/2022)   [employer] of Occupational Health - Occupational Stress Questionnaire    Feeling of Stress : Not at all  Social Connections: Moderately Isolated (10/06/2022)   Social Connection and Isolation Panel [NHANES]    Frequency of Communication with Friends and Family: More than three times a week    Frequency of Social Gatherings with Friends and Family: More than three times a week    Attends Religious Services: More than 4 times per year    Active Member of [employer] or Organizations: No    Attends Banker Meetings: Never    Marital Status: Widowed    Tobacco Counseling Ready to quit: Not Answered Counseling given: Not Answered Tobacco comments: 1/2 ppd 01/16/20   Clinical Intake:  Pre-visit preparation completed: Yes  Pain : No/denies pain   BMI - recorded: 18.14 Nutritional Status: BMI <19  Underweight Nutritional Risks: None Diabetes: Yes CBG done?: No Did pt. bring in CBG monitor from home?: No  How often do you need to have someone help you when you read instructions, pamphlets, or other written materials from your doctor or pharmacy?: 1 - Never  Diabetic?yes  Interpreter Needed?: No  Comments: great niece lives with her Information entered by :: B.Al Gagen,LPN   Activities of Daily Living    10/06/2022   11:07 AM 02/17/2022   10:52 AM  In your  present state of health, do you have any difficulty performing the following activities:  Hearing? 0 0  Vision? 0 0  Difficulty concentrating or making decisions? 0 0  Walking or climbing stairs? 0 0  Dressing or bathing? 0 0  Doing errands, shopping? 0 0  Preparing Food and eating ? N N  Using the Toilet? N N  In the past six months, have you accidently leaked urine? N N  Do you have problems with loss of bowel control? N N  Managing your Medications? N N  Managing your Finances? N N  Housekeeping or managing your Housekeeping? N N    Patient Care Team: Georgina Quint, MD as PCP - General (Internal Medicine) Chilton Si, MD as PCP - Cardiology (Cardiology) Ernesto Rutherford, MD as Consulting Physician (Ophthalmology)  Indicate any recent Medical Services you may have received from other than [facility] providers in the past year (date may be approximate).     Assessment:   This is a routine wellness examination for Terry Armstrong.  Hearing/Vision screen Hearing Screening - Comments:: Adequate hearing w/little loss Vision Screening - Comments:: Adequate vision Finding new provider [facility] too far out w/appts  Dietary issues and exercise activities discussed: Current Exercise Habits: Home exercise routine, Type of exercise: walking;Other - see comments (yard work everyday), Time (Minutes): 30, Frequency (Times/Week): 3, Weekly Exercise (Minutes/Week): 90, Intensity: Mild, Exercise limited by: None  identified   Goals Addressed             This Visit's Progress    Quit smoking / using tobacco   Not on track    Patient states that she wants to try to quit smoking in the near future.        Depression Screen    10/06/2022   11:05 AM 05/27/2022    3:48 PM 05/21/2022    8:42 AM 03/04/2022   10:29 AM 02/18/2022   10:52 AM 02/17/2022   10:51 AM 01/21/2022    1:05 PM  PHQ 2/9 Scores  PHQ - 2 Score 0 0 0 0 0 2 5  PHQ- 9 Score       12    Fall Risk    10/06/2022   11:03 AM  05/27/2022    3:48 PM 05/21/2022    8:41 AM 03/04/2022   10:29 AM 02/18/2022   10:52 AM  Fall Risk   Falls in the past year? 0 0 0 0 0  Number falls in past yr: 0 0 0 0 0  Injury with Fall? 0 0 0 0 0  Risk for fall due to : No Fall Risks Impaired vision No Fall Risks No Fall Risks No Fall Risks  Follow up Falls prevention discussed;Education provided Falls evaluation completed Falls evaluation completed Follow up appointment Falls evaluation completed    FALL RISK PREVENTION PERTAINING TO THE HOME:  Any stairs in or around the home? Yes  If so, are there any without handrails? Yes  Home free of loose throw rugs in walkways, pet beds, electrical cords, etc? Yes  Adequate lighting in your home to reduce risk of falls? Yes   ASSISTIVE DEVICES UTILIZED TO PREVENT FALLS:  Life alert? No  Use of a cane, walker or w/c? No  Grab bars in the bathroom? Yes  Shower chair or bench in shower? Yes does not use Elevated toilet seat or a handicapped toilet? Yes          10/06/2022   11:09 AM 02/17/2022   10:53 AM 01/15/2021    4:34 PM 07/07/2019    1:36 PM 03/03/2017    9:39 AM  6CIT Screen  What Year? 0 points 0 points 0 points 0 points 0 points  What month? 0 points 0 points 0 points 0 points 0 points  What time? 0 points 0 points 0 points 0 points 0 points  Count back from 20 0 points 0 points 0 points 0 points 0 points  Months in reverse 0 points 0 points 0 points 0 points 0 points  Repeat phrase 0 points 0 points 0 points 0 points 2 points  Total Score 0 points 0 points 0 points 0 points 2 points    Immunizations Immunization History  Administered Date(s) Administered   Fluad Quad(high Dose 65+) 02/15/2019, 01/21/2022   Influenza Split 01/28/2012   Influenza, High Dose Seasonal PF 06/20/2016, 02/10/2018   Influenza,inj,Quad PF,6+ Mos 04/08/2013, 04/15/2014, 01/11/2015, 03/03/2017   Moderna Sars-Covid-2 Vaccination 06/16/2019, 07/19/2019, 04/12/2020   Pneumococcal Conjugate-13  07/26/2015   Pneumococcal Polysaccharide-23 11/14/2010   Td 06/28/2019   Tdap 10/11/2008   Zoster, Live 05/05/2014    TDAP status: Up to date  Flu Vaccine status: Up to date  Pneumococcal vaccine status: Up to date  Covid-19 vaccine status: Completed vaccines  Qualifies for Shingles Vaccine? Yes   Zostavax completed No   Shingrix Completed?: No.    Education has been provided regarding the  importance of this vaccine. Patient has been advised to call insurance company to determine out of pocket expense if they have not yet received this vaccine. Advised may also receive vaccine at local pharmacy or Health Dept. Verbalized acceptance and understanding.  Screening Tests Health Maintenance  Topic Date Due   Zoster Vaccines- Shingrix (1 of 2) Never done   OPHTHALMOLOGY EXAM  07/15/2018   Lung Cancer Screening  05/16/2021   COVID-19 Vaccine (4 - 2023-24 season) 01/03/2022   FOOT EXAM  01/18/2022   Colonoscopy  06/11/2022   HEMOGLOBIN A1C  11/19/2022   INFLUENZA VACCINE  12/04/2022   Diabetic kidney evaluation - Urine ACR  01/22/2023   Diabetic kidney evaluation - eGFR measurement  05/22/2023   Medicare Annual Wellness (AWV)  10/06/2023   DTaP/Tdap/Td (3 - Td or Tdap) 06/27/2029   Pneumonia Vaccine 78+ Years old  Completed   DEXA SCAN  Completed   Hepatitis C Screening  Completed   HPV VACCINES  Aged Out    Health Maintenance  Health Maintenance Due  Topic Date Due   Zoster Vaccines- Shingrix (1 of 2) Never done   OPHTHALMOLOGY EXAM  07/15/2018   Lung Cancer Screening  05/16/2021   COVID-19 Vaccine (4 - 2023-24 season) 01/03/2022   FOOT EXAM  01/18/2022   Colonoscopy  06/11/2022    Colorectal cancer screening: No longer required.   Mammogram status: No longer required due to age.  Bone Density status: Completed yes. Results reflect: Bone density results: OSTEOPOROSIS. Repeat every 5 years.  Lung Cancer Screening: (Low Dose CT Chest recommended if Age 71-80 years, 30  pack-year currently smoking OR have quit w/in 15years.) does qualify.   Lung Cancer Screening Referral: no pt scan  Additional Screening:  Hepatitis C Screening: does not qualify; Completed yes  Vision Screening: Recommended annual ophthalmology exams for early detection of glaucoma and other disorders of the eye. Is the patient up to date with their annual eye exam?  No  Who is the provider or what is the name of the office in which the patient attends annual eye exams? New provider on [facility] in [place].no longer with [facility] If pt is not established with a provider, would they like to be referred to a provider to establish care? No .   Dental Screening: Recommended annual dental exams for proper oral hygiene  Community Resource Referral / Chronic Care Management: CRR required this visit?  No   CCM required this visit?  No    Plan:     I have personally reviewed and noted the following in the patient's chart:   Medical and social history Use of alcohol, tobacco or illicit drugs  Current medications and supplements including opioid prescriptions. Patient is not currently taking opioid prescriptions. Functional ability and status Nutritional status Physical activity Advanced directives List of other physicians Hospitalizations, surgeries, and ER visits in previous 12 months Vitals Screenings to include cognitive, depression, and falls Referrals and appointments  In addition, I have reviewed and discussed with patient certain preventive protocols, quality metrics, and best practice recommendations. A written personalized care plan for preventive services as well as general preventive health recommendations were provided to patient.     Sue Lush, LPN   [id number]/01/8118   Nurse Notes: The patient states she is recently widowed (sept 2023) and is doing alright most days. She has no concerns or questions at this time.

## 2022-11-19 ENCOUNTER — Ambulatory Visit: Payer: PPO | Admitting: Emergency Medicine

## 2022-11-25 ENCOUNTER — Ambulatory Visit (INDEPENDENT_AMBULATORY_CARE_PROVIDER_SITE_OTHER): Payer: PPO | Admitting: Emergency Medicine

## 2022-11-25 ENCOUNTER — Encounter: Payer: Self-pay | Admitting: Emergency Medicine

## 2022-11-25 VITALS — BP 122/68 | HR 65 | Temp 97.9°F | Ht 65.0 in | Wt 102.4 lb

## 2022-11-25 DIAGNOSIS — I700 Atherosclerosis of aorta: Secondary | ICD-10-CM

## 2022-11-25 DIAGNOSIS — I10 Essential (primary) hypertension: Secondary | ICD-10-CM | POA: Diagnosis not present

## 2022-11-25 DIAGNOSIS — F17200 Nicotine dependence, unspecified, uncomplicated: Secondary | ICD-10-CM

## 2022-11-25 DIAGNOSIS — J432 Centrilobular emphysema: Secondary | ICD-10-CM | POA: Diagnosis not present

## 2022-11-25 LAB — CBC WITH DIFFERENTIAL/PLATELET
Basophils Absolute: 0.1 10*3/uL (ref 0.0–0.1)
Basophils Relative: 0.7 % (ref 0.0–3.0)
Eosinophils Absolute: 0.1 10*3/uL (ref 0.0–0.7)
Eosinophils Relative: 1.6 % (ref 0.0–5.0)
HCT: 43 % (ref 36.0–46.0)
Hemoglobin: 14.2 g/dL (ref 12.0–15.0)
Lymphocytes Relative: 19.8 % (ref 12.0–46.0)
Lymphs Abs: 1.4 10*3/uL (ref 0.7–4.0)
MCHC: 33.1 g/dL (ref 30.0–36.0)
MCV: 99.6 fl (ref 78.0–100.0)
Monocytes Absolute: 0.6 10*3/uL (ref 0.1–1.0)
Monocytes Relative: 8.7 % (ref 3.0–12.0)
Neutro Abs: 5.1 10*3/uL (ref 1.4–7.7)
Neutrophils Relative %: 69.2 % (ref 43.0–77.0)
Platelets: 264 10*3/uL (ref 150.0–400.0)
RBC: 4.31 Mil/uL (ref 3.87–5.11)
RDW: 14.8 % (ref 11.5–15.5)
WBC: 7.3 10*3/uL (ref 4.0–10.5)

## 2022-11-25 LAB — COMPREHENSIVE METABOLIC PANEL
ALT: 16 U/L (ref 0–35)
AST: 20 U/L (ref 0–37)
Albumin: 4.3 g/dL (ref 3.5–5.2)
Alkaline Phosphatase: 65 U/L (ref 39–117)
BUN: 10 mg/dL (ref 6–23)
CO2: 28 mEq/L (ref 19–32)
Calcium: 9.4 mg/dL (ref 8.4–10.5)
Chloride: 104 mEq/L (ref 96–112)
Creatinine, Ser: 0.65 mg/dL (ref 0.40–1.20)
GFR: 84.76 mL/min (ref >60.00)
Glucose, Bld: 78 mg/dL (ref 70–99)
Potassium: 4.2 mEq/L (ref 3.5–5.1)
Sodium: 139 mEq/L (ref 135–145)
Total Bilirubin: 0.7 mg/dL (ref 0.2–1.2)
Total Protein: 7 g/dL (ref 6.0–8.3)

## 2022-11-25 LAB — LIPID PANEL
Cholesterol: 167 mg/dL (ref 0–200)
HDL: 83.2 mg/dL (ref >39.00)
LDL Cholesterol: 69 mg/dL (ref 0–99)
NonHDL: 84.25
Total CHOL/HDL Ratio: 2
Triglycerides: 75 mg/dL (ref 0.0–149.0)
VLDL: 15 mg/dL (ref 0.0–40.0)

## 2022-11-25 LAB — HEMOGLOBIN A1C: Hgb A1c MFr Bld: 6 % (ref 4.6–6.5)

## 2022-11-25 NOTE — Assessment & Plan Note (Signed)
Stable.  Continues to smoke. Continue daily Trelegy

## 2022-11-25 NOTE — Progress Notes (Signed)
Terry Armstrong 77 y.o.   Chief Complaint  Patient presents with   Medical Management of Chronic Issues    f/u appt     HISTORY OF PRESENT ILLNESS: This is a 77 y.o. female here for follow-up of chronic medical conditions including hypertension Still smoking. Overall doing well.  Has no complaints or medical concerns today.  HPI   Prior to Admission medications   Medication Sig Start Date End Date Taking? Authorizing Provider  albuterol (VENTOLIN HFA) 108 (90 Base) MCG/ACT inhaler INHALE 2 PUFFS BY MOUTH EVERY 6 HOURS AS NEEDED FOR WHEEZING OR  SHORTNESS  OF  BREATH 07/31/22  Yes Singleton Hickox, Eilleen Kempf, MD  alendronate (FOSAMAX) 70 MG tablet Take one tablet once weekly. Take with a full glass of water on an empty stomach. 06/19/20  Yes Ngetich, Dinah C, NP  aspirin EC 81 MG tablet Take 1 tablet (81 mg total) by mouth daily. 06/28/19  Yes Collie Siad A, MD  atorvastatin (LIPITOR) 40 MG tablet Take 1 tablet (40 mg total) by mouth daily. 02/18/22  Yes Bora Bost, Eilleen Kempf, MD  fluticasone [facility]) 50 MCG/ACT nasal spray Place 1 spray into both nostrils daily. 01/16/22  Yes Mound, Rolly Salter E, FNP  Fluticasone-Umeclidin-Vilant (TRELEGY ELLIPTA) 100-62.5-25 MCG/ACT AEPB Inhale 1 puff into the lungs daily. 01/21/22  Yes Berline Semrad, Eilleen Kempf, MD  losartan (COZAAR) 50 MG tablet Take 1 tablet (50 mg total) by mouth daily. 05/21/22  Yes Milla Wahlberg, Eilleen Kempf, MD  metoprolol succinate (TOPROL-XL) 25 MG 24 hr tablet Take half to whole tablet as needed in the evening for palpitations. 09/19/22  Yes Alver Sorrow, NP  multivitamin-iron-minerals-folic acid (CENTRUM) chewable tablet Chew 1 tablet by mouth daily.   Yes [provider]  Omega 3 1000 MG CAPS Take by mouth daily.   Yes [provider]    No Known Allergies  Patient Active Problem List   Diagnosis Date Noted   Dysuria 05/27/2022   Acute UTI 05/27/2022   Situational mixed anxiety and depressive disorder 01/21/2022    Aortic atherosclerosis (HCC) 06/19/2020   Hx of adenomatous colonic polyps 06/19/2017   Multinodular goiter 03/15/2017   Centrilobular emphysema (HCC) 11/09/2016   Periodic limb movement sleep disorder 01/11/2015   Abnormal TSH 01/11/2015   Upper airway resistance syndrome 01/11/2015   Mild obstructive sleep apnea 01/11/2015   Osteoporosis 01/10/2015   Non-restorative sleep 05/23/2013   Primary snoring    HTN (hypertension) 05/11/2013   DM (diabetes mellitus) (HCC) 06/04/2011   Tobacco user 06/04/2011   Hyperlipidemia LDL goal <70 06/04/2011    Past Medical History:  Diagnosis Date   Allergy    Breast abscess    Constipation    if drinks alot of water no issues - uses womens stool softener PRN only    Diabetes mellitus without complication (HCC)    pt has no meds- diet controlled only    Exertional dyspnea 08/18/2019   H/O mammogram 05/06/2019   Per [facility] new patient packet   Hx of adenomatous colonic polyps 06/19/2017   Hypercholesteremia    Per [facility] new patient packet   Hyperlipidemia    Hypertension    Per [facility] new patient packet   Palpitations 08/18/2019   Primary snoring    Thyroid condition    Per [facility] new patient packet   VT (ventricular tachycardia) (HCC) 09/11/2019    Past Surgical History:  Procedure Laterality Date   ABDOMINAL HYSTERECTOMY  1993   COLONOSCOPY     POLYPECTOMY  Social History   Socioeconomic History   Marital status: Married    Spouse name: Not on file   Number of children: 0   Years of education: 14   Highest education level: Not on file  Occupational History    Employer: RETIRED  Tobacco Use   Smoking status: Every Day    Current packs/day: 1.50    Average packs/day: 1.5 packs/day for 49.0 years (73.5 ttl pk-yrs)    Types: Cigarettes   Smokeless tobacco: Never   Tobacco comments:    1/2 ppd 01/16/20  Vaping Use   Vaping status: Never Used  Substance and Sexual Activity   Alcohol use: Yes    Comment: 2-4 per week   Drug  use: No   Sexual activity: Not Currently  Other Topics Concern   Not on file  Social History Narrative   Diet       Do you drink/eat things with caffeine Coffee      Marital Status Married What year were you married? 2015      Do you live in a house, apartment, assisted living, condo, trailer, etc.? Private House      Is it one or more stories? Yes 3 stories      How many persons live in your home? 2        Do you have any pets in your home?(please list) No      Highest level of education completed: 2 years college      Current or past profession: [employer]      Do you exercise?: Yes    Type and how often: Daily      Do you have a Living Will? (Form that indicates scenarios where you would not want your life prolonged) No      Do you have a DNR form?         If not, would you like to discuss one? No      Do you have signed POA/HPOA forms? No      Do you have difficulty bathing or dressing yourself? No      Do you have difficulty preparing food or eating? No      Do you have difficulty managing medications? No      Do you have difficulty managing your finances? No      Do you have difficulty affording your medications? No                     Social Determinants of Health   Financial Resource Strain: Low Risk  (10/06/2022)   Overall Financial Resource Strain (CARDIA)    Difficulty of Paying Living Expenses: Not hard at all  Food Insecurity: No Food Insecurity (10/06/2022)   Hunger Vital Sign    Worried About Running Out of Food in the Last Year: Never true    Ran Out of Food in the Last Year: Never true  Transportation Needs: No Transportation Needs (10/06/2022)   PRAPARE - Administrator, Civil Service (Medical): No    Lack of Transportation (Non-Medical): No  Physical Activity: Insufficiently Active (10/06/2022)   Exercise Vital Sign    Days of Exercise per Week: 3 days    Minutes of Exercise per Session: 30 min  Stress: No Stress Concern  Present (10/06/2022)   [employer] of Occupational Health - Occupational Stress Questionnaire    Feeling of Stress : Not at all  Social Connections: Moderately Isolated (10/06/2022)   Social Connection  and Isolation Panel [NHANES]    Frequency of Communication with Friends and Family: More than three times a week    Frequency of Social Gatherings with Friends and Family: More than three times a week    Attends Religious Services: More than 4 times per year    Active Member of [employer] or Organizations: No    Attends Banker Meetings: Never    Marital Status: Widowed  Intimate Partner Violence: Not At Risk (10/06/2022)   Humiliation, Afraid, Rape, and Kick questionnaire    Fear of Current or Ex-Partner: No    Emotionally Abused: No    Physically Abused: No    Sexually Abused: No    Family History  Problem Relation Age of Onset   Diabetes Mother    Diabetes Father    Diabetes Other    Hypertension Sister    Hyperlipidemia Other    Heart disease Sister        defibrillator   Kidney failure Maternal Aunt        dialysis   Diabetes Maternal Aunt    Hypertension Maternal Aunt    Diabetes Maternal Aunt    Heart Problems Maternal Grandmother    Cancer Maternal Grandmother    Heart disease Maternal Grandmother    Hypertension Maternal Grandmother    Diabetes Brother    Colon cancer Neg Hx    Colon polyps Neg Hx    Rectal cancer Neg Hx    Stomach cancer Neg Hx      Review of Systems  Constitutional: Negative.  Negative for chills and fever.  HENT: Negative.  Negative for congestion and sore throat.   Respiratory: Negative.  Negative for cough and shortness of breath.   Cardiovascular: Negative.  Negative for chest pain and palpitations.  Gastrointestinal:  Negative for abdominal pain, diarrhea, nausea and vomiting.  Genitourinary: Negative.  Negative for dysuria and hematuria.  Skin: Negative.  Negative for rash.  Neurological: Negative.  Negative for dizziness  and headaches.  All other systems reviewed and are negative.   Vitals:   11/25/22 0951  BP: 122/68  Pulse: 65  Temp: 97.9 F (36.6 C)  SpO2: 98%    Physical Exam Vitals reviewed.  Constitutional:      Appearance: Normal appearance.  HENT:     Head: Normocephalic.     Mouth/Throat:     Mouth: Mucous membranes are moist.     Pharynx: Oropharynx is clear.  Eyes:     Extraocular Movements: Extraocular movements intact.     Conjunctiva/sclera: Conjunctivae normal.     Pupils: Pupils are equal, round, and reactive to light.  Cardiovascular:     Rate and Rhythm: Normal rate and regular rhythm.     Pulses: Normal pulses.     Heart sounds: Normal heart sounds.  Pulmonary:     Effort: Pulmonary effort is normal.     Breath sounds: Normal breath sounds.  Musculoskeletal:     Cervical back: No tenderness.  Lymphadenopathy:     Cervical: No cervical adenopathy.  Skin:    General: Skin is warm and dry.     Capillary Refill: Capillary refill takes less than 2 seconds.  Neurological:     General: No focal deficit present.     Mental Status: She is alert and oriented to person, place, and time.  Psychiatric:        Mood and Affect: Mood normal.        Behavior: Behavior normal.  ASSESSMENT & PLAN: A total of 47 minutes was spent with the patient and counseling/coordination of care regarding preparing for this visit, review of most recent office visit notes, review of multiple chronic medical conditions under management, review of all medications, review of most recent blood work results, education on nutrition, smoking cessation advice, prognosis, documentation and need for follow-up.  Problem List Items Addressed This Visit       Cardiovascular and Mediastinum   HTN (hypertension) - Primary    BP Readings from Last 3 Encounters:  11/25/22 122/68  09/19/22 130/84  06/16/22 125/68  Well-controlled hypertension Continue losartan 50 mg daily and metoprolol succinate 25  mg daily Cardiovascular risk associated with hypertension discussed. Dietary approaches to stop hypertension discussed       Relevant Orders   CBC with Differential/Platelet   Comprehensive metabolic panel   Hemoglobin A1c   Lipid panel   Aortic atherosclerosis (HCC)    Diet and nutrition discussed Continue atorvastatin 40 mg daily        Respiratory   Centrilobular emphysema (HCC)    Stable.  Continues to smoke. Continue daily Trelegy        Other   Current smoker    Cardiovascular and cancer risks associated with smoking discussed Smoking cessation advice given      Relevant Orders   Ambulatory Referral for Lung Cancer Scre   Patient Instructions  Health Maintenance After Age 84 After age 13, you are at a higher risk for certain long-term diseases and infections as well as injuries from falls. Falls are a major cause of broken bones and head injuries in people who are older than age 67. Getting regular preventive care can help to keep you healthy and well. Preventive care includes getting regular testing and making lifestyle changes as recommended by your health care provider. Talk with your health care provider about: Which screenings and tests you should have. A screening is a test that checks for a disease when you have no symptoms. A diet and exercise plan that is right for you. What should I know about screenings and tests to prevent falls? Screening and testing are the best ways to find a health problem early. Early diagnosis and treatment give you the best chance of managing medical conditions that are common after age 33. Certain conditions and lifestyle choices may make you more likely to have a fall. Your health care provider may recommend: Regular vision checks. Poor vision and conditions such as cataracts can make you more likely to have a fall. If you wear glasses, make sure to get your prescription updated if your vision changes. Medicine review. Work with  your health care provider to regularly review all of the medicines you are taking, including over-the-counter medicines. Ask your health care provider about any side effects that may make you more likely to have a fall. Tell your health care provider if any medicines that you take make you feel dizzy or sleepy. Strength and balance checks. Your health care provider may recommend certain tests to check your strength and balance while standing, walking, or changing positions. Foot health exam. Foot pain and numbness, as well as not wearing proper footwear, can make you more likely to have a fall. Screenings, including: Osteoporosis screening. Osteoporosis is a condition that causes the bones to get weaker and break more easily. Blood pressure screening. Blood pressure changes and medicines to control blood pressure can make you feel dizzy. Depression screening. You may be more likely to  have a fall if you have a fear of falling, feel depressed, or feel unable to do activities that you used to do. Alcohol use screening. Using too much alcohol can affect your balance and may make you more likely to have a fall. Follow these instructions at home: Lifestyle Do not drink alcohol if: Your health care provider tells you not to drink. If you drink alcohol: Limit how much you have to: 0-1 drink a day for women. 0-2 drinks a day for men. Know how much alcohol is in your drink. In the [place] one drink equals one 12 oz bottle of beer (355 mL), one 5 oz glass of wine (148 mL), or one 1 oz glass of hard liquor (44 mL). Do not use any products that contain nicotine or tobacco. These products include cigarettes, chewing tobacco, and vaping devices, such as e-cigarettes. If you need help quitting, ask your health care provider. Activity  Follow a regular exercise program to stay fit. This will help you maintain your balance. Ask your health care provider what types of exercise are appropriate for you. If you need  a cane or walker, use it as recommended by your health care provider. Wear supportive shoes that have nonskid soles. Safety  Remove any tripping hazards, such as rugs, cords, and clutter. Install safety equipment such as grab bars in bathrooms and safety rails on stairs. Keep rooms and walkways well-lit. General instructions Talk with your health care provider about your risks for falling. Tell your health care provider if: You fall. Be sure to tell your health care provider about all falls, even ones that seem minor. You feel dizzy, tiredness (fatigue), or off-balance. Take over-the-counter and prescription medicines only as told by your health care provider. These include supplements. Eat a healthy diet and maintain a healthy weight. A healthy diet includes low-fat dairy products, low-fat (lean) meats, and fiber from whole grains, beans, and lots of fruits and vegetables. Stay current with your vaccines. Schedule regular health, dental, and eye exams. Summary Having a healthy lifestyle and getting preventive care can help to protect your health and wellness after age 70. Screening and testing are the best way to find a health problem early and help you avoid having a fall. Early diagnosis and treatment give you the best chance for managing medical conditions that are more common for people who are older than age 39. Falls are a major cause of broken bones and head injuries in people who are older than age 66. Take precautions to prevent a fall at home. Work with your health care provider to learn what changes you can make to improve your health and wellness and to prevent falls. This information is not intended to replace advice given to you by your health care provider. Make sure you discuss any questions you have with your health care provider. Document Revised: 09/10/2020 Document Reviewed: 09/10/2020 Elsevier Patient Education  2024 Elsevier Inc.      Edwina Barth, MD [place]  Primary Care at [facility]

## 2022-11-25 NOTE — Assessment & Plan Note (Signed)
BP Readings from Last 3 Encounters:  11/25/22 122/68  09/19/22 130/84  06/16/22 125/68  Well-controlled hypertension Continue losartan 50 mg daily and metoprolol succinate 25 mg daily Cardiovascular risk associated with hypertension discussed. Dietary approaches to stop hypertension discussed

## 2022-11-25 NOTE — Addendum Note (Signed)
Addended by: Evie Lacks on: 11/25/2022 05:05 PM   Modules accepted: Level of Service

## 2022-11-25 NOTE — Patient Instructions (Signed)
Health Maintenance After Age 78 After age 78, you are at a higher risk for certain long-term diseases and infections as well as injuries from falls. Falls are a major cause of broken bones and head injuries in people who are older than age 78. Getting regular preventive care can help to keep you healthy and well. Preventive care includes getting regular testing and making lifestyle changes as recommended by your health care provider. Talk with your health care provider about: Which screenings and tests you should have. A screening is a test that checks for a disease when you have no symptoms. A diet and exercise plan that is right for you. What should I know about screenings and tests to prevent falls? Screening and testing are the best ways to find a health problem early. Early diagnosis and treatment give you the best chance of managing medical conditions that are common after age 78. Certain conditions and lifestyle choices may make you more likely to have a fall. Your health care provider may recommend: Regular vision checks. Poor vision and conditions such as cataracts can make you more likely to have a fall. If you wear glasses, make sure to get your prescription updated if your vision changes. Medicine review. Work with your health care provider to regularly review all of the medicines you are taking, including over-the-counter medicines. Ask your health care provider about any side effects that may make you more likely to have a fall. Tell your health care provider if any medicines that you take make you feel dizzy or sleepy. Strength and balance checks. Your health care provider may recommend certain tests to check your strength and balance while standing, walking, or changing positions. Foot health exam. Foot pain and numbness, as well as not wearing proper footwear, can make you more likely to have a fall. Screenings, including: Osteoporosis screening. Osteoporosis is a condition that causes  the bones to get weaker and break more easily. Blood pressure screening. Blood pressure changes and medicines to control blood pressure can make you feel dizzy. Depression screening. You may be more likely to have a fall if you have a fear of falling, feel depressed, or feel unable to do activities that you used to do. Alcohol use screening. Using too much alcohol can affect your balance and may make you more likely to have a fall. Follow these instructions at home: Lifestyle Do not drink alcohol if: Your health care provider tells you not to drink. If you drink alcohol: Limit how much you have to: 0-1 drink a day for women. 0-2 drinks a day for men. Know how much alcohol is in your drink. In the [place] one drink equals one 12 oz bottle of beer (355 mL), one 5 oz glass of wine (148 mL), or one 1 oz glass of hard liquor (44 mL). Do not use any products that contain nicotine or tobacco. These products include cigarettes, chewing tobacco, and vaping devices, such as e-cigarettes. If you need help quitting, ask your health care provider. Activity  Follow a regular exercise program to stay fit. This will help you maintain your balance. Ask your health care provider what types of exercise are appropriate for you. If you need a cane or walker, use it as recommended by your health care provider. Wear supportive shoes that have nonskid soles. Safety  Remove any tripping hazards, such as rugs, cords, and clutter. Install safety equipment such as grab bars in bathrooms and safety rails on stairs. Keep rooms and walkways   well-lit. General instructions Talk with your health care provider about your risks for falling. Tell your health care provider if: You fall. Be sure to tell your health care provider about all falls, even ones that seem minor. You feel dizzy, tiredness (fatigue), or off-balance. Take over-the-counter and prescription medicines only as told by your health care provider. These include  supplements. Eat a healthy diet and maintain a healthy weight. A healthy diet includes low-fat dairy products, low-fat (lean) meats, and fiber from whole grains, beans, and lots of fruits and vegetables. Stay current with your vaccines. Schedule regular health, dental, and eye exams. Summary Having a healthy lifestyle and getting preventive care can help to protect your health and wellness after age 78. Screening and testing are the best way to find a health problem early and help you avoid having a fall. Early diagnosis and treatment give you the best chance for managing medical conditions that are more common for people who are older than age 78. Falls are a major cause of broken bones and head injuries in people who are older than age 78. Take precautions to prevent a fall at home. Work with your health care provider to learn what changes you can make to improve your health and wellness and to prevent falls. This information is not intended to replace advice given to you by your health care provider. Make sure you discuss any questions you have with your health care provider. Document Revised: 09/10/2020 Document Reviewed: 09/10/2020 Elsevier Patient Education  2024 Elsevier Inc.  

## 2022-11-25 NOTE — Assessment & Plan Note (Signed)
Cardiovascular and cancer risks associated with smoking discussed. °Smoking cessation advice given. °

## 2022-11-25 NOTE — Assessment & Plan Note (Signed)
Diet and nutrition discussed. Continue atorvastatin 40 mg daily. 

## 2022-11-29 IMAGING — CT CT CHEST LUNG CANCER SCREENING LOW DOSE W/O CM
2 of 4 series · 15 of 36 positions shown, 18 images · non-contrast
Comparison: 03/23/2019 screening chest CT.

CLINICAL DATA: 74-year-old asymptomatic female current smoker with
37 pack-year smoking history.

EXAM:
CT CHEST WITHOUT CONTRAST LOW-DOSE FOR LUNG CANCER SCREENING
TECHNIQUE: Multidetector CT imaging of the chest was performed following the
standard protocol without IV contrast.

[Series 3: lung thins 1.0 · axial · 0.60mm/px · z∈[-304,-44]mm · 12 of 286 slices shown, 15 images]
[im 13/286  mediastinal]
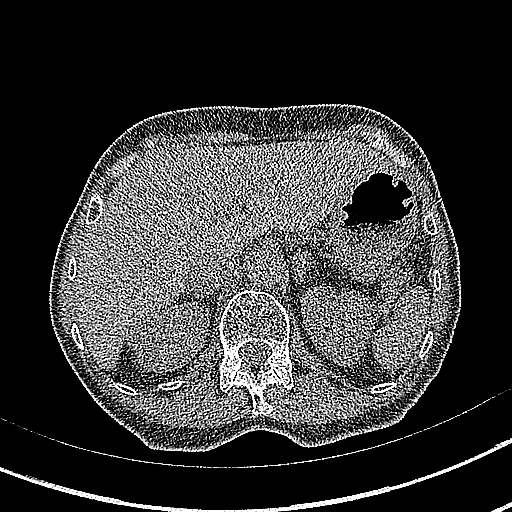
[im 13/286  lung]
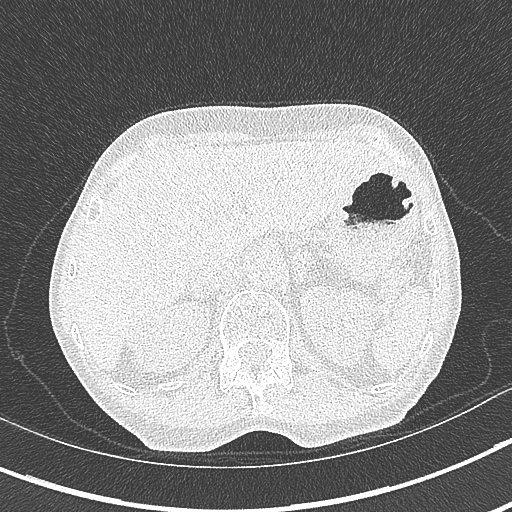
[im 39/286  lung]
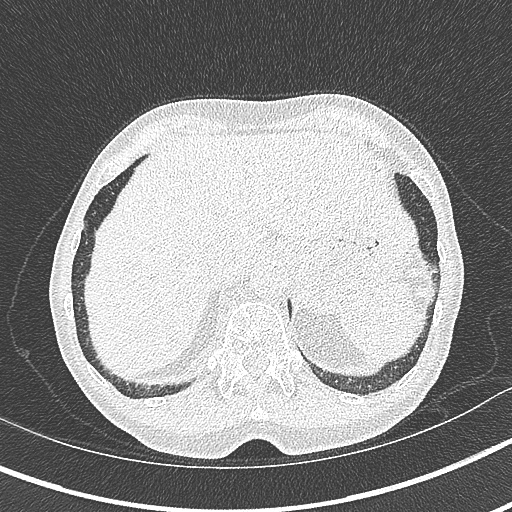
[im 65/286  lung]
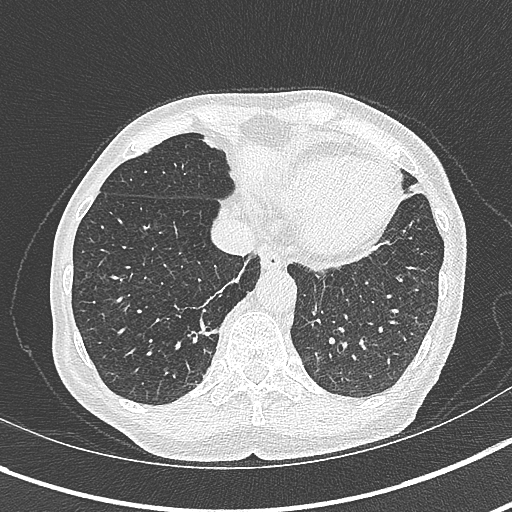
[im 91/286  lung]
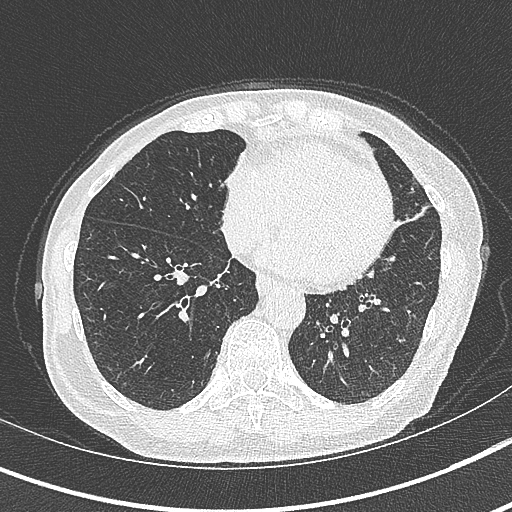
[im 104/286  mediastinal]
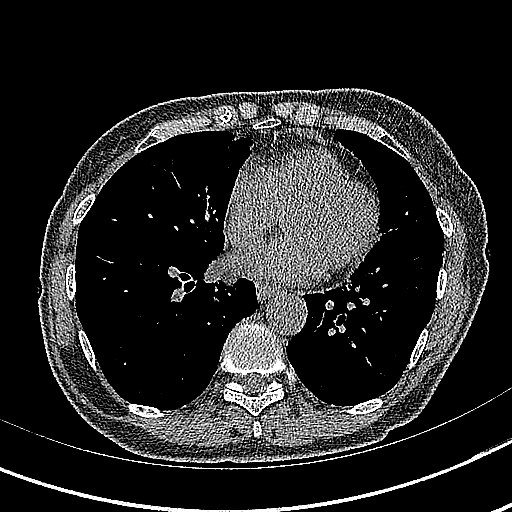
[im 104/286  lung]
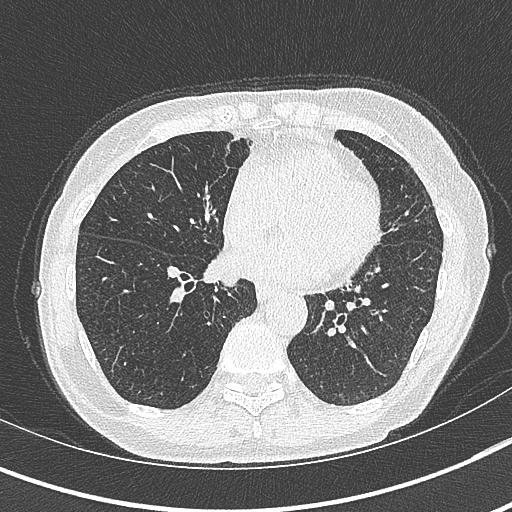
[im 130/286  lung]
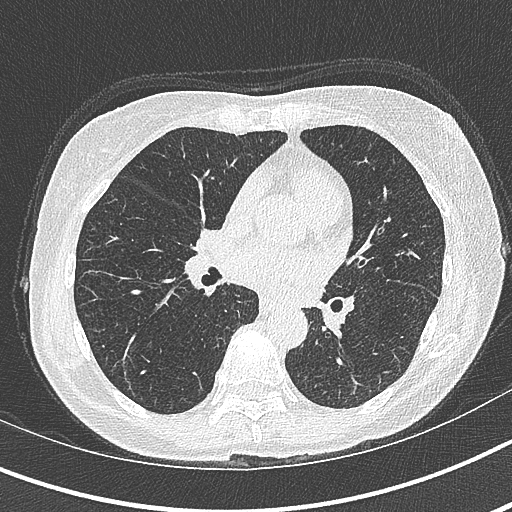
[im 156/286  lung]
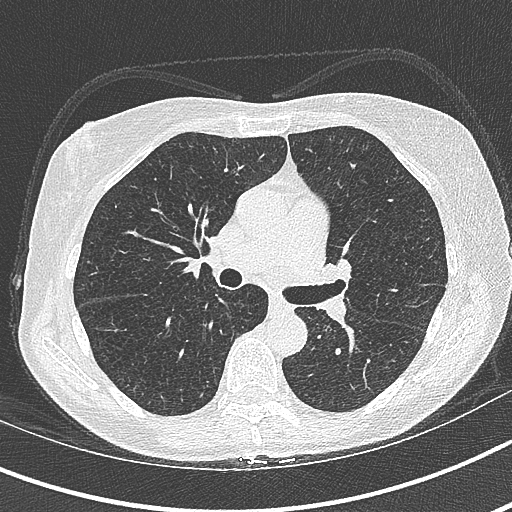
[im 182/286  lung]
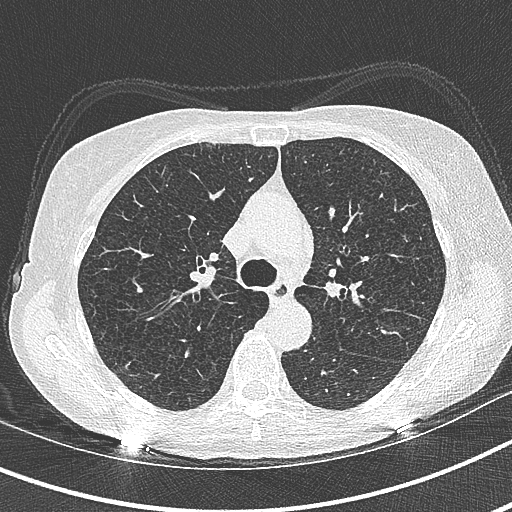
[im 195/286  mediastinal]
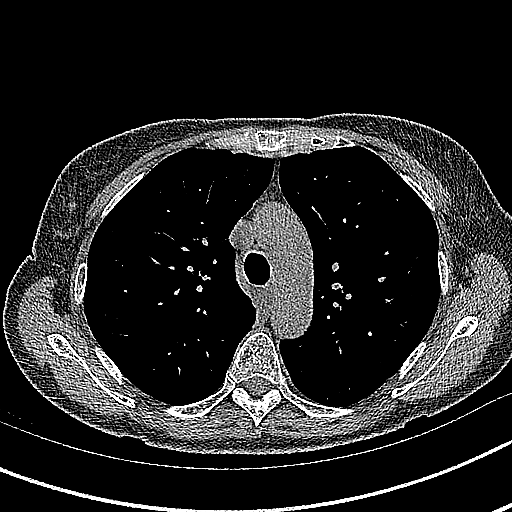
[im 195/286  lung]
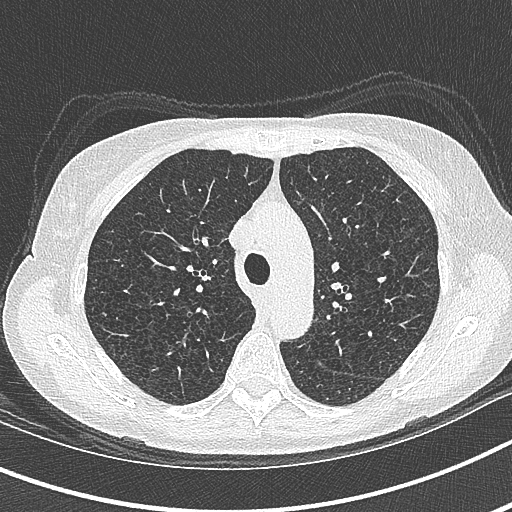
[im 221/286  lung]
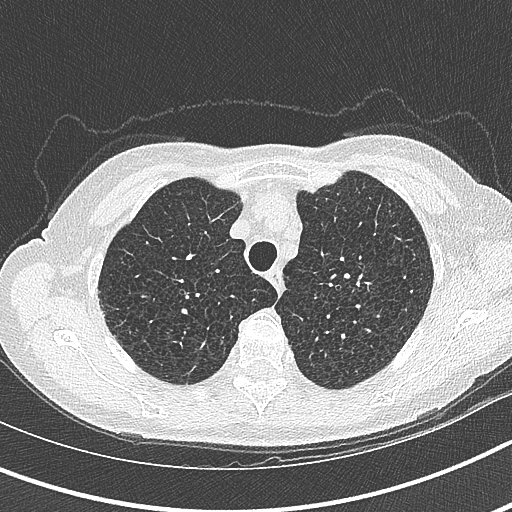
[im 247/286  lung]
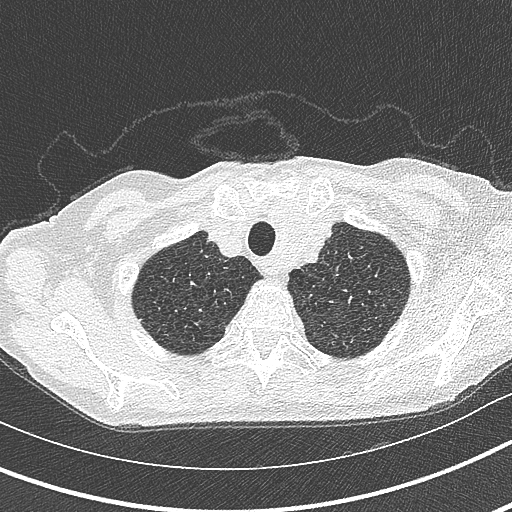
[im 273/286  lung]
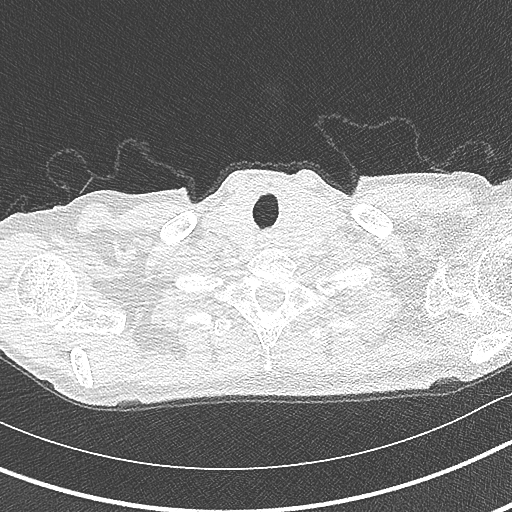

[Series 5: coronal · coronal · 0.52mm/px · 3 of 114 slices shown]
[im 23/114  lung]
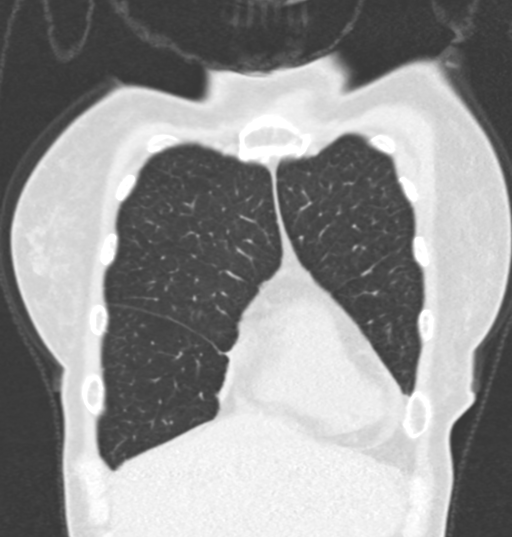
[im 46/114  lung]
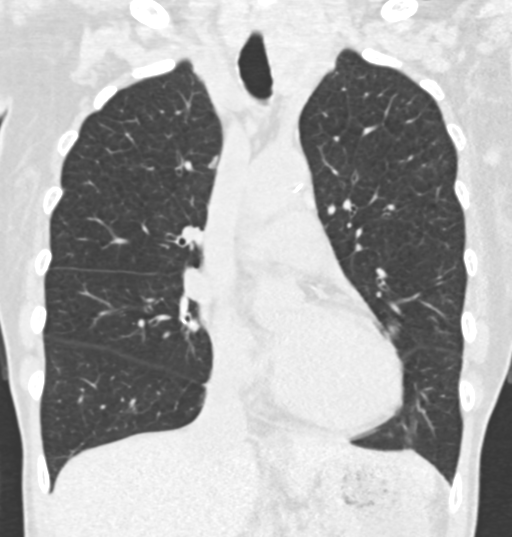
[im 68/114  lung]
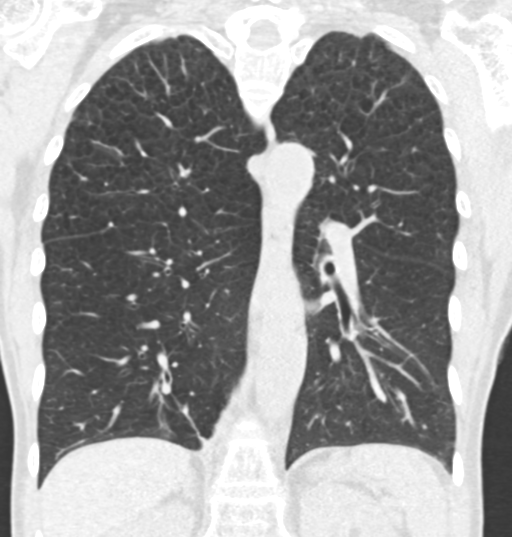

[15 of 36 positions shown; findings below may reference images not displayed]

FINDINGS: Cardiovascular: Normal heart size. Small pericardial
effusion/thickening, slightly increased. Left anterior descending
and right coronary atherosclerosis. Atherosclerotic nonaneurysmal
thoracic aorta. Normal caliber pulmonary arteries.

Mediastinum/Nodes: Stable multinodular goiter asymmetrically
enlarged on the left with dominant hypodense 1.6 cm left thyroid
nodule. This has been evaluated on previous imaging. (ref: [HOSPITAL]. [DATE]): 143-50). Unremarkable esophagus. No
pathologically enlarged axillary, mediastinal or hilar lymph nodes,
noting limited sensitivity for the detection of hilar adenopathy on
this noncontrast study.

Lungs/Pleura: No pneumothorax. No pleural effusion. Marked
centrilobular and paraseptal emphysema with diffuse bronchial wall
thickening. No acute consolidative airspace disease or lung masses.
No significant growth of previously visualized scattered bilateral
pulmonary nodules. No new significant pulmonary nodules.

Upper abdomen: No acute abnormality.

Musculoskeletal: No aggressive appearing focal osseous lesions. Mild
thoracic spondylosis.
IMPRESSION: 1. Lung-RADS 2, benign appearance or behavior. Continue annual
screening with low-dose chest CT without contrast in 12 months.
2. Two vessel coronary atherosclerosis.
3. Small pericardial effusion/thickening, slightly increased.
4. Aortic Atherosclerosis (2LLQB-CPW.W) and Emphysema (2LLQB-1GH.C).

## 2022-12-11 ENCOUNTER — Encounter: Payer: Self-pay | Admitting: Radiology

## 2022-12-11 ENCOUNTER — Other Ambulatory Visit: Payer: Self-pay | Admitting: Emergency Medicine

## 2022-12-11 ENCOUNTER — Telehealth: Payer: Self-pay | Admitting: Emergency Medicine

## 2022-12-11 NOTE — Telephone Encounter (Signed)
Pt was inquiring about medication she was taking but don't know the name of it pt also stated it was prescribed to her a couple of months ago and she threw the bottle away and she has no clue of the name. Please advsie.

## 2022-12-17 ENCOUNTER — Ambulatory Visit: Payer: PPO | Admitting: Emergency Medicine

## 2022-12-17 ENCOUNTER — Encounter: Payer: Self-pay | Admitting: Emergency Medicine

## 2022-12-17 VITALS — BP 118/76 | HR 78 | Temp 97.5°F | Ht 65.0 in | Wt 103.0 lb

## 2022-12-17 DIAGNOSIS — R399 Unspecified symptoms and signs involving the genitourinary system: Secondary | ICD-10-CM | POA: Insufficient documentation

## 2022-12-17 DIAGNOSIS — N390 Urinary tract infection, site not specified: Secondary | ICD-10-CM | POA: Diagnosis not present

## 2022-12-17 LAB — POCT URINALYSIS DIPSTICK
Bilirubin, UA: POSITIVE
Blood, UA: NEGATIVE
Glucose, UA: NEGATIVE
Ketones, UA: POSITIVE
Nitrite, UA: NEGATIVE
Protein, UA: POSITIVE — AB
Spec Grav, UA: 1.015 (ref 1.010–1.025)
Urobilinogen, UA: 1 E.U./dL
pH, UA: 7.5 (ref 5.0–8.0)

## 2022-12-17 MED ORDER — CEFUROXIME AXETIL 250 MG PO TABS
250.0000 mg | ORAL_TABLET | Freq: Two times a day (BID) | ORAL | 0 refills | Status: AC
Start: 2022-12-17 — End: 2022-12-24

## 2022-12-17 NOTE — Assessment & Plan Note (Signed)
Advised to rest and stay well-hydrated May continue taking Azo as needed for burning

## 2022-12-17 NOTE — Progress Notes (Signed)
Terry Armstrong 77 y.o.   Chief Complaint  Patient presents with   Urinary Tract Infection    Patient states she is having some burning, odor when she urinates. Taking AZO.     HISTORY OF PRESENT ILLNESS: Acute problem visit today. This is a 77 y.o. female complaining of urinary burning and frequency for the last couple days along with foul odor. Denies flank pain.  Denies fever or chills.  Denies nausea or vomiting.  Denies abdominal pain. No other associated symptoms. No other complaints or medical concerns today.  Urinary Tract Infection  Associated symptoms include frequency. Pertinent negatives include no chills, nausea or vomiting.     Prior to Admission medications   Medication Sig Start Date End Date Taking? Authorizing Provider  albuterol (VENTOLIN HFA) 108 (90 Base) MCG/ACT inhaler INHALE 2 PUFFS BY MOUTH EVERY 6 HOURS AS NEEDED FOR WHEEZING OR  SHORTNESS  OF  BREATH 07/31/22  Yes Anthonee Gelin, Eilleen Kempf, MD  alendronate (FOSAMAX) 70 MG tablet Take one tablet once weekly. Take with a full glass of water on an empty stomach. 06/19/20  Yes Ngetich, Dinah C, NP  aspirin EC 81 MG tablet Take 1 tablet (81 mg total) by mouth daily. 06/28/19  Yes Collie Siad A, MD  atorvastatin (LIPITOR) 40 MG tablet Take 1 tablet (40 mg total) by mouth daily. 02/18/22  Yes Odilon Cass, Eilleen Kempf, MD  fluticasone [facility]) 50 MCG/ACT nasal spray Place 1 spray into both nostrils daily. 01/16/22  Yes Mound, Rolly Salter E, FNP  Fluticasone-Umeclidin-Vilant (TRELEGY ELLIPTA) 100-62.5-25 MCG/ACT AEPB Inhale 1 puff into the lungs daily. 01/21/22  Yes Oaklee Esther, Eilleen Kempf, MD  losartan (COZAAR) 50 MG tablet Take 1 tablet (50 mg total) by mouth daily. 05/21/22  Yes Bayan Kushnir, Eilleen Kempf, MD  metoprolol succinate (TOPROL-XL) 25 MG 24 hr tablet Take half to whole tablet as needed in the evening for palpitations. 09/19/22  Yes Alver Sorrow, NP  multivitamin-iron-minerals-folic acid (CENTRUM) chewable tablet Chew 1  tablet by mouth daily.   Yes [provider]  Omega 3 1000 MG CAPS Take by mouth daily.   Yes [provider]    No Known Allergies  Patient Active Problem List   Diagnosis Date Noted   Situational mixed anxiety and depressive disorder 01/21/2022   Aortic atherosclerosis (HCC) 06/19/2020   Hx of adenomatous colonic polyps 06/19/2017   Multinodular goiter 03/15/2017   Centrilobular emphysema (HCC) 11/09/2016   Periodic limb movement sleep disorder 01/11/2015   Abnormal TSH 01/11/2015   Upper airway resistance syndrome 01/11/2015   Mild obstructive sleep apnea 01/11/2015   Osteoporosis 01/10/2015   Non-restorative sleep 05/23/2013   Primary snoring    HTN (hypertension) 05/11/2013   DM (diabetes mellitus) (HCC) 06/04/2011   Current smoker 06/04/2011   Hyperlipidemia LDL goal <70 06/04/2011    Past Medical History:  Diagnosis Date   Allergy    Breast abscess    Constipation    if drinks alot of water no issues - uses womens stool softener PRN only    Diabetes mellitus without complication (HCC)    pt has no meds- diet controlled only    Exertional dyspnea 08/18/2019   H/O mammogram 05/06/2019   Per [facility] new patient packet   Hx of adenomatous colonic polyps 06/19/2017   Hypercholesteremia    Per [facility] new patient packet   Hyperlipidemia    Hypertension    Per [facility] new patient packet   Palpitations 08/18/2019   Primary snoring    Thyroid  condition    Per [facility] new patient packet   VT (ventricular tachycardia) (HCC) 09/11/2019    Past Surgical History:  Procedure Laterality Date   ABDOMINAL HYSTERECTOMY  1993   COLONOSCOPY     POLYPECTOMY      Social History   Socioeconomic History   Marital status: Married    Spouse name: Not on file   Number of children: 0   Years of education: 14   Highest education level: Not on file  Occupational History    Employer: RETIRED  Tobacco Use   Smoking status: Every Day    Current packs/day: 1.50    Average  packs/day: 1.5 packs/day for 49.0 years (73.5 ttl pk-yrs)    Types: Cigarettes   Smokeless tobacco: Never   Tobacco comments:    1/2 ppd 01/16/20  Vaping Use   Vaping status: Never Used  Substance and Sexual Activity   Alcohol use: Yes    Comment: 2-4 per week   Drug use: No   Sexual activity: Not Currently  Other Topics Concern   Not on file  Social History Narrative   Diet       Do you drink/eat things with caffeine Coffee      Marital Status Married What year were you married? 2015      Do you live in a house, apartment, assisted living, condo, trailer, etc.? Private House      Is it one or more stories? Yes 3 stories      How many persons live in your home? 2        Do you have any pets in your home?(please list) No      Highest level of education completed: 2 years college      Current or past profession: [employer]      Do you exercise?: Yes    Type and how often: Daily      Do you have a Living Will? (Form that indicates scenarios where you would not want your life prolonged) No      Do you have a DNR form?         If not, would you like to discuss one? No      Do you have signed POA/HPOA forms? No      Do you have difficulty bathing or dressing yourself? No      Do you have difficulty preparing food or eating? No      Do you have difficulty managing medications? No      Do you have difficulty managing your finances? No      Do you have difficulty affording your medications? No                     Social Determinants of Health   Financial Resource Strain: Low Risk  (10/06/2022)   Overall Financial Resource Strain (CARDIA)    Difficulty of Paying Living Expenses: Not hard at all  Food Insecurity: No Food Insecurity (10/06/2022)   Hunger Vital Sign    Worried About Running Out of Food in the Last Year: Never true    Ran Out of Food in the Last Year: Never true  Transportation Needs: No Transportation Needs (10/06/2022)   PRAPARE - Therapist, art (Medical): No    Lack of Transportation (Non-Medical): No  Physical Activity: Insufficiently Active (10/06/2022)   Exercise Vital Sign    Days of Exercise per Week: 3 days  Minutes of Exercise per Session: 30 min  Stress: No Stress Concern Present (10/06/2022)   [employer] of Occupational Health - Occupational Stress Questionnaire    Feeling of Stress : Not at all  Social Connections: Moderately Isolated (10/06/2022)   Social Connection and Isolation Panel [NHANES]    Frequency of Communication with Friends and Family: More than three times a week    Frequency of Social Gatherings with Friends and Family: More than three times a week    Attends Religious Services: More than 4 times per year    Active Member of [employer] or Organizations: No    Attends Banker Meetings: Never    Marital Status: Widowed  Intimate Partner Violence: Not At Risk (10/06/2022)   Humiliation, Afraid, Rape, and Kick questionnaire    Fear of Current or Ex-Partner: No    Emotionally Abused: No    Physically Abused: No    Sexually Abused: No    Family History  Problem Relation Age of Onset   Diabetes Mother    Diabetes Father    Diabetes Other    Hypertension Sister    Hyperlipidemia Other    Heart disease Sister        defibrillator   Kidney failure Maternal Aunt        dialysis   Diabetes Maternal Aunt    Hypertension Maternal Aunt    Diabetes Maternal Aunt    Heart Problems Maternal Grandmother    Cancer Maternal Grandmother    Heart disease Maternal Grandmother    Hypertension Maternal Grandmother    Diabetes Brother    Colon cancer Neg Hx    Colon polyps Neg Hx    Rectal cancer Neg Hx    Stomach cancer Neg Hx      Review of Systems  Constitutional: Negative.  Negative for chills and fever.  HENT: Negative.  Negative for congestion and sore throat.   Respiratory: Negative.  Negative for cough and shortness of breath.   Gastrointestinal:  Negative for  abdominal pain, diarrhea, nausea and vomiting.  Genitourinary:  Positive for dysuria and frequency.       Malodorous urine  Skin: Negative.  Negative for rash.  Neurological: Negative.  Negative for dizziness and headaches.  All other systems reviewed and are negative.   Vitals:   12/17/22 1447  BP: 118/76  Pulse: 78  Temp: (!) 97.5 F (36.4 C)  SpO2: 94%    Physical Exam Vitals reviewed.  Constitutional:      Appearance: Normal appearance.  HENT:     Head: Normocephalic.     Mouth/Throat:     Mouth: Mucous membranes are moist.     Pharynx: Oropharynx is clear.  Eyes:     Extraocular Movements: Extraocular movements intact.     Conjunctiva/sclera: Conjunctivae normal.     Pupils: Pupils are equal, round, and reactive to light.  Cardiovascular:     Rate and Rhythm: Normal rate and regular rhythm.     Pulses: Normal pulses.     Heart sounds: Normal heart sounds.  Pulmonary:     Effort: Pulmonary effort is normal.     Breath sounds: Normal breath sounds.  Abdominal:     Palpations: Abdomen is soft.     Tenderness: There is no abdominal tenderness. There is no right CVA tenderness or left CVA tenderness.  Musculoskeletal:     Cervical back: No tenderness.  Lymphadenopathy:     Cervical: No cervical adenopathy.  Neurological:  Mental Status: She is alert.  Psychiatric:        Mood and Affect: Mood normal.        Behavior: Behavior normal.    Results for orders placed or performed in visit on 12/17/22 (from the past 24 hour(s))  POCT Urinalysis Dipstick     Status: Abnormal   Collection Time: 12/17/22  3:03 PM  Result Value Ref Range   Color, UA amber    Clarity, UA cloudy    Glucose, UA Negative Negative   Bilirubin, UA positive    Ketones, UA positive    Spec Grav, UA 1.015 1.010 - 1.025   Blood, UA negative    pH, UA 7.5 5.0 - 8.0   Protein, UA Positive (A) Negative   Urobilinogen, UA 1.0 0.2 or 1.0 E.U./dL   Nitrite, UA negative    Leukocytes, UA  Large (3+) (A) Negative   Appearance     Odor       ASSESSMENT & PLAN: A total of 33 minutes was spent with the patient and counseling/coordination of care regarding preparing for this visit, review of most recent office visit notes, review of chronic medical problems under management, review of all medications, diagnosis of urinary tract infection and need to start antibiotics, review of urinalysis, prognosis, documentation, and need for follow-up if no better or worse during the next several days..   Problem List Items Addressed This Visit       Genitourinary   Acute UTI - Primary    Clinically stable.  No signs of pyelonephritis. Positive urinalysis.  Urine sent for culture. Recommend to start Ceftin 250 mg twice a day for 7 days Advised to rest and stay well-hydrated Advised to contact the office if no better or worse during the next several days.      Relevant Medications   cefUROXime (CEFTIN) 250 MG tablet   Other Relevant Orders   POCT Urinalysis Dipstick (Completed)   Urine Culture     Other   Lower urinary tract symptoms    Advised to rest and stay well-hydrated May continue taking Azo as needed for burning      Patient Instructions  Urinary Tract Infection, Adult A urinary tract infection (UTI) is an infection of any part of the urinary tract. The urinary tract includes: The kidneys. The ureters. The bladder. The urethra. These organs make, store, and get rid of pee (urine) in the body. What are the causes? This infection is caused by germs (bacteria) in your genital area. These germs grow and cause swelling (inflammation) of your urinary tract. What increases the risk? The following factors may make you more likely to develop this condition: Using a small, thin tube (catheter) to drain pee. Not being able to control when you pee or poop (incontinence). Being female. If you are female, these things can increase the risk: Using these methods to prevent  pregnancy: A medicine that kills sperm (spermicide). A device that blocks sperm (diaphragm). Having low levels of a female hormone (estrogen). Being pregnant. You are more likely to develop this condition if: You have genes that add to your risk. You are sexually active. You take antibiotic medicines. You have trouble peeing because of: A prostate that is bigger than normal, if you are female. A blockage in the part of your body that drains pee from the bladder. A kidney stone. A nerve condition that affects your bladder. Not getting enough to drink. Not peeing often enough. You have other conditions, such  as: Diabetes. A weak disease-fighting system (immune system). Sickle cell disease. Gout. Injury of the spine. What are the signs or symptoms? Symptoms of this condition include: Needing to pee right away. Peeing small amounts often. Pain or burning when peeing. Blood in the pee. Pee that smells bad or not like normal. Trouble peeing. Pee that is cloudy. Fluid coming from the vagina, if you are female. Pain in the belly or lower back. Other symptoms include: Vomiting. Not feeling hungry. Feeling mixed up (confused). This may be the first symptom in older adults. Being tired and grouchy (irritable). A fever. Watery poop (diarrhea). How is this treated? Taking antibiotic medicine. Taking other medicines. Drinking enough water. In some cases, you may need to see a specialist. Follow these instructions at home:  Medicines Take over-the-counter and prescription medicines only as told by your doctor. If you were prescribed an antibiotic medicine, take it as told by your doctor. Do not stop taking it even if you start to feel better. General instructions Make sure you: Pee until your bladder is empty. Do not hold pee for a long time. Empty your bladder after sex. Wipe from front to back after peeing or pooping if you are a female. Use each tissue one time when you  wipe. Drink enough fluid to keep your pee pale yellow. Keep all follow-up visits. Contact a doctor if: You do not get better after 1-2 days. Your symptoms go away and then come back. Get help right away if: You have very bad back pain. You have very bad pain in your lower belly. You have a fever. You have chills. You feeling like you will vomit or you vomit. Summary A urinary tract infection (UTI) is an infection of any part of the urinary tract. This condition is caused by germs in your genital area. There are many risk factors for a UTI. Treatment includes antibiotic medicines. Drink enough fluid to keep your pee pale yellow. This information is not intended to replace advice given to you by your health care provider. Make sure you discuss any questions you have with your health care provider. Document Revised: 11/27/2019 Document Reviewed: 12/02/2019 Elsevier Patient Education  2024 Elsevier Inc.     Edwina Barth, MD [facility]

## 2022-12-17 NOTE — Patient Instructions (Signed)
Urinary Tract Infection, Adult A urinary tract infection (UTI) is an infection of any part of the urinary tract. The urinary tract includes: The kidneys. The ureters. The bladder. The urethra. These organs make, store, and get rid of pee (urine) in the body. What are the causes? This infection is caused by germs (bacteria) in your genital area. These germs grow and cause swelling (inflammation) of your urinary tract. What increases the risk? The following factors may make you more likely to develop this condition: Using a small, thin tube (catheter) to drain pee. Not being able to control when you pee or poop (incontinence). Being female. If you are female, these things can increase the risk: Using these methods to prevent pregnancy: A medicine that kills sperm (spermicide). A device that blocks sperm (diaphragm). Having low levels of a female hormone (estrogen). Being pregnant. You are more likely to develop this condition if: You have genes that add to your risk. You are sexually active. You take antibiotic medicines. You have trouble peeing because of: A prostate that is bigger than normal, if you are female. A blockage in the part of your body that drains pee from the bladder. A kidney stone. A nerve condition that affects your bladder. Not getting enough to drink. Not peeing often enough. You have other conditions, such as: Diabetes. A weak disease-fighting system (immune system). Sickle cell disease. Gout. Injury of the spine. What are the signs or symptoms? Symptoms of this condition include: Needing to pee right away. Peeing small amounts often. Pain or burning when peeing. Blood in the pee. Pee that smells bad or not like normal. Trouble peeing. Pee that is cloudy. Fluid coming from the vagina, if you are female. Pain in the belly or lower back. Other symptoms include: Vomiting. Not feeling hungry. Feeling mixed up (confused). This may be the first symptom in  older adults. Being tired and grouchy (irritable). A fever. Watery poop (diarrhea). How is this treated? Taking antibiotic medicine. Taking other medicines. Drinking enough water. In some cases, you may need to see a specialist. Follow these instructions at home:  Medicines Take over-the-counter and prescription medicines only as told by your doctor. If you were prescribed an antibiotic medicine, take it as told by your doctor. Do not stop taking it even if you start to feel better. General instructions Make sure you: Pee until your bladder is empty. Do not hold pee for a long time. Empty your bladder after sex. Wipe from front to back after peeing or pooping if you are a female. Use each tissue one time when you wipe. Drink enough fluid to keep your pee pale yellow. Keep all follow-up visits. Contact a doctor if: You do not get better after 1-2 days. Your symptoms go away and then come back. Get help right away if: You have very bad back pain. You have very bad pain in your lower belly. You have a fever. You have chills. You feeling like you will vomit or you vomit. Summary A urinary tract infection (UTI) is an infection of any part of the urinary tract. This condition is caused by germs in your genital area. There are many risk factors for a UTI. Treatment includes antibiotic medicines. Drink enough fluid to keep your pee pale yellow. This information is not intended to replace advice given to you by your health care provider. Make sure you discuss any questions you have with your health care provider. Document Revised: 11/27/2019 Document Reviewed: 12/02/2019 Elsevier Patient Education    2024 Elsevier Inc.  

## 2022-12-17 NOTE — Assessment & Plan Note (Signed)
Clinically stable.  No signs of pyelonephritis. Positive urinalysis.  Urine sent for culture. Recommend to start Ceftin 250 mg twice a day for 7 days Advised to rest and stay well-hydrated Advised to contact the office if no better or worse during the next several days.

## 2022-12-19 LAB — URINE CULTURE

## 2023-01-31 ENCOUNTER — Other Ambulatory Visit: Payer: Self-pay | Admitting: Family

## 2023-01-31 ENCOUNTER — Other Ambulatory Visit: Payer: Self-pay | Admitting: Emergency Medicine

## 2023-01-31 DIAGNOSIS — J432 Centrilobular emphysema: Secondary | ICD-10-CM

## 2023-02-02 ENCOUNTER — Encounter: Payer: Self-pay | Admitting: Emergency Medicine

## 2023-03-05 ENCOUNTER — Other Ambulatory Visit: Payer: Self-pay | Admitting: Family

## 2023-03-05 ENCOUNTER — Other Ambulatory Visit: Payer: Self-pay | Admitting: Emergency Medicine

## 2023-03-05 DIAGNOSIS — F5101 Primary insomnia: Secondary | ICD-10-CM

## 2023-03-05 DIAGNOSIS — J432 Centrilobular emphysema: Secondary | ICD-10-CM

## 2023-03-05 DIAGNOSIS — E785 Hyperlipidemia, unspecified: Secondary | ICD-10-CM

## 2023-04-06 ENCOUNTER — Telehealth: Payer: Self-pay

## 2023-04-06 ENCOUNTER — Ambulatory Visit (HOSPITAL_BASED_OUTPATIENT_CLINIC_OR_DEPARTMENT_OTHER): Payer: PPO | Admitting: Cardiovascular Disease

## 2023-04-06 ENCOUNTER — Encounter (HOSPITAL_BASED_OUTPATIENT_CLINIC_OR_DEPARTMENT_OTHER): Payer: Self-pay | Admitting: Cardiovascular Disease

## 2023-04-06 VITALS — BP 128/70 | HR 81 | Ht 65.0 in | Wt 103.4 lb

## 2023-04-06 DIAGNOSIS — I700 Atherosclerosis of aorta: Secondary | ICD-10-CM

## 2023-04-06 DIAGNOSIS — E119 Type 2 diabetes mellitus without complications: Secondary | ICD-10-CM

## 2023-04-06 DIAGNOSIS — F17200 Nicotine dependence, unspecified, uncomplicated: Secondary | ICD-10-CM

## 2023-04-06 DIAGNOSIS — G4733 Obstructive sleep apnea (adult) (pediatric): Secondary | ICD-10-CM | POA: Diagnosis not present

## 2023-04-06 DIAGNOSIS — Z0000 Encounter for general adult medical examination without abnormal findings: Secondary | ICD-10-CM

## 2023-04-06 DIAGNOSIS — J432 Centrilobular emphysema: Secondary | ICD-10-CM

## 2023-04-06 MED ORDER — ALBUTEROL SULFATE HFA 108 (90 BASE) MCG/ACT IN AERS
INHALATION_SPRAY | RESPIRATORY_TRACT | 0 refills | Status: DC
Start: 2023-04-06 — End: 2023-11-17

## 2023-04-06 NOTE — Patient Instructions (Addendum)
Medication Instructions:  Your physician recommends that you continue on your current medications as directed. Please refer to the Current Medication list given to you today.   *If you need a refill on your cardiac medications before your next appointment, please call your pharmacy*  Lab Work: NONE   Testing/Procedures: NONE   Follow-Up: At [facility], you and your health needs are our priority.  As part of our continuing mission to provide you with exceptional heart care, we have created designated Provider Care Teams.  These Care Teams include your primary Cardiologist (physician) and Advanced Practice Providers (APPs -  Physician Assistants and Nurse Practitioners) who all work together to provide you with the care you need, when you need it.  We recommend signing up for the patient portal called "MyChart".  Sign up information is provided on this After Visit Summary.  MyChart is used to connect with patients for Virtual Visits (Telemedicine).  Patients are able to view lab/test results, encounter notes, upcoming appointments, etc.  Non-urgent messages can be sent to your provider as well.   To learn more about what you can do with MyChart, go to [email or web address].    Your next appointment:   12 month(s)  Provider:   Chilton Si, MD or [name], NP     Other Instructions AMY OUR CARE GUIDE WILL BE REACHING OUT TO YOU WITH SMOKING ASSISTANCE [email or web address]

## 2023-04-06 NOTE — Progress Notes (Signed)
Cardiology Office Note:  .   Date:  04/06/2023  ID:  Terry Armstrong, DOB 02/04/1946, MRN [id number] PCP: Georgina Quint, MD  [facility] Providers Cardiologist:  Chilton Si, MD    History of Present Illness: .   Terry Armstrong is a 77 y.o. female with non-obstructive CAD, hypertension and prior tobacco abuse who presents for follow-up.  She was initially seen in 2021 for an evaluation of chest discomfort.  She reported three months of intermittent episodes of night sweats.  She felt like her heart is racing and like she needs to belch.  Sometimes belching helps, sometimes it does not.  This of breath.    Of note, her sister has congestive heart failure and has ICD.  She was referred for an ETT that was negative but revealed poor exercise tolerance.  1 minute and 45 seconds (4.1 METS).  She was referred for [facility].  However prior to this happening she was wearing a 30-day event monitor and was found to have episodes of NSVT.  She was referred to the ED.  In the ED she had an echo that showed no structural abnormalities and a normal systolic function.  She was started on metoprolol and discharged home.  She had an outpatient Myoview 09/2019 that revealed LVEF 57% and no ischemia.    At her visit 10/2019 she was stress of caring for both her husband and brother.  She felt poorly on metoprolol so it was switched to as needed.  She had PFTs that were abnormal and she was referred to pulmonary.  She was not seen by cardiology again until 05/2022, at which time she noted that in the interval she had lost both her husband and sister.  She continued to note exertional dyspnea and had a [employer] 06/2022 that revealed LVEF 66% and no ischemia.  She followed up with Terry Shields, NP 09/2022 and encouraged to quit smoking.   Terry Armstrong presents with occasional shortness of breath, particularly when moving quickly or climbing stairs. She reports that the use of an albuterol inhaler  has been beneficial in managing these episodes. However, she has run out of the inhaler and requests a refill. She also mentions that she has not been using the prescribed Trelegy inhaler daily as recommended, which may be contributing to her respiratory symptoms.  Terry Armstrong also discusses her struggle with loneliness following the loss of her spouse. She expresses interest in joining senior organizations and resuming bowling, a pastime she previously enjoyed, as potential ways to stay active and socially engaged.  She admits to smoking, although she is trying to cut back. She reports having purchased a pack of cigarettes several days ago, of which over half remains. She expresses a willingness to try using nicotine patches to aid in smoking cessation.  Terry Armstrong remains active in her daily life, running errands and maintaining a three-level home. She denies any chest pain or pressure when walking around, but does note feeling short of breath if she moves too quickly. Despite her age and health issues, the patient maintains a positive outlook and is proactive in managing her health.     ROS:  As per HPI  Studies Reviewed: Marland Kitchen   EKG Interpretation Date/Time:  Monday April 06 2023 10:09:40 EST Ventricular Rate:  80 PR Interval:  162 QRS Duration:  72 QT Interval:  388 QTC Calculation: 447 R Axis:   -6  Text Interpretation: Normal sinus rhythm Low voltage QRS Cannot rule out  Septal infarct (cited on or before 11-Sep-2019) No significant change since last tracing Confirmed by Chilton Si ([id number]) on 04/06/2023 10:18:03 AM     Risk Assessment/Calculations:             Physical Exam:   VS:  BP 128/70   Pulse 81   Ht 5\' 5"  (1.651 m)   Wt 103 lb 6.4 oz (46.9 kg)   SpO2 97%   BMI 17.21 kg/m  , BMI Body mass index is 17.21 kg/m. GENERAL:  Well appearing HEENT: Pupils equal round and reactive, fundi not visualized, oral mucosa unremarkable NECK:  No jugular venous distention,  waveform within normal limits, carotid upstroke brisk and symmetric, no bruits, no thyromegaly LUNGS:  Clear to auscultation bilaterally HEART:  RRR.  PMI not displaced or sustained,S1 and S2 within normal limits, no S3, no S4, no clicks, no rubs, no murmurs ABD:  Flat, positive bowel sounds normal in frequency in pitch, no bruits, no rebound, no guarding, no midline pulsatile mass, no hepatomegaly, no splenomegaly EXT:  2 plus pulses throughout, no edema, no cyanosis no clubbing SKIN:  No rashes no nodules NEURO:  Cranial nerves II through XII grossly intact, motor grossly intact throughout PSYCH:  Cognitively intact, oriented to person place and time   ASSESSMENT AND PLAN: .    # Chronic Obstructive Pulmonary Disease (COPD) Reports occasional shortness of breath with exertion. Not using Trelegy inhaler daily as prescribed. Albuterol inhaler depleted. -Encouraged daily use of Trelegy inhaler. -Refill Albuterol inhaler for as-needed use. -Consider referral to health coach for additional support.  # Tobacco Use Reports decreased but ongoing smoking. Previous attempts to quit with lozenges unsuccessful. -Encouraged to limit to no more than 3 cigarettes per day. -Suggested trying nicotine patch for cessation support. -Referral to 1-800-QUIT-NOW for additional cessation resources and potential free patches. -Consider referral to health coach for additional support.   # Non-obstructive CAD:  # hyperlipidemia:  Lipids are well-controlled.  No ischemic symptoms. -Continue aspirin, atorvastatin and metoprolol.  General Health Maintenance -Encouraged increased physical activity through bowling and maintaining active lifestyle. -Next routine visit in 1 year unless issues arise.        Signed, Chilton Si, MD

## 2023-04-06 NOTE — Telephone Encounter (Signed)
Called patient per health coaching referral for smoking cessation from Dr. Duke Salvia. Patient is interested in participating in the program and has been scheduled for 12/09 at 9:00am for an in-person session. Mailed patient a copy of the [employer] and Welcome Letter/Code of Ethics for her records.   Renaee Munda, MS, ERHD, [facility]  Care Guide, Health & Wellness Coach [address], [address] [place] [address] Telephone: [phone] Email: [email or web address]

## 2023-04-13 ENCOUNTER — Other Ambulatory Visit: Payer: Self-pay | Admitting: Emergency Medicine

## 2023-04-13 ENCOUNTER — Ambulatory Visit: Payer: PPO | Attending: Cardiovascular Disease

## 2023-04-13 DIAGNOSIS — J432 Centrilobular emphysema: Secondary | ICD-10-CM

## 2023-04-13 DIAGNOSIS — Z0000 Encounter for general adult medical examination without abnormal findings: Secondary | ICD-10-CM

## 2023-04-13 NOTE — Progress Notes (Unsigned)
HEALTH & WELLNESS COACHING INITIAL INTAKE   Appointment Outcome:  Completed, Session #:                        Start time: __________   End time: __________   Total Mins: _________     What are the Patient's goals from Coaching?     Why did they seek coaching now?     Readiness - What stage is the patient in regarding their goal(s)?  (Precontemplation, Contemplation, Preparation, Action, Maintenance, Relapse)       Patient review of Health Coaching Agreement Reviewed Coaching Agreement and Code of Ethics with Patient during initial session. Answered any questions the patient had if any regarding the Coaching Agreement and Code of Ethics. Patient verbally agreed to adhere to the Coaching Agreement and to abide by the Code of Ethics.  Mailed patient with a hard/electronic copy of the Coaching Agreement and Code of Ethics.     Coaching Progress Notes:         Coaching Outcomes     AGREEMENTS SECTION   Overall Goal(s): What do you want to change or accomplish taking into consideration what you can accomplish in this 74-month time frame? Make sure to include the expected outcome after completing this plan.                                                   Agreement/Action Steps:  These steps the patient agrees/d to for desired change. Document as much or as little as needed.        Referrals: Any referrals or follow-up resources discussed in the coaching session  Resources:  Quit smoking over the next three months by reducing the number of cigarettes smoking daily                                          Agreement/Action Steps:  Track number of cigarettes smoked daily Practice mini quits Increase shortest period between smoking from 30 minutes by 15 minutes Watch tv Play games on cell phone Talking to a friend/family member Engage in a physical activity (e.g. raking leaves, trash removal, house chores) Take out an allotment of 3-4 cigarettes during the  evening and put pack away    Patient review of Health Coaching Agreement Reviewed Coaching Agreement and Code of Ethics with Patient during initial session. Answered any questions the patient had if any regarding the Coaching Agreement and Code of Ethics. Patient verbally agreed to adhere to the Coaching Agreement and to abide by the Code of Ethics.  Mailed patient with a hard/electronic copy of the Coaching Agreement and Code of Ethics.    Resources:  Mailed patient tobacco tracking sheets and senior resources, emailed patient an outline of action steps, and women support groups.

## 2023-04-23 ENCOUNTER — Telehealth: Payer: Self-pay | Admitting: Cardiovascular Disease

## 2023-04-23 ENCOUNTER — Telehealth: Payer: Self-pay

## 2023-04-23 DIAGNOSIS — Z0000 Encounter for general adult medical examination without abnormal findings: Secondary | ICD-10-CM

## 2023-04-23 NOTE — Telephone Encounter (Signed)
Returned patient's call to reschedule health coaching appt from 04/27/23. Patient stated that she will be out of town and requested to be rescheduled for 05/11/23 at 2:00pm. Patient has been rescheduled as requested for an in-person visit.   Renaee Munda, MS, ERHD, [facility]  Care Guide, Health & Wellness Coach [address] [place] [address] Telephone: [phone] Email: [email or web address]

## 2023-04-23 NOTE — Telephone Encounter (Signed)
Pt needs to reschedule their appt with the nurse. Please advise

## 2023-04-23 NOTE — Telephone Encounter (Signed)
Will forward to amy lee to make her aware.

## 2023-04-27 ENCOUNTER — Ambulatory Visit: Payer: Self-pay

## 2023-05-05 ENCOUNTER — Telehealth: Payer: Self-pay

## 2023-05-05 DIAGNOSIS — Z0000 Encounter for general adult medical examination without abnormal findings: Secondary | ICD-10-CM

## 2023-05-05 NOTE — Telephone Encounter (Signed)
 Called patient to reschedule health coaching appt due to being doublebooked on 1/6 at 2:00pm. Patient stated that she is available on 1/8 at 1:00pm for an in-person appt. Patient has been scheduled accordingly.   Greig Ruth, MS, ERHD, [facility]  Care Guide, Health & Wellness Coach [address] [place] [address] Telephone: [phone] Email: [email or web address]

## 2023-05-11 ENCOUNTER — Ambulatory Visit: Payer: PPO

## 2023-05-13 ENCOUNTER — Telehealth: Payer: Self-pay

## 2023-05-13 ENCOUNTER — Ambulatory Visit: Payer: PPO

## 2023-05-13 DIAGNOSIS — Z0000 Encounter for general adult medical examination without abnormal findings: Secondary | ICD-10-CM

## 2023-05-13 NOTE — Telephone Encounter (Addendum)
 Returned patient's call regarding today's health coaching appointment. Patient stated that she has a fever and does not feel well. Patient requested to cancel her appt today and stated she would call next week to reschedule.    Greig Ruth, MS, ERHD, [facility]  Care Guide, Health & Wellness Coach [address], [address] [place] [address] Telephone: [phone] Email: [email or web address]

## 2023-06-01 ENCOUNTER — Ambulatory Visit: Payer: PPO | Admitting: Emergency Medicine

## 2023-06-04 ENCOUNTER — Ambulatory Visit (INDEPENDENT_AMBULATORY_CARE_PROVIDER_SITE_OTHER): Payer: PPO | Admitting: Emergency Medicine

## 2023-06-04 ENCOUNTER — Encounter: Payer: Self-pay | Admitting: Emergency Medicine

## 2023-06-04 VITALS — BP 184/110 | HR 93 | Temp 97.9°F | Ht 65.0 in | Wt 102.4 lb

## 2023-06-04 DIAGNOSIS — J432 Centrilobular emphysema: Secondary | ICD-10-CM | POA: Diagnosis not present

## 2023-06-04 DIAGNOSIS — N898 Other specified noninflammatory disorders of vagina: Secondary | ICD-10-CM | POA: Insufficient documentation

## 2023-06-04 DIAGNOSIS — I700 Atherosclerosis of aorta: Secondary | ICD-10-CM

## 2023-06-04 DIAGNOSIS — F17200 Nicotine dependence, unspecified, uncomplicated: Secondary | ICD-10-CM

## 2023-06-04 DIAGNOSIS — I10 Essential (primary) hypertension: Secondary | ICD-10-CM

## 2023-06-04 MED ORDER — TRELEGY ELLIPTA 100-62.5-25 MCG/ACT IN AEPB: 1.0000 | INHALATION_SPRAY | Freq: Every day | RESPIRATORY_TRACT | 3 refills | Status: DC

## 2023-06-04 NOTE — Assessment & Plan Note (Signed)
Stable.  Continues to smoke. Continue daily Trelegy

## 2023-06-04 NOTE — Assessment & Plan Note (Signed)
Abnormal and of recent onset Denies vaginal bleeding Denies pain Needs gynecological evaluation. Referral placed today.

## 2023-06-04 NOTE — Assessment & Plan Note (Signed)
Diet and nutrition discussed. Continue atorvastatin 40 mg daily.

## 2023-06-04 NOTE — Patient Instructions (Signed)
Health Maintenance After Age 79 After age 41, you are at a higher risk for certain long-term diseases and infections as well as injuries from falls. Falls are a major cause of broken bones and head injuries in people who are older than age 26. Getting regular preventive care can help to keep you healthy and well. Preventive care includes getting regular testing and making lifestyle changes as recommended by your health care provider. Talk with your health care provider about: Which screenings and tests you should have. A screening is a test that checks for a disease when you have no symptoms. A diet and exercise plan that is right for you. What should I know about screenings and tests to prevent falls? Screening and testing are the best ways to find a health problem early. Early diagnosis and treatment give you the best chance of managing medical conditions that are common after age 48. Certain conditions and lifestyle choices may make you more likely to have a fall. Your health care provider may recommend: Regular vision checks. Poor vision and conditions such as cataracts can make you more likely to have a fall. If you wear glasses, make sure to get your prescription updated if your vision changes. Medicine review. Work with your health care provider to regularly review all of the medicines you are taking, including over-the-counter medicines. Ask your health care provider about any side effects that may make you more likely to have a fall. Tell your health care provider if any medicines that you take make you feel dizzy or sleepy. Strength and balance checks. Your health care provider may recommend certain tests to check your strength and balance while standing, walking, or changing positions. Foot health exam. Foot pain and numbness, as well as not wearing proper footwear, can make you more likely to have a fall. Screenings, including: Osteoporosis screening. Osteoporosis is a condition that causes  the bones to get weaker and break more easily. Blood pressure screening. Blood pressure changes and medicines to control blood pressure can make you feel dizzy. Depression screening. You may be more likely to have a fall if you have a fear of falling, feel depressed, or feel unable to do activities that you used to do. Alcohol use screening. Using too much alcohol can affect your balance and may make you more likely to have a fall. Follow these instructions at home: Lifestyle Do not drink alcohol if: Your health care provider tells you not to drink. If you drink alcohol: Limit how much you have to: 0-1 drink a day for women. 0-2 drinks a day for men. Know how much alcohol is in your drink. In the [place] one drink equals one 12 oz bottle of beer (355 mL), one 5 oz glass of wine (148 mL), or one 1 oz glass of hard liquor (44 mL). Do not use any products that contain nicotine or tobacco. These products include cigarettes, chewing tobacco, and vaping devices, such as e-cigarettes. If you need help quitting, ask your health care provider. Activity  Follow a regular exercise program to stay fit. This will help you maintain your balance. Ask your health care provider what types of exercise are appropriate for you. If you need a cane or walker, use it as recommended by your health care provider. Wear supportive shoes that have nonskid soles. Safety  Remove any tripping hazards, such as rugs, cords, and clutter. Install safety equipment such as grab bars in bathrooms and safety rails on stairs. Keep rooms and walkways  well-lit. General instructions Talk with your health care provider about your risks for falling. Tell your health care provider if: You fall. Be sure to tell your health care provider about all falls, even ones that seem minor. You feel dizzy, tiredness (fatigue), or off-balance. Take over-the-counter and prescription medicines only as told by your health care provider. These include  supplements. Eat a healthy diet and maintain a healthy weight. A healthy diet includes low-fat dairy products, low-fat (lean) meats, and fiber from whole grains, beans, and lots of fruits and vegetables. Stay current with your vaccines. Schedule regular health, dental, and eye exams. Summary Having a healthy lifestyle and getting preventive care can help to protect your health and wellness after age 24. Screening and testing are the best way to find a health problem early and help you avoid having a fall. Early diagnosis and treatment give you the best chance for managing medical conditions that are more common for people who are older than age 81. Falls are a major cause of broken bones and head injuries in people who are older than age 75. Take precautions to prevent a fall at home. Work with your health care provider to learn what changes you can make to improve your health and wellness and to prevent falls. This information is not intended to replace advice given to you by your health care provider. Make sure you discuss any questions you have with your health care provider. Document Revised: 09/10/2020 Document Reviewed: 09/10/2020 Elsevier Patient Education  2024 [employer].

## 2023-06-04 NOTE — Assessment & Plan Note (Signed)
Cardiovascular and cancer risks associated with smoking discussed. Smoking cessation advice given.

## 2023-06-04 NOTE — Assessment & Plan Note (Signed)
Well-controlled hypertension Continue losartan 50 mg daily and metoprolol succinate 25 mg daily Cardiovascular risk associated with hypertension discussed. Dietary approaches to stop hypertension discussed

## 2023-06-04 NOTE — Progress Notes (Signed)
Terry Armstrong 78 y.o.   Chief Complaint  Patient presents with   Referral    GYN    HISTORY OF PRESENT ILLNESS: This is a 78 y.o. female complaining of intermittent clear vaginal discharge since last December.  Abnormal for her.  Denies vaginal bleeding. Denies pelvic or abdominal pain.  Requesting referral to gynecologist Also requesting refill on her Trelegy. No other complaints or medical concerns today.  HPI   Prior to Admission medications   Medication Sig Start Date End Date Taking? Authorizing Provider  albuterol (VENTOLIN HFA) 108 (90 Base) MCG/ACT inhaler INHALE 2 PUFFS BY MOUTH EVERY 6 HOURS AS NEEDED FOR WHEEZING OR  SHORTNESS  OF  BREATH 04/06/23  Yes Chilton Si, MD  aspirin EC 81 MG tablet Take 1 tablet (81 mg total) by mouth daily. 06/28/19  Yes Doristine Bosworth, MD  atorvastatin (LIPITOR) 40 MG tablet Take 1 tablet by mouth once daily 03/05/23  Yes Berdena Cisek, Eilleen Kempf, MD  metoprolol succinate (TOPROL-XL) 25 MG 24 hr tablet Take half to whole tablet as needed in the evening for palpitations. 09/19/22  Yes Alver Sorrow, NP  multivitamin-iron-minerals-folic acid (CENTRUM) chewable tablet Chew 1 tablet by mouth daily.   Yes [provider]  Omega 3 1000 MG CAPS Take by mouth daily.   Yes [provider]  Dwyane Luo 100-62.5-25 MCG/ACT AEPB INHALE 1 PUFF ONCE DAILY 04/13/23  Yes Kjuan Seipp, Eilleen Kempf, MD  alendronate (FOSAMAX) 70 MG tablet Take one tablet once weekly. Take with a full glass of water on an empty stomach. Patient not taking: Reported on 06/04/2023 06/19/20   Ngetich, Dinah C, NP  losartan (COZAAR) 50 MG tablet Take 1 tablet (50 mg total) by mouth daily. Patient not taking: Reported on 06/04/2023 05/21/22   Georgina Quint, MD    No Known Allergies  Patient Active Problem List   Diagnosis Date Noted   Acute UTI 12/17/2022   Lower urinary tract symptoms 12/17/2022   Situational mixed anxiety and depressive disorder  01/21/2022   Aortic atherosclerosis (HCC) 06/19/2020   Hx of adenomatous colonic polyps 06/19/2017   Multinodular goiter 03/15/2017   Centrilobular emphysema (HCC) 11/09/2016   Periodic limb movement sleep disorder 01/11/2015   Abnormal TSH 01/11/2015   Upper airway resistance syndrome 01/11/2015   Mild obstructive sleep apnea 01/11/2015   Osteoporosis 01/10/2015   Non-restorative sleep 05/23/2013   Primary snoring    HTN (hypertension) 05/11/2013   DM (diabetes mellitus) (HCC) 06/04/2011   Current smoker 06/04/2011   Hyperlipidemia LDL goal <70 06/04/2011    Past Medical History:  Diagnosis Date   Allergy    Breast abscess    Constipation    if drinks alot of water no issues - uses womens stool softener PRN only    Diabetes mellitus without complication (HCC)    pt has no meds- diet controlled only    Exertional dyspnea 08/18/2019   H/O mammogram 05/06/2019   Per [facility] new patient packet   Hx of adenomatous colonic polyps 06/19/2017   Hypercholesteremia    Per [facility] new patient packet   Hyperlipidemia    Hypertension    Per [facility] new patient packet   Palpitations 08/18/2019   Primary snoring    Thyroid condition    Per [facility] new patient packet   VT (ventricular tachycardia) (HCC) 09/11/2019    Past Surgical History:  Procedure Laterality Date   ABDOMINAL HYSTERECTOMY  1993   COLONOSCOPY     POLYPECTOMY  Social History   Socioeconomic History   Marital status: Married    Spouse name: Not on file   Number of children: 0   Years of education: 14   Highest education level: Not on file  Occupational History    Employer: RETIRED  Tobacco Use   Smoking status: Every Day    Current packs/day: 1.50    Average packs/day: 1.5 packs/day for 49.0 years (73.5 ttl pk-yrs)    Types: Cigarettes   Smokeless tobacco: Never   Tobacco comments:    1/2 ppd 01/16/20        Patient is participating in health coaching for smoking cessation as of 04/13/23  Vaping Use   Vaping  status: Never Used  Substance and Sexual Activity   Alcohol use: Yes    Comment: 2-4 per week   Drug use: No   Sexual activity: Not Currently  Other Topics Concern   Not on file  Social History Narrative   Diet       Do you drink/eat things with caffeine Coffee      Marital Status Married What year were you married? 2015      Do you live in a house, apartment, assisted living, condo, trailer, etc.? Private House      Is it one or more stories? Yes 3 stories      How many persons live in your home? 2        Do you have any pets in your home?(please list) No      Highest level of education completed: 2 years college      Current or past profession: [employer]      Do you exercise?: Yes    Type and how often: Daily      Do you have a Living Will? (Form that indicates scenarios where you would not want your life prolonged) No      Do you have a DNR form?         If not, would you like to discuss one? No      Do you have signed POA/HPOA forms? No      Do you have difficulty bathing or dressing yourself? No      Do you have difficulty preparing food or eating? No      Do you have difficulty managing medications? No      Do you have difficulty managing your finances? No      Do you have difficulty affording your medications? No                     Social Drivers of Corporate investment banker Strain: Low Risk  (10/06/2022)   Overall Financial Resource Strain (CARDIA)    Difficulty of Paying Living Expenses: Not hard at all  Food Insecurity: No Food Insecurity (10/06/2022)   Hunger Vital Sign    Worried About Running Out of Food in the Last Year: Never true    Ran Out of Food in the Last Year: Never true  Transportation Needs: No Transportation Needs (10/06/2022)   PRAPARE - Administrator, Civil Service (Medical): No    Lack of Transportation (Non-Medical): No  Physical Activity: Insufficiently Active (10/06/2022)   Exercise Vital Sign    Days of  Exercise per Week: 3 days    Minutes of Exercise per Session: 30 min  Stress: No Stress Concern Present (10/06/2022)   [employer] of Occupational Health - Occupational Stress Questionnaire  Feeling of Stress : Not at all  Social Connections: Moderately Isolated (10/06/2022)   Social Connection and Isolation Panel [NHANES]    Frequency of Communication with Friends and Family: More than three times a week    Frequency of Social Gatherings with Friends and Family: More than three times a week    Attends Religious Services: More than 4 times per year    Active Member of [employer] or Organizations: No    Attends Banker Meetings: Never    Marital Status: Widowed  Intimate Partner Violence: Not At Risk (10/06/2022)   Humiliation, Afraid, Rape, and Kick questionnaire    Fear of Current or Ex-Partner: No    Emotionally Abused: No    Physically Abused: No    Sexually Abused: No    Family History  Problem Relation Age of Onset   Diabetes Mother    Diabetes Father    Diabetes Other    Hypertension Sister    Hyperlipidemia Other    Heart disease Sister        defibrillator   Kidney failure Maternal Aunt        dialysis   Diabetes Maternal Aunt    Hypertension Maternal Aunt    Diabetes Maternal Aunt    Heart Problems Maternal Grandmother    Cancer Maternal Grandmother    Heart disease Maternal Grandmother    Hypertension Maternal Grandmother    Diabetes Brother    Colon cancer Neg Hx    Colon polyps Neg Hx    Rectal cancer Neg Hx    Stomach cancer Neg Hx      Review of Systems  Constitutional: Negative.  Negative for chills and fever.  HENT: Negative.  Negative for congestion and sore throat.   Respiratory: Negative.  Negative for cough and shortness of breath.   Gastrointestinal:  Negative for abdominal pain, diarrhea, nausea and vomiting.  Genitourinary: Negative.  Negative for dysuria and hematuria.  Skin: Negative.  Negative for rash.  Neurological: Negative.   Negative for dizziness and headaches.  All other systems reviewed and are negative.   Vitals:   06/04/23 1557  BP: (!) 184/110  Pulse: 93  Temp: 97.9 F (36.6 C)  SpO2: 95%    Physical Exam Vitals reviewed.  Constitutional:      Appearance: Normal appearance.  HENT:     Head: Normocephalic.  Eyes:     Extraocular Movements: Extraocular movements intact.  Cardiovascular:     Rate and Rhythm: Normal rate.  Pulmonary:     Effort: Pulmonary effort is normal.  Skin:    General: Skin is warm and dry.     Capillary Refill: Capillary refill takes less than 2 seconds.  Neurological:     Mental Status: She is alert and oriented to person, place, and time.  Psychiatric:        Behavior: Behavior normal.      ASSESSMENT & PLAN: A total of 33 minutes was spent with the patient and counseling/coordination of care regarding preparing for this visit, review of most recent office visit notes, review of multiple chronic medical conditions under management, review of all medications, differential diagnosis of vaginal discharge and need for gynecological evaluation, review of most recent blood work results, prognosis, documentation, and need for follow-up  Problem List Items Addressed This Visit       Cardiovascular and Mediastinum   HTN (hypertension)   Well-controlled hypertension Continue losartan 50 mg daily and metoprolol succinate 25 mg daily Cardiovascular risk associated  with hypertension discussed. Dietary approaches to stop hypertension discussed        Aortic atherosclerosis (HCC)   Diet and nutrition discussed Continue atorvastatin 40 mg daily          Respiratory   Centrilobular emphysema (HCC)   Stable.  Continues to smoke. Continue daily Trelegy      Relevant Medications   Fluticasone-Umeclidin-Vilant (TRELEGY ELLIPTA) 100-62.5-25 MCG/ACT AEPB     Other   Current smoker   Cardiovascular and cancer risks associated with smoking discussed Smoking  cessation advice given      Vaginal discharge - Primary   Abnormal and of recent onset Denies vaginal bleeding Denies pain Needs gynecological evaluation. Referral placed today.      Relevant Orders   Ambulatory referral to Gynecology   Patient Instructions  Health Maintenance After Age 88 After age 65, you are at a higher risk for certain long-term diseases and infections as well as injuries from falls. Falls are a major cause of broken bones and head injuries in people who are older than age 56. Getting regular preventive care can help to keep you healthy and well. Preventive care includes getting regular testing and making lifestyle changes as recommended by your health care provider. Talk with your health care provider about: Which screenings and tests you should have. A screening is a test that checks for a disease when you have no symptoms. A diet and exercise plan that is right for you. What should I know about screenings and tests to prevent falls? Screening and testing are the best ways to find a health problem early. Early diagnosis and treatment give you the best chance of managing medical conditions that are common after age 87. Certain conditions and lifestyle choices may make you more likely to have a fall. Your health care provider may recommend: Regular vision checks. Poor vision and conditions such as cataracts can make you more likely to have a fall. If you wear glasses, make sure to get your prescription updated if your vision changes. Medicine review. Work with your health care provider to regularly review all of the medicines you are taking, including over-the-counter medicines. Ask your health care provider about any side effects that may make you more likely to have a fall. Tell your health care provider if any medicines that you take make you feel dizzy or sleepy. Strength and balance checks. Your health care provider may recommend certain tests to check your strength  and balance while standing, walking, or changing positions. Foot health exam. Foot pain and numbness, as well as not wearing proper footwear, can make you more likely to have a fall. Screenings, including: Osteoporosis screening. Osteoporosis is a condition that causes the bones to get weaker and break more easily. Blood pressure screening. Blood pressure changes and medicines to control blood pressure can make you feel dizzy. Depression screening. You may be more likely to have a fall if you have a fear of falling, feel depressed, or feel unable to do activities that you used to do. Alcohol use screening. Using too much alcohol can affect your balance and may make you more likely to have a fall. Follow these instructions at home: Lifestyle Do not drink alcohol if: Your health care provider tells you not to drink. If you drink alcohol: Limit how much you have to: 0-1 drink a day for women. 0-2 drinks a day for men. Know how much alcohol is in your drink. In the [place] one drink equals one 12  oz bottle of beer (355 mL), one 5 oz glass of wine (148 mL), or one 1 oz glass of hard liquor (44 mL). Do not use any products that contain nicotine or tobacco. These products include cigarettes, chewing tobacco, and vaping devices, such as e-cigarettes. If you need help quitting, ask your health care provider. Activity  Follow a regular exercise program to stay fit. This will help you maintain your balance. Ask your health care provider what types of exercise are appropriate for you. If you need a cane or walker, use it as recommended by your health care provider. Wear supportive shoes that have nonskid soles. Safety  Remove any tripping hazards, such as rugs, cords, and clutter. Install safety equipment such as grab bars in bathrooms and safety rails on stairs. Keep rooms and walkways well-lit. General instructions Talk with your health care provider about your risks for falling. Tell your health  care provider if: You fall. Be sure to tell your health care provider about all falls, even ones that seem minor. You feel dizzy, tiredness (fatigue), or off-balance. Take over-the-counter and prescription medicines only as told by your health care provider. These include supplements. Eat a healthy diet and maintain a healthy weight. A healthy diet includes low-fat dairy products, low-fat (lean) meats, and fiber from whole grains, beans, and lots of fruits and vegetables. Stay current with your vaccines. Schedule regular health, dental, and eye exams. Summary Having a healthy lifestyle and getting preventive care can help to protect your health and wellness after age 52. Screening and testing are the best way to find a health problem early and help you avoid having a fall. Early diagnosis and treatment give you the best chance for managing medical conditions that are more common for people who are older than age 63. Falls are a major cause of broken bones and head injuries in people who are older than age 70. Take precautions to prevent a fall at home. Work with your health care provider to learn what changes you can make to improve your health and wellness and to prevent falls. This information is not intended to replace advice given to you by your health care provider. Make sure you discuss any questions you have with your health care provider. Document Revised: 09/10/2020 Document Reviewed: 09/10/2020 Elsevier Patient Education  2024 [employer]   Edwina Barth, MD [facility]

## 2023-08-11 ENCOUNTER — Ambulatory Visit: Payer: PPO | Admitting: Obstetrics and Gynecology

## 2023-09-22 ENCOUNTER — Ambulatory Visit: Admitting: Obstetrics and Gynecology

## 2023-10-07 ENCOUNTER — Ambulatory Visit (INDEPENDENT_AMBULATORY_CARE_PROVIDER_SITE_OTHER): Payer: PPO

## 2023-10-07 VITALS — Ht 65.0 in | Wt 102.8 lb

## 2023-10-07 DIAGNOSIS — E119 Type 2 diabetes mellitus without complications: Secondary | ICD-10-CM | POA: Diagnosis not present

## 2023-10-07 DIAGNOSIS — F17200 Nicotine dependence, unspecified, uncomplicated: Secondary | ICD-10-CM | POA: Diagnosis not present

## 2023-10-07 DIAGNOSIS — Z122 Encounter for screening for malignant neoplasm of respiratory organs: Secondary | ICD-10-CM

## 2023-10-07 DIAGNOSIS — Z0000 Encounter for general adult medical examination without abnormal findings: Secondary | ICD-10-CM | POA: Diagnosis not present

## 2023-10-07 NOTE — Progress Notes (Signed)
 Subjective:   Terry Armstrong is a 78 y.o. who presents for a Medicare Wellness preventive visit.  As a reminder, Annual Wellness Visits don't include a physical exam, and some assessments may be limited, especially if this visit is performed virtually. We may recommend an in-person follow-up visit with your provider if needed.  Visit Complete: Virtual I connected with  Carolynn Rae on 10/07/23 by a audio enabled telemedicine application and verified that I am speaking with the correct person using two identifiers.  Patient Location: Home  Provider Location: Office/Clinic  I discussed the limitations of evaluation and management by telemedicine. The patient expressed understanding and agreed to proceed.  Vital Signs: Because this visit was a virtual/telehealth visit, some criteria may be missing or patient reported. Any vitals not documented were not able to be obtained and vitals that have been documented are patient reported.  VideoDeclined- This patient declined Librarian, academic. Therefore the visit was completed with audio only.  Persons Participating in Visit: Patient.  AWV Questionnaire: No: Patient Medicare AWV questionnaire was not completed prior to this visit.  Cardiac Risk Factors include: advanced age (>37men, >75 women);diabetes mellitus;dyslipidemia;hypertension;smoking/ tobacco exposure     Objective:     Today's Vitals   10/07/23 1052  Weight: 102 lb 12.8 oz (46.6 kg)  Height: 5\' 5"  (1.651 m)   Body mass index is 17.11 kg/m.     10/07/2023   10:52 AM 10/06/2022   11:07 AM 02/17/2022   10:52 AM 07/26/2021   11:28 AM 01/15/2021    4:35 PM 06/19/2020    1:24 PM 09/11/2019    6:50 AM  Advanced Directives  Does Patient Have a Medical Advance Directive? No Yes No No No No Yes  Type of Furniture conservator/restorer;Living will     Healthcare Power of Attorney  Would patient like information on creating a medical advance  directive? No - Patient declined  No - Patient declined No - Patient declined No - Patient declined No - Patient declined     Current Medications (verified) Outpatient Encounter Medications as of 10/07/2023  Medication Sig   albuterol  (VENTOLIN  HFA) 108 (90 Base) MCG/ACT inhaler INHALE 2 PUFFS BY MOUTH EVERY 6 HOURS AS NEEDED FOR WHEEZING OR  SHORTNESS  OF  BREATH   alendronate  (FOSAMAX ) 70 MG tablet Take one tablet once weekly. Take with a full glass of water on an empty stomach.   aspirin  EC 81 MG tablet Take 1 tablet (81 mg total) by mouth daily.   atorvastatin  (LIPITOR) 40 MG tablet Take 1 tablet by mouth once daily   Fluticasone -Umeclidin-Vilant (TRELEGY ELLIPTA ) 100-62.5-25 MCG/ACT AEPB Inhale 1 puff into the lungs daily.   losartan  (COZAAR ) 50 MG tablet Take 1 tablet (50 mg total) by mouth daily.   metoprolol  succinate (TOPROL -XL) 25 MG 24 hr tablet Take half to whole tablet as needed in the evening for palpitations.   multivitamin-iron-minerals-folic acid (CENTRUM) chewable tablet Chew 1 tablet by mouth daily.   Omega 3 1000 MG CAPS Take by mouth daily.   No facility-administered encounter medications on file as of 10/07/2023.    Allergies (verified) Patient has no known allergies.   History: Past Medical History:  Diagnosis Date   Allergy    Breast abscess    Constipation    if drinks alot of water no issues - uses womens stool softener PRN only    Diabetes mellitus without complication (HCC)    pt has no meds-  diet controlled only    Exertional dyspnea 08/18/2019   H/O mammogram 05/06/2019   Per [facility] new patient packet   Hx of adenomatous colonic polyps 06/19/2017   Hypercholesteremia    Per [facility] new patient packet   Hyperlipidemia    Hypertension    Per [facility] new patient packet   Palpitations 08/18/2019   Primary snoring    Thyroid  condition    Per [facility] new patient packet   VT (ventricular tachycardia) (HCC) 09/11/2019   Past Surgical History:  Procedure Laterality Date    ABDOMINAL HYSTERECTOMY  1993   COLONOSCOPY     POLYPECTOMY     Family History  Problem Relation Age of Onset   Diabetes Mother    Diabetes Father    Diabetes Other    Hypertension Sister    Hyperlipidemia Other    Heart disease Sister        defibrillator   Kidney failure Maternal Aunt        dialysis   Diabetes Maternal Aunt    Hypertension Maternal Aunt    Diabetes Maternal Aunt    Heart Problems Maternal Grandmother    Cancer Maternal Grandmother    Heart disease Maternal Grandmother    Hypertension Maternal Grandmother    Diabetes Brother    Colon cancer Neg Hx    Colon polyps Neg Hx    Rectal cancer Neg Hx    Stomach cancer Neg Hx    Social History   Socioeconomic History   Marital status: Widowed    Spouse name: Not on file   Number of children: 0   Years of education: 14   Highest education level: Not on file  Occupational History    Employer: RETIRED  Tobacco Use   Smoking status: Every Day    Current packs/day: 1.50    Average packs/day: 1.5 packs/day for 49.0 years (73.5 ttl pk-yrs)    Types: Cigarettes   Smokeless tobacco: Never   Tobacco comments:    1/2 ppd 01/16/20        Patient is participating in health coaching for smoking cessation as of 04/13/23  Vaping Use   Vaping status: Never Used  Substance and Sexual Activity   Alcohol use: Yes    Alcohol/week: 1.0 standard drink of alcohol    Types: 1 Glasses of wine per week    Comment: 2-4 per week   Drug use: No   Sexual activity: Not Currently  Other Topics Concern   Not on file  Social History Narrative   Diet       Do you drink/eat things with caffeine Coffee      Marital Status Married What year were you married? 2015      Do you live in a house, apartment, assisted living, condo, trailer, etc.? Private House      Is it one or more stories? Yes 3 stories      How many persons live in your home? 2        Do you have any pets in your home?(please list) No      Highest level of  education completed: 2 years college      Current or past profession: [employer]      Do you exercise?: Yes    Type and how often: Daily      Do you have a Living Will? (Form that indicates scenarios where you would not want your life prolonged) No      Do you have  a DNR form?         If not, would you like to discuss one? No      Do you have signed POA/HPOA forms? No      Do you have difficulty bathing or dressing yourself? No      Do you have difficulty preparing food or eating? No      Do you have difficulty managing medications? No      Do you have difficulty managing your finances? No      Do you have difficulty affording your medications? No                     Social Drivers of Corporate investment banker Strain: Low Risk  (10/07/2023)   Overall Financial Resource Strain (CARDIA)    Difficulty of Paying Living Expenses: Not hard at all  Food Insecurity: No Food Insecurity (10/07/2023)   Hunger Vital Sign    Worried About Running Out of Food in the Last Year: Never true    Ran Out of Food in the Last Year: Never true  Transportation Needs: No Transportation Needs (10/07/2023)   PRAPARE - Administrator, Civil Service (Medical): No    Lack of Transportation (Non-Medical): No  Physical Activity: Insufficiently Active (10/07/2023)   Exercise Vital Sign    Days of Exercise per Week: 3 days    Minutes of Exercise per Session: 20 min  Stress: No Stress Concern Present (10/07/2023)   [employer] of Occupational Health - Occupational Stress Questionnaire    Feeling of Stress : Not at all  Social Connections: Moderately Isolated (10/07/2023)   Social Connection and Isolation Panel [NHANES]    Frequency of Communication with Friends and Family: More than three times a week    Frequency of Social Gatherings with Friends and Family: More than three times a week    Attends Religious Services: More than 4 times per year    Active Member of [employer] or Organizations:  No    Attends Banker Meetings: Never    Marital Status: Widowed    Tobacco Counseling Ready to quit: Yes Counseling given: Yes Tobacco comments: 1/2 ppd 01/16/20  Patient is participating in health coaching for smoking cessation as of 04/13/23    Clinical Intake:  Pre-visit preparation completed: Yes  Pain : No/denies pain     BMI - recorded: 17.11 Nutritional Status: BMI <19  Underweight Nutritional Risks: None Diabetes: No  Lab Results  Component Value Date   HGBA1C 6.0 11/25/2022   HGBA1C 5.9 05/21/2022   HGBA1C 5.5 01/21/2022     How often do you need to have someone help you when you read instructions, pamphlets, or other written materials from your doctor or pharmacy?: 1 - Never  Interpreter Needed?: No  Information entered by :: Kandy Orris, CMA   Activities of Daily Living     10/07/2023   10:56 AM  In your present state of health, do you have any difficulty performing the following activities:  Hearing? 0  Vision? 0  Difficulty concentrating or making decisions? 0  Walking or climbing stairs? 0  Dressing or bathing? 0  Doing errands, shopping? 0  Preparing Food and eating ? N  Using the Toilet? N  In the past six months, have you accidently leaked urine? N  Do you have problems with loss of bowel control? N  Managing your Medications? N  Managing your Finances? N  Housekeeping or managing  your Housekeeping? N    Patient Care Team: Elvira Hammersmith, MD as PCP - General (Internal Medicine) Maudine Sos, MD as PCP - Cardiology (Cardiology) Maris Sickle, MD as Consulting Physician (Ophthalmology) Merlyn Starring, Amy (Inactive) (Cardiology)  I have updated your Care Teams any recent Medical Services you may have received from other providers in the past year.     Assessment:    This is a routine wellness examination for Kita.  Hearing/Vision screen Hearing Screening - Comments:: Denies hearing difficulties   Vision  Screening - Comments:: Denies vision concerns - sees Dr Maris Sickle   Goals Addressed               This Visit's Progress     Patient Stated (pt-stated)        Patient stated she is trying to stop smoking by reducing the limit of cigs per day       Depression Screen     10/07/2023   10:57 AM 12/17/2022    2:47 PM 11/25/2022    9:52 AM 10/06/2022   11:05 AM 05/27/2022    3:48 PM 05/21/2022    8:42 AM 03/04/2022   10:29 AM  PHQ 2/9 Scores  PHQ - 2 Score 0 0 0 0 0 0 0  PHQ- 9 Score 0          Fall Risk     10/07/2023   10:56 AM 12/17/2022    2:47 PM 11/25/2022    9:52 AM 10/06/2022   11:03 AM 05/27/2022    3:48 PM  Fall Risk   Falls in the past year? 0 0 0 0 0  Number falls in past yr: 0 0 0 0 0  Injury with Fall? 0 0 0 0 0  Risk for fall due to : No Fall Risks No Fall Risks No Fall Risks No Fall Risks Impaired vision  Follow up Falls evaluation completed;Falls prevention discussed Falls evaluation completed Falls evaluation completed Falls prevention discussed;Education provided Falls evaluation completed    MEDICARE RISK AT HOME:  Medicare Risk at Home Any stairs in or around the home?: Yes (outside also) If so, are there any without handrails?: No Home free of loose throw rugs in walkways, pet beds, electrical cords, etc?: Yes Adequate lighting in your home to reduce risk of falls?: Yes Life alert?: No Use of a cane, walker or w/c?: No Grab bars in the bathroom?: Yes Shower chair or bench in shower?: Yes Elevated toilet seat or a handicapped toilet?: Yes  TIMED UP AND GO:  Was the test performed?  No  Cognitive Function: 6CIT completed        10/07/2023   11:01 AM 10/06/2022   11:09 AM 02/17/2022   10:53 AM 01/15/2021    4:34 PM 07/07/2019    1:36 PM  6CIT Screen  What Year? 0 points 0 points 0 points 0 points 0 points  What month? 0 points 0 points 0 points 0 points 0 points  What time? 0 points 0 points 0 points 0 points 0 points  Count back from 20 0 points 0  points 0 points 0 points 0 points  Months in reverse 0 points 0 points 0 points 0 points 0 points  Repeat phrase 2 points 0 points 0 points 0 points 0 points  Total Score 2 points 0 points 0 points 0 points 0 points    Immunizations Immunization History  Administered Date(s) Administered   Fluad Quad(high Dose 65+) 02/15/2019, 01/21/2022  Influenza Split 01/28/2012   Influenza, High Dose Seasonal PF 06/20/2016, 02/10/2018   Influenza,inj,Quad PF,6+ Mos 04/08/2013, 04/15/2014, 01/11/2015, 03/03/2017   Moderna Sars-Covid-2 Vaccination 06/16/2019, 07/19/2019, 04/12/2020   Pneumococcal Conjugate-13 07/26/2015   Pneumococcal Polysaccharide-23 11/14/2010   Td 06/28/2019   Tdap 10/11/2008   Zoster, Live 05/05/2014    Screening Tests Health Maintenance  Topic Date Due   Zoster Vaccines- Shingrix (1 of 2) 03/15/1995   OPHTHALMOLOGY EXAM  07/15/2018   Lung Cancer Screening  05/16/2021   FOOT EXAM  01/18/2022   Colonoscopy  06/11/2022   COVID-19 Vaccine (4 - 2024-25 season) 01/04/2023   Diabetic kidney evaluation - Urine ACR  01/22/2023   HEMOGLOBIN A1C  05/28/2023   Diabetic kidney evaluation - eGFR measurement  11/25/2023   INFLUENZA VACCINE  12/04/2023   Medicare Annual Wellness (AWV)  10/06/2024   DTaP/Tdap/Td (3 - Td or Tdap) 06/27/2029   Pneumonia Vaccine 29+ Years old  Completed   DEXA SCAN  Completed   Hepatitis C Screening  Completed   HPV VACCINES  Aged Out   Meningococcal B Vaccine  Aged Out    Health Maintenance  Health Maintenance Due  Topic Date Due   Zoster Vaccines- Shingrix (1 of 2) 03/15/1995   OPHTHALMOLOGY EXAM  07/15/2018   Lung Cancer Screening  05/16/2021   FOOT EXAM  01/18/2022   Colonoscopy  06/11/2022   COVID-19 Vaccine (4 - 2024-25 season) 01/04/2023   Diabetic kidney evaluation - Urine ACR  01/22/2023   HEMOGLOBIN A1C  05/28/2023   Health Maintenance Items Addressed:  Referral sent to [facility] (smoker/hx smoking), Referral sent to  Optometry/Ophthalmology  Additional Screening:  Vision Screening: Recommended annual ophthalmology exams for early detection of glaucoma and other disorders of the eye.  Referral sent to Dr Maris Sickle.   Dental Screening: Recommended annual dental exams for proper oral hygiene  Community Resource Referral / Chronic Care Management: CRR required this visit?  No   CCM required this visit?  No   Plan:    I have personally reviewed and noted the following in the patient's chart:   Medical and social history Use of alcohol, tobacco or illicit drugs  Current medications and supplements including opioid prescriptions. Patient is not currently taking opioid prescriptions. Functional ability and status Nutritional status Physical activity Advanced directives List of other physicians Hospitalizations, surgeries, and ER visits in previous 12 months Vitals Screenings to include cognitive, depression, and falls Referrals and appointments  In addition, I have reviewed and discussed with patient certain preventive protocols, quality metrics, and best practice recommendations. A written personalized care plan for preventive services as well as general preventive health recommendations were provided to patient.   Patria Bookbinder, CMA   10/07/2023   After Visit Summary: (MyChart) Due to this being a telephonic visit, the after visit summary with patients personalized plan was offered to patient via MyChart   Notes: Nothing significant to report at this time.

## 2023-10-07 NOTE — Patient Instructions (Signed)
 Terry Armstrong , Thank you for taking time out of your busy schedule to complete your Annual Wellness Visit with me. I enjoyed our conversation and look forward to speaking with you again next year. I, as well as your care team,  appreciate your ongoing commitment to your health goals. Please review the following plan we discussed and let me know if I can assist you in the future. Your Game plan/ To Do List    Referrals: If you haven't heard from the office you've been referred to, please reach out to them at the phone provided.  Referral to [facility] for a lung cancer screening; Referral to Dr Maris Sickle for an eye exam Follow up Visits: Next Medicare AWV with our clinical staff: 10/11/2024   Have you seen your provider in the last 6 months (3 months if uncontrolled diabetes)? Yes Next Office Visit with your provider: 10/12/2023   Clinician Recommendations:  Aim for 30 minutes of exercise or brisk walking, 6-8 glasses of water, and 5 servings of fruits and vegetables each day. Educated and advised on getting the COVID and Shingles vaccines in 2025.      This is a list of the screening recommended for you and due dates:  Health Maintenance  Topic Date Due   Zoster (Shingles) Vaccine (1 of 2) 03/15/1995   Eye exam for diabetics  07/15/2018   Screening for Lung Cancer  05/16/2021   Complete foot exam   01/18/2022   Colon Cancer Screening  06/11/2022   COVID-19 Vaccine (4 - 2024-25 season) 01/04/2023   Yearly kidney health urinalysis for diabetes  01/22/2023   Hemoglobin A1C  05/28/2023   Yearly kidney function blood test for diabetes  11/25/2023   Flu Shot  12/04/2023   Medicare Annual Wellness Visit  10/06/2024   DTaP/Tdap/Td vaccine (3 - Td or Tdap) 06/27/2029   Pneumonia Vaccine  Completed   DEXA scan (bone density measurement)  Completed   Hepatitis C Screening  Completed   HPV Vaccine  Aged Out   Meningitis B Vaccine  Aged Out    Advanced directives: (Declined) Advance  directive discussed with you today. Even though you declined this today, please call our office should you change your mind, and we can give you the proper paperwork for you to fill out. Advance Care Planning is important because it:  [x]  Makes sure you receive the medical care that is consistent with your values, goals, and preferences  [x]  It provides guidance to your family and loved ones and reduces their decisional burden about whether or not they are making the right decisions based on your wishes.  Follow the link provided in your after visit summary or read over the paperwork we have mailed to you to help you started getting your Advance Directives in place. If you need assistance in completing these, please reach out to us  so that we can help you!

## 2023-10-12 ENCOUNTER — Ambulatory Visit (INDEPENDENT_AMBULATORY_CARE_PROVIDER_SITE_OTHER): Admitting: Emergency Medicine

## 2023-10-12 ENCOUNTER — Ambulatory Visit: Payer: Self-pay | Admitting: Emergency Medicine

## 2023-10-12 ENCOUNTER — Encounter: Payer: Self-pay | Admitting: Emergency Medicine

## 2023-10-12 VITALS — BP 132/84 | HR 76 | Temp 97.5°F | Ht 65.0 in | Wt 102.0 lb

## 2023-10-12 DIAGNOSIS — J432 Centrilobular emphysema: Secondary | ICD-10-CM

## 2023-10-12 DIAGNOSIS — I700 Atherosclerosis of aorta: Secondary | ICD-10-CM

## 2023-10-12 DIAGNOSIS — E785 Hyperlipidemia, unspecified: Secondary | ICD-10-CM

## 2023-10-12 DIAGNOSIS — I10 Essential (primary) hypertension: Secondary | ICD-10-CM

## 2023-10-12 DIAGNOSIS — M810 Age-related osteoporosis without current pathological fracture: Secondary | ICD-10-CM

## 2023-10-12 DIAGNOSIS — L859 Epidermal thickening, unspecified: Secondary | ICD-10-CM | POA: Diagnosis not present

## 2023-10-12 DIAGNOSIS — D179 Benign lipomatous neoplasm, unspecified: Secondary | ICD-10-CM | POA: Diagnosis not present

## 2023-10-12 DIAGNOSIS — F17200 Nicotine dependence, unspecified, uncomplicated: Secondary | ICD-10-CM

## 2023-10-12 LAB — COMPREHENSIVE METABOLIC PANEL WITH GFR
ALT: 16 U/L (ref 0–35)
AST: 19 U/L (ref 0–37)
Albumin: 4.4 g/dL (ref 3.5–5.2)
Alkaline Phosphatase: 65 U/L (ref 39–117)
BUN: 11 mg/dL (ref 6–23)
CO2: 28 meq/L (ref 19–32)
Calcium: 9.7 mg/dL (ref 8.4–10.5)
Chloride: 105 meq/L (ref 96–112)
Creatinine, Ser: 0.66 mg/dL (ref 0.40–1.20)
GFR: 83.93 mL/min
Glucose, Bld: 77 mg/dL (ref 70–99)
Potassium: 4.2 meq/L (ref 3.5–5.1)
Sodium: 139 meq/L (ref 135–145)
Total Bilirubin: 0.7 mg/dL (ref 0.2–1.2)
Total Protein: 7.3 g/dL (ref 6.0–8.3)

## 2023-10-12 LAB — CBC WITH DIFFERENTIAL/PLATELET
Basophils Absolute: 0.1 10*3/uL (ref 0.0–0.1)
Basophils Relative: 1 % (ref 0.0–3.0)
Eosinophils Absolute: 0.2 10*3/uL (ref 0.0–0.7)
Eosinophils Relative: 3.3 % (ref 0.0–5.0)
HCT: 42.8 % (ref 36.0–46.0)
Hemoglobin: 14.4 g/dL (ref 12.0–15.0)
Lymphocytes Relative: 24.2 % (ref 12.0–46.0)
Lymphs Abs: 1.5 10*3/uL (ref 0.7–4.0)
MCHC: 33.8 g/dL (ref 30.0–36.0)
MCV: 96.5 fl (ref 78.0–100.0)
Monocytes Absolute: 0.6 10*3/uL (ref 0.1–1.0)
Monocytes Relative: 10.2 % (ref 3.0–12.0)
Neutro Abs: 3.8 10*3/uL (ref 1.4–7.7)
Neutrophils Relative %: 61.3 % (ref 43.0–77.0)
Platelets: 246 10*3/uL (ref 150.0–400.0)
RBC: 4.43 Mil/uL (ref 3.87–5.11)
RDW: 14.1 % (ref 11.5–15.5)
WBC: 6.1 10*3/uL (ref 4.0–10.5)

## 2023-10-12 LAB — HEMOGLOBIN A1C: Hgb A1c MFr Bld: 5.9 % (ref 4.6–6.5)

## 2023-10-12 LAB — LIPID PANEL
Cholesterol: 176 mg/dL (ref 0–200)
HDL: 71.5 mg/dL
LDL Cholesterol: 82 mg/dL (ref 0–99)
NonHDL: 104.48
Total CHOL/HDL Ratio: 2
Triglycerides: 110 mg/dL (ref 0.0–149.0)
VLDL: 22 mg/dL (ref 0.0–40.0)

## 2023-10-12 NOTE — Assessment & Plan Note (Signed)
 Presently not taking atorvastatin . Also has history of aortic atherosclerosis Resume atorvastatin  40 mg daily.

## 2023-10-12 NOTE — Progress Notes (Signed)
 Terry Armstrong 78 y.o.   Chief Complaint  Patient presents with   Follow-up    5 month f/u for HTN. Patient mentions finding a lump on the right side of her back/ rib, no pain to it, she just noticed it last week. Patient also mentions having some SOB that comes and goes when she is doing something. She also mentions her hand still itches and has the black spots on her hands    HISTORY OF PRESENT ILLNESS: This is a 78 y.o. female A1A here for 92-month follow-up of chronic medical conditions Overall doing well.  Complaining of soft nontender mass to right lateral chest Also noticed dark spots on her hands that have been there for years with occasional itching. No other complaints or medical concerns today.   HPI   Prior to Admission medications   Medication Sig Start Date End Date Taking? Authorizing Provider  albuterol  (VENTOLIN  HFA) 108 (90 Base) MCG/ACT inhaler INHALE 2 PUFFS BY MOUTH EVERY 6 HOURS AS NEEDED FOR WHEEZING OR  SHORTNESS  OF  BREATH 04/06/23  Yes Maudine Sos, MD  alendronate  (FOSAMAX ) 70 MG tablet Take one tablet once weekly. Take with a full glass of water on an empty stomach. 06/19/20  Yes Ngetich, Dinah C, NP  aspirin  EC 81 MG tablet Take 1 tablet (81 mg total) by mouth daily. 06/28/19  Yes Stallings, Zoe A, MD  atorvastatin  (LIPITOR) 40 MG tablet Take 1 tablet by mouth once daily 03/05/23  Yes Donnelle Olmeda, Isidro Margo, MD  Fluticasone -Umeclidin-Vilant (TRELEGY ELLIPTA ) 100-62.5-25 MCG/ACT AEPB Inhale 1 puff into the lungs daily. 06/04/23  Yes Tylah Mancillas, Isidro Margo, MD  losartan  (COZAAR ) 50 MG tablet Take 1 tablet (50 mg total) by mouth daily. 05/21/22  Yes Makailyn Mccormick, Isidro Margo, MD  metoprolol  succinate (TOPROL -XL) 25 MG 24 hr tablet Take half to whole tablet as needed in the evening for palpitations. 09/19/22  Yes Clearnce Curia, NP  multivitamin-iron-minerals-folic acid (CENTRUM) chewable tablet Chew 1 tablet by mouth daily.   Yes [provider]  Omega 3  1000 MG CAPS Take by mouth daily.   Yes [provider]    No Known Allergies  Patient Active Problem List   Diagnosis Date Noted   Vaginal discharge 06/04/2023   Lower urinary tract symptoms 12/17/2022   Situational mixed anxiety and depressive disorder 01/21/2022   Aortic atherosclerosis (HCC) 06/19/2020   Hx of adenomatous colonic polyps 06/19/2017   Multinodular goiter 03/15/2017   Centrilobular emphysema (HCC) 11/09/2016   Periodic limb movement sleep disorder 01/11/2015   Upper airway resistance syndrome 01/11/2015   Mild obstructive sleep apnea 01/11/2015   Osteoporosis 01/10/2015   Non-restorative sleep 05/23/2013   Primary snoring    HTN (hypertension) 05/11/2013   DM (diabetes mellitus) (HCC) 06/04/2011   Current smoker 06/04/2011   Hyperlipidemia LDL goal <70 06/04/2011    Past Medical History:  Diagnosis Date   Allergy    Breast abscess    Constipation    if drinks alot of water no issues - uses womens stool softener PRN only    Diabetes mellitus without complication (HCC)    pt has no meds- diet controlled only    Exertional dyspnea 08/18/2019   H/O mammogram 05/06/2019   Per [facility] new patient packet   Hx of adenomatous colonic polyps 06/19/2017   Hypercholesteremia    Per [facility] new patient packet   Hyperlipidemia    Hypertension    Per [facility] new patient packet   Palpitations 08/18/2019  Primary snoring    Thyroid  condition    Per [facility] new patient packet   VT (ventricular tachycardia) (HCC) 09/11/2019    Past Surgical History:  Procedure Laterality Date   ABDOMINAL HYSTERECTOMY  1993   COLONOSCOPY     POLYPECTOMY      Social History   Socioeconomic History   Marital status: Widowed    Spouse name: Not on file   Number of children: 0   Years of education: 14   Highest education level: Not on file  Occupational History    Employer: RETIRED  Tobacco Use   Smoking status: Every Day    Current packs/day: 1.50    Average packs/day: 1.5  packs/day for 49.0 years (73.5 ttl pk-yrs)    Types: Cigarettes   Smokeless tobacco: Never   Tobacco comments:    1/2 ppd 01/16/20        Patient is participating in health coaching for smoking cessation as of 04/13/23  Vaping Use   Vaping status: Never Used  Substance and Sexual Activity   Alcohol use: Yes    Alcohol/week: 1.0 standard drink of alcohol    Types: 1 Glasses of wine per week    Comment: 2-4 per week   Drug use: No   Sexual activity: Not Currently  Other Topics Concern   Not on file  Social History Narrative   Diet       Do you drink/eat things with caffeine Coffee      Marital Status Married What year were you married? 2015      Do you live in a house, apartment, assisted living, condo, trailer, etc.? Private House      Is it one or more stories? Yes 3 stories      How many persons live in your home? 2        Do you have any pets in your home?(please list) No      Highest level of education completed: 2 years college      Current or past profession: [employer]      Do you exercise?: Yes    Type and how often: Daily      Do you have a Living Will? (Form that indicates scenarios where you would not want your life prolonged) No      Do you have a DNR form?         If not, would you like to discuss one? No      Do you have signed POA/HPOA forms? No      Do you have difficulty bathing or dressing yourself? No      Do you have difficulty preparing food or eating? No      Do you have difficulty managing medications? No      Do you have difficulty managing your finances? No      Do you have difficulty affording your medications? No                     Social Drivers of Corporate investment banker Strain: Low Risk  (10/07/2023)   Overall Financial Resource Strain (CARDIA)    Difficulty of Paying Living Expenses: Not hard at all  Food Insecurity: No Food Insecurity (10/07/2023)   Hunger Vital Sign    Worried About Running Out of Food in the Last  Year: Never true    Ran Out of Food in the Last Year: Never true  Transportation Needs: No Transportation Needs (10/07/2023)  PRAPARE - Administrator, Civil Service (Medical): No    Lack of Transportation (Non-Medical): No  Physical Activity: Insufficiently Active (10/07/2023)   Exercise Vital Sign    Days of Exercise per Week: 3 days    Minutes of Exercise per Session: 20 min  Stress: No Stress Concern Present (10/07/2023)   [employer] of Occupational Health - Occupational Stress Questionnaire    Feeling of Stress : Not at all  Social Connections: Moderately Isolated (10/07/2023)   Social Connection and Isolation Panel [NHANES]    Frequency of Communication with Friends and Family: More than three times a week    Frequency of Social Gatherings with Friends and Family: More than three times a week    Attends Religious Services: More than 4 times per year    Active Member of [employer] or Organizations: No    Attends Banker Meetings: Never    Marital Status: Widowed  Intimate Partner Violence: Not At Risk (10/07/2023)   Humiliation, Afraid, Rape, and Kick questionnaire    Fear of Current or Ex-Partner: No    Emotionally Abused: No    Physically Abused: No    Sexually Abused: No    Family History  Problem Relation Age of Onset   Diabetes Mother    Diabetes Father    Diabetes Other    Hypertension Sister    Hyperlipidemia Other    Heart disease Sister        defibrillator   Kidney failure Maternal Aunt        dialysis   Diabetes Maternal Aunt    Hypertension Maternal Aunt    Diabetes Maternal Aunt    Heart Problems Maternal Grandmother    Cancer Maternal Grandmother    Heart disease Maternal Grandmother    Hypertension Maternal Grandmother    Diabetes Brother    Colon cancer Neg Hx    Colon polyps Neg Hx    Rectal cancer Neg Hx    Stomach cancer Neg Hx      Review of Systems  Constitutional: Negative.  Negative for chills and fever.  HENT:  Negative.  Negative for congestion and sore throat.   Respiratory: Negative.  Negative for cough and shortness of breath.   Cardiovascular: Negative.  Negative for chest pain and palpitations.  Gastrointestinal:  Negative for abdominal pain, diarrhea, nausea and vomiting.  Genitourinary: Negative.  Negative for dysuria and hematuria.  Skin:  Positive for rash.  Neurological: Negative.  Negative for dizziness and headaches.  All other systems reviewed and are negative.   Vitals:   10/12/23 0902  BP: 132/84  Pulse: 76  Temp: (!) 97.5 F (36.4 C)  SpO2: 96%    Physical Exam Vitals reviewed.  Constitutional:      Appearance: Normal appearance.  HENT:     Head: Normocephalic.     Mouth/Throat:     Mouth: Mucous membranes are moist.     Pharynx: Oropharynx is clear.  Eyes:     Extraocular Movements: Extraocular movements intact.     Conjunctiva/sclera: Conjunctivae normal.     Pupils: Pupils are equal, round, and reactive to light.  Cardiovascular:     Rate and Rhythm: Normal rate and regular rhythm.     Pulses: Normal pulses.     Heart sounds: Normal heart sounds.  Pulmonary:     Effort: Pulmonary effort is normal.     Breath sounds: Normal breath sounds.  Musculoskeletal:     Cervical back: No tenderness.  Lymphadenopathy:     Cervical: No cervical adenopathy.  Skin:    General: Skin is warm and dry.     Findings: Lesion present.     Comments: Large lipoma right lateral chest  Neurological:     Mental Status: She is alert and oriented to person, place, and time.  Psychiatric:        Mood and Affect: Mood normal.        Behavior: Behavior normal.      ASSESSMENT & PLAN: A total of 43 minutes was spent with the patient and counseling/coordination of care regarding preparing for this visit, review of most recent office visit notes, review of multiple chronic medical conditions and their management, review of all medications, review of most recent bloodwork results,  review of health maintenance items, education on nutrition, prognosis, documentation, and need for follow up.   Problem List Items Addressed This Visit       Cardiovascular and Mediastinum   HTN (hypertension) - Primary   Well-controlled hypertension Continue losartan  50 mg daily and metoprolol  succinate 25 mg daily Cardiovascular risk associated with hypertension discussed. Dietary approaches to stop hypertension discussed      Relevant Orders   CBC with Differential/Platelet   Comprehensive metabolic panel with GFR   Hemoglobin A1c   Lipid panel   Aortic atherosclerosis (HCC)   Diet and nutrition discussed Continue atorvastatin  40 mg daily        Relevant Orders   Lipid panel     Respiratory   Centrilobular emphysema (HCC)   Stable.  Continues to smoke. Continue daily Trelegy        Musculoskeletal and Integument   Osteoporosis   Continues weekly Fosamax  70 mg      Hyperkeratotic hand dermatitis   Couple of hyperpigmented hyperkeratotic spots to both hands that have been there for years.  No concerns.        Other   Current smoker   Cardiovascular and cancer risks associated with smoking discussed Smoking cessation advice given      Hyperlipidemia LDL goal <70   Presently not taking atorvastatin . Also has history of aortic atherosclerosis Resume atorvastatin  40 mg daily.      Relevant Orders   Lipid panel   Atypical lipoma of soft tissue   Large lipoma.  Recommend soft tissue ultrasound to further delineate. Nontender.  No infection.  No fluctuation.       Relevant Orders   US  CHEST SOFT TISSUE   Patient Instructions  Health Maintenance After Age 85 After age 68, you are at a higher risk for certain long-term diseases and infections as well as injuries from falls. Falls are a major cause of broken bones and head injuries in people who are older than age 96. Getting regular preventive care can help to keep you healthy and well. Preventive care  includes getting regular testing and making lifestyle changes as recommended by your health care provider. Talk with your health care provider about: Which screenings and tests you should have. A screening is a test that checks for a disease when you have no symptoms. A diet and exercise plan that is right for you. What should I know about screenings and tests to prevent falls? Screening and testing are the best ways to find a health problem early. Early diagnosis and treatment give you the best chance of managing medical conditions that are common after age 65. Certain conditions and lifestyle choices may make you more likely to have a fall.  Your health care provider may recommend: Regular vision checks. Poor vision and conditions such as cataracts can make you more likely to have a fall. If you wear glasses, make sure to get your prescription updated if your vision changes. Medicine review. Work with your health care provider to regularly review all of the medicines you are taking, including over-the-counter medicines. Ask your health care provider about any side effects that may make you more likely to have a fall. Tell your health care provider if any medicines that you take make you feel dizzy or sleepy. Strength and balance checks. Your health care provider may recommend certain tests to check your strength and balance while standing, walking, or changing positions. Foot health exam. Foot pain and numbness, as well as not wearing proper footwear, can make you more likely to have a fall. Screenings, including: Osteoporosis screening. Osteoporosis is a condition that causes the bones to get weaker and break more easily. Blood pressure screening. Blood pressure changes and medicines to control blood pressure can make you feel dizzy. Depression screening. You may be more likely to have a fall if you have a fear of falling, feel depressed, or feel unable to do activities that you used to do. Alcohol  use screening. Using too much alcohol can affect your balance and may make you more likely to have a fall. Follow these instructions at home: Lifestyle Do not drink alcohol if: Your health care provider tells you not to drink. If you drink alcohol: Limit how much you have to: 0-1 drink a day for women. 0-2 drinks a day for men. Know how much alcohol is in your drink. In the [place] one drink equals one 12 oz bottle of beer (355 mL), one 5 oz glass of wine (148 mL), or one 1 oz glass of hard liquor (44 mL). Do not use any products that contain nicotine  or tobacco. These products include cigarettes, chewing tobacco, and vaping devices, such as e-cigarettes. If you need help quitting, ask your health care provider. Activity  Follow a regular exercise program to stay fit. This will help you maintain your balance. Ask your health care provider what types of exercise are appropriate for you. If you need a cane or walker, use it as recommended by your health care provider. Wear supportive shoes that have nonskid soles. Safety  Remove any tripping hazards, such as rugs, cords, and clutter. Install safety equipment such as grab bars in bathrooms and safety rails on stairs. Keep rooms and walkways well-lit. General instructions Talk with your health care provider about your risks for falling. Tell your health care provider if: You fall. Be sure to tell your health care provider about all falls, even ones that seem minor. You feel dizzy, tiredness (fatigue), or off-balance. Take over-the-counter and prescription medicines only as told by your health care provider. These include supplements. Eat a healthy diet and maintain a healthy weight. A healthy diet includes low-fat dairy products, low-fat (lean) meats, and fiber from whole grains, beans, and lots of fruits and vegetables. Stay current with your vaccines. Schedule regular health, dental, and eye exams. Summary Having a healthy lifestyle and  getting preventive care can help to protect your health and wellness after age 55. Screening and testing are the best way to find a health problem early and help you avoid having a fall. Early diagnosis and treatment give you the best chance for managing medical conditions that are more common for people who are older than age 36.  Falls are a major cause of broken bones and head injuries in people who are older than age 20. Take precautions to prevent a fall at home. Work with your health care provider to learn what changes you can make to improve your health and wellness and to prevent falls. This information is not intended to replace advice given to you by your health care provider. Make sure you discuss any questions you have with your health care provider. Document Revised: 09/10/2020 Document Reviewed: 09/10/2020 Elsevier Patient Education  2024 Elsevier Inc.     Maryagnes Small, MD [facility]

## 2023-10-12 NOTE — Assessment & Plan Note (Signed)
 Well-controlled hypertension Continue losartan 50 mg daily and metoprolol succinate 25 mg daily Cardiovascular risk associated with hypertension discussed. Dietary approaches to stop hypertension discussed

## 2023-10-12 NOTE — Patient Instructions (Signed)
 Health Maintenance After Age 79 After age 4, you are at a higher risk for certain long-term diseases and infections as well as injuries from falls. Falls are a major cause of broken bones and head injuries in people who are older than age 47. Getting regular preventive care can help to keep you healthy and well. Preventive care includes getting regular testing and making lifestyle changes as recommended by your health care provider. Talk with your health care provider about: Which screenings and tests you should have. A screening is a test that checks for a disease when you have no symptoms. A diet and exercise plan that is right for you. What should I know about screenings and tests to prevent falls? Screening and testing are the best ways to find a health problem early. Early diagnosis and treatment give you the best chance of managing medical conditions that are common after age 37. Certain conditions and lifestyle choices may make you more likely to have a fall. Your health care provider may recommend: Regular vision checks. Poor vision and conditions such as cataracts can make you more likely to have a fall. If you wear glasses, make sure to get your prescription updated if your vision changes. Medicine review. Work with your health care provider to regularly review all of the medicines you are taking, including over-the-counter medicines. Ask your health care provider about any side effects that may make you more likely to have a fall. Tell your health care provider if any medicines that you take make you feel dizzy or sleepy. Strength and balance checks. Your health care provider may recommend certain tests to check your strength and balance while standing, walking, or changing positions. Foot health exam. Foot pain and numbness, as well as not wearing proper footwear, can make you more likely to have a fall. Screenings, including: Osteoporosis screening. Osteoporosis is a condition that causes  the bones to get weaker and break more easily. Blood pressure screening. Blood pressure changes and medicines to control blood pressure can make you feel dizzy. Depression screening. You may be more likely to have a fall if you have a fear of falling, feel depressed, or feel unable to do activities that you used to do. Alcohol use screening. Using too much alcohol can affect your balance and may make you more likely to have a fall. Follow these instructions at home: Lifestyle Do not drink alcohol if: Your health care provider tells you not to drink. If you drink alcohol: Limit how much you have to: 0-1 drink a day for women. 0-2 drinks a day for men. Know how much alcohol is in your drink. In the [place] one drink equals one 12 oz bottle of beer (355 mL), one 5 oz glass of wine (148 mL), or one 1 oz glass of hard liquor (44 mL). Do not use any products that contain nicotine or tobacco. These products include cigarettes, chewing tobacco, and vaping devices, such as e-cigarettes. If you need help quitting, ask your health care provider. Activity  Follow a regular exercise program to stay fit. This will help you maintain your balance. Ask your health care provider what types of exercise are appropriate for you. If you need a cane or walker, use it as recommended by your health care provider. Wear supportive shoes that have nonskid soles. Safety  Remove any tripping hazards, such as rugs, cords, and clutter. Install safety equipment such as grab bars in bathrooms and safety rails on stairs. Keep rooms and walkways  well-lit. General instructions Talk with your health care provider about your risks for falling. Tell your health care provider if: You fall. Be sure to tell your health care provider about all falls, even ones that seem minor. You feel dizzy, tiredness (fatigue), or off-balance. Take over-the-counter and prescription medicines only as told by your health care provider. These include  supplements. Eat a healthy diet and maintain a healthy weight. A healthy diet includes low-fat dairy products, low-fat (lean) meats, and fiber from whole grains, beans, and lots of fruits and vegetables. Stay current with your vaccines. Schedule regular health, dental, and eye exams. Summary Having a healthy lifestyle and getting preventive care can help to protect your health and wellness after age 11. Screening and testing are the best way to find a health problem early and help you avoid having a fall. Early diagnosis and treatment give you the best chance for managing medical conditions that are more common for people who are older than age 28. Falls are a major cause of broken bones and head injuries in people who are older than age 48. Take precautions to prevent a fall at home. Work with your health care provider to learn what changes you can make to improve your health and wellness and to prevent falls. This information is not intended to replace advice given to you by your health care provider. Make sure you discuss any questions you have with your health care provider. Document Revised: 09/10/2020 Document Reviewed: 09/10/2020 Elsevier Patient Education  2024 [employer].

## 2023-10-12 NOTE — Assessment & Plan Note (Signed)
 Cardiovascular and cancer risks associated with smoking discussed. Smoking cessation advice given.

## 2023-10-12 NOTE — Assessment & Plan Note (Signed)
 Couple of hyperpigmented hyperkeratotic spots to both hands that have been there for years.  No concerns.

## 2023-10-12 NOTE — Assessment & Plan Note (Signed)
 Stable.  Continues to smoke. Continue daily Trelegy

## 2023-10-12 NOTE — Assessment & Plan Note (Signed)
 Large lipoma.  Recommend soft tissue ultrasound to further delineate. Nontender.  No infection.  No fluctuation.

## 2023-10-12 NOTE — Assessment & Plan Note (Signed)
Continues weekly Fosamax 70 mg

## 2023-10-12 NOTE — Assessment & Plan Note (Signed)
 Diet and nutrition discussed. Continue atorvastatin 40 mg daily.

## 2023-10-15 ENCOUNTER — Ambulatory Visit
Admission: RE | Admit: 2023-10-15 | Discharge: 2023-10-15 | Disposition: A | Discharge status: Home / Self Care | Source: Ambulatory Visit | Attending: Emergency Medicine | Admitting: Emergency Medicine

## 2023-10-15 DIAGNOSIS — D179 Benign lipomatous neoplasm, unspecified: Secondary | ICD-10-CM

## 2023-11-12 ENCOUNTER — Other Ambulatory Visit (HOSPITAL_BASED_OUTPATIENT_CLINIC_OR_DEPARTMENT_OTHER): Payer: Self-pay | Admitting: Cardiovascular Disease

## 2023-11-12 DIAGNOSIS — J432 Centrilobular emphysema: Secondary | ICD-10-CM

## 2023-11-13 ENCOUNTER — Encounter (HOSPITAL_BASED_OUTPATIENT_CLINIC_OR_DEPARTMENT_OTHER): Payer: Self-pay | Admitting: *Deleted

## 2023-11-13 NOTE — Telephone Encounter (Signed)
 Pt of Dr. Raford. Please advise on this RX.

## 2023-11-13 NOTE — Telephone Encounter (Signed)
 Mychart message sent to patient needs to get from PCP or pulmonologist  Left message on [employer] machine to contact above

## 2023-11-17 ENCOUNTER — Other Ambulatory Visit: Payer: Self-pay | Admitting: Emergency Medicine

## 2023-11-17 DIAGNOSIS — J432 Centrilobular emphysema: Secondary | ICD-10-CM

## 2023-12-03 ENCOUNTER — Other Ambulatory Visit: Payer: Self-pay | Admitting: Emergency Medicine

## 2023-12-03 DIAGNOSIS — I10 Essential (primary) hypertension: Secondary | ICD-10-CM

## 2023-12-07 ENCOUNTER — Encounter: Payer: Self-pay | Admitting: Emergency Medicine

## 2023-12-07 ENCOUNTER — Ambulatory Visit (INDEPENDENT_AMBULATORY_CARE_PROVIDER_SITE_OTHER): Admitting: Emergency Medicine

## 2023-12-07 VITALS — BP 138/76 | HR 96 | Temp 98.3°F | Ht 65.0 in | Wt 103.0 lb

## 2023-12-07 DIAGNOSIS — B9689 Other specified bacterial agents as the cause of diseases classified elsewhere: Secondary | ICD-10-CM | POA: Insufficient documentation

## 2023-12-07 DIAGNOSIS — N760 Acute vaginitis: Secondary | ICD-10-CM | POA: Diagnosis not present

## 2023-12-07 DIAGNOSIS — R300 Dysuria: Secondary | ICD-10-CM

## 2023-12-07 DIAGNOSIS — J432 Centrilobular emphysema: Secondary | ICD-10-CM | POA: Diagnosis not present

## 2023-12-07 LAB — POC URINALSYSI DIPSTICK (AUTOMATED)
Bilirubin, UA: NEGATIVE
Blood, UA: NEGATIVE
Glucose, UA: NEGATIVE
Ketones, UA: NEGATIVE
Leukocytes, UA: NEGATIVE
Protein, UA: POSITIVE — AB
Spec Grav, UA: >=1.03 — AB (ref 1.010–1.025)
Urobilinogen, UA: 0.2 U/dL
pH, UA: 5.5 (ref 5.0–8.0)

## 2023-12-07 MED ORDER — METRONIDAZOLE 500 MG PO TABS: 500.0000 mg | ORAL_TABLET | Freq: Two times a day (BID) | ORAL | 0 refills | Status: AC

## 2023-12-07 MED ORDER — TRELEGY ELLIPTA 100-62.5-25 MCG/ACT IN AEPB: 1.0000 | INHALATION_SPRAY | Freq: Every day | RESPIRATORY_TRACT | 3 refills | Status: AC

## 2023-12-07 NOTE — Patient Instructions (Signed)
 Vaginal Infection (Bacterial Vaginosis): What to Know  Bacterial vaginosis is an infection of the vagina. It happens when the balance of normal germs (bacteria) in the vagina changes. If you don't get treated, it can make it easier for you to get other infections from sex. These are called sexually transmitted infections (STIs). If you're pregnant, you need to get treated right away. This infection can cause a baby to be born early or at a low birth weight. What are the causes? This infection happens when too many harmful germs grow in the vagina. You can't get this infection from toilet seats, bedsheets, swimming pools, or things that touch your vagina. What increases the risk? Having sex with a new person or more than one person. Having sex without protection. Douching. Having an intrauterine device (IUD). Smoking. Using drugs or drinking alcohol. These can lead you to do risky things. Taking certain antibiotics. Being pregnant. What are the signs or symptoms? Some females have no symptoms. Symptoms may include: A gray or white discharge from your vagina. It can be watery or foamy. A fishy smell. This can happen after sex or during your menstrual period. Itching in and around your vagina. Burning or pain when you pee. How is this treated? This infection is treated with antibiotics. These may be given to you as: A pill. A cream for your vagina. A medicine that you put into your vagina (suppository). If the infection comes back, you may need more antibiotics. Follow these instructions at home: Medicines Take your medicines as told. Take or use your antibiotics as told. Do not stop using them even if you start to feel better. General instructions If the person you have sex with is a female, tell her that you have this infection. She will need to follow up with her doctor. Female partners don't need to be treated. Do not have sex until you finish treatment. Drink more fluids as  told. Keep your vagina and butt clean. Wash these areas with warm water each day. Wipe from front to back after you poop. If you're breastfeeding a baby, talk to your doctor if you should keep doing so during treatment. How is this prevented? Self-care Do not douche. Do not use deodorant sprays on your vagina. Wear cotton underwear. Do not wear tight pants and pantyhose, especially in the summer. Safe sex Use condoms the correct way and every time you have sex. Use dental dams to protect yourself during oral sex. Limit how many people you have sex with. Get tested for STIs. The person you have sex with should also get tested. Drugs and alcohol Do not smoke, vape, or use nicotine or tobacco. Do not use drugs. Limit the amount of alcohol you drink because it can lead you to do risky things. Where to find more information To learn more: Go to TonerPromos.no. Click Health Topics A-Z. Type "bacterial vaginosis" in the search bar. American Sexual Health Association (ASHA): ashasexualhealth.org U.S. Department of Health and [employer], Office on Women's Health: TravelLesson.ca Contact a doctor if: Your symptoms don't get better, even after treatment. You have more discharge or pain when you pee. You have a fever or chills. You have pain in your belly or in the area between your hips. You have pain during sex. You bleed from your vagina between menstrual periods. This information is not intended to replace advice given to you by your health care provider. Make sure you discuss any questions you have with your health care provider. Document  Revised: 10/08/2022 Document Reviewed: 10/08/2022 Elsevier Patient Education  2024 [employer].

## 2023-12-07 NOTE — Assessment & Plan Note (Signed)
 Clinically stable.  Not sexually active. Recommend to start Flagyl  500 mg twice a day for 7 days May need gynecological evaluation Advised to rest and stay well-hydrated Advised to contact the office if no better or worse during the next several days

## 2023-12-07 NOTE — Assessment & Plan Note (Signed)
 Improved today.  Unremarkable urinalysis but may be partially treated infection.  Urine sent for culture today. Advised to rest and stay well-hydrated

## 2023-12-07 NOTE — Progress Notes (Signed)
 Terry Armstrong 78 y.o.   Chief Complaint  Patient presents with   Vaginal Discharge    Patient here for vaginal discharge that started a week ago. Patient states she was taking AZO and monistat which helped for a little but today the vaginal discharge that is yellow and does have an odor. She does not have urgencies, no itching, she is having some burning,     HISTORY OF PRESENT ILLNESS: This is a 78 y.o. female complaining of burning on urination last week with a feeling of vaginal dampness.  Started taking cranberry juice and over-the-counter Azo.  Also took Monistat 1.  Today started with smelly yellowish discharge.  Denies vaginal bleeding.  Not sexually active. Denies fever or chills.  Denies nausea or vomiting.  Urinary symptoms much improved. No other complaints or medical concerns today.  Vaginal Discharge The patient's primary symptoms include vaginal discharge. Associated symptoms include dysuria. Pertinent negatives include no abdominal pain, chills, diarrhea, fever, headaches, nausea, rash, sore throat or vomiting.     Prior to Admission medications   Medication Sig Start Date End Date Taking? Authorizing Provider  albuterol  (VENTOLIN  HFA) 108 (90 Base) MCG/ACT inhaler INHALE 2 PUFFS BY MOUTH EVERY 6 HOURS AS NEEDED FOR WHEEZING FOR SHORTNESS OF BREATH 11/17/23  Yes Aristides Luckey, Emil Schanz, MD  alendronate  (FOSAMAX ) 70 MG tablet Take one tablet once weekly. Take with a full glass of water on an empty stomach. 06/19/20  Yes Ngetich, Dinah C, NP  aspirin  EC 81 MG tablet Take 1 tablet (81 mg total) by mouth daily. 06/28/19  Yes Stallings, Zoe A, MD  atorvastatin  (LIPITOR) 40 MG tablet Take 1 tablet by mouth once daily 03/05/23  Yes Adriane Gabbert, Emil Schanz, MD  losartan  (COZAAR ) 50 MG tablet Take 1 tablet by mouth once daily 12/03/23  Yes Latash Nouri Jose, MD  metoprolol  succinate (TOPROL -XL) 25 MG 24 hr tablet Take half to whole tablet as needed in the evening for palpitations.  09/19/22  Yes Vannie Reche RAMAN, NP  metroNIDAZOLE  (FLAGYL ) 500 MG tablet Take 1 tablet (500 mg total) by mouth 2 (two) times daily for 7 days. 12/07/23 12/14/23 Yes Oakley Kossman Jose, MD  multivitamin-iron-minerals-folic acid (CENTRUM) chewable tablet Chew 1 tablet by mouth daily.   Yes [provider]  Omega 3 1000 MG CAPS Take by mouth daily.   Yes [provider]  Fluticasone -Umeclidin-Vilant (TRELEGY ELLIPTA ) 100-62.5-25 MCG/ACT AEPB Inhale 1 puff into the lungs daily. 12/07/23   Purcell Emil Schanz, MD    No Known Allergies  Patient Active Problem List   Diagnosis Date Noted   Bacterial vaginosis 12/07/2023   Atypical lipoma of soft tissue 10/12/2023   Hyperkeratotic hand dermatitis 10/12/2023   Dysuria 05/27/2022   Situational mixed anxiety and depressive disorder 01/21/2022   Aortic atherosclerosis (HCC) 06/19/2020   Hx of adenomatous colonic polyps 06/19/2017   Multinodular goiter 03/15/2017   Centrilobular emphysema (HCC) 11/09/2016   Periodic limb movement sleep disorder 01/11/2015   Upper airway resistance syndrome 01/11/2015   Mild obstructive sleep apnea 01/11/2015   Osteoporosis 01/10/2015   Non-restorative sleep 05/23/2013   Primary snoring    HTN (hypertension) 05/11/2013   DM (diabetes mellitus) (HCC) 06/04/2011   Current smoker 06/04/2011   Hyperlipidemia LDL goal <70 06/04/2011    Past Medical History:  Diagnosis Date   Allergy    Breast abscess    Constipation    if drinks alot of water no issues - uses womens stool softener PRN only  Diabetes mellitus without complication (HCC)    pt has no meds- diet controlled only    Exertional dyspnea 08/18/2019   H/O mammogram 05/06/2019   Per [facility] new patient packet   Hx of adenomatous colonic polyps 06/19/2017   Hypercholesteremia    Per [facility] new patient packet   Hyperlipidemia    Hypertension    Per [facility] new patient packet   Palpitations 08/18/2019   Primary snoring    Thyroid  condition     Per [facility] new patient packet   VT (ventricular tachycardia) (HCC) 09/11/2019    Past Surgical History:  Procedure Laterality Date   ABDOMINAL HYSTERECTOMY  1993   COLONOSCOPY     POLYPECTOMY      Social History   Socioeconomic History   Marital status: Widowed    Spouse name: Not on file   Number of children: 0   Years of education: 14   Highest education level: Not on file  Occupational History    Employer: RETIRED  Tobacco Use   Smoking status: Every Day    Current packs/day: 1.50    Average packs/day: 1.5 packs/day for 49.0 years (73.5 ttl pk-yrs)    Types: Cigarettes   Smokeless tobacco: Never   Tobacco comments:    1/2 ppd 01/16/20        Patient is participating in health coaching for smoking cessation as of 04/13/23  Vaping Use   Vaping status: Never Used  Substance and Sexual Activity   Alcohol use: Yes    Alcohol/week: 1.0 standard drink of alcohol    Types: 1 Glasses of wine per week    Comment: 2-4 per week   Drug use: No   Sexual activity: Not Currently  Other Topics Concern   Not on file  Social History Narrative   Diet       Do you drink/eat things with caffeine Coffee      Marital Status Married What year were you married? 2015      Do you live in a house, apartment, assisted living, condo, trailer, etc.? Private House      Is it one or more stories? Yes 3 stories      How many persons live in your home? 2        Do you have any pets in your home?(please list) No      Highest level of education completed: 2 years college      Current or past profession: [employer]      Do you exercise?: Yes    Type and how often: Daily      Do you have a Living Will? (Form that indicates scenarios where you would not want your life prolonged) No      Do you have a DNR form?         If not, would you like to discuss one? No      Do you have signed POA/HPOA forms? No      Do you have difficulty bathing or dressing yourself? No      Do you have  difficulty preparing food or eating? No      Do you have difficulty managing medications? No      Do you have difficulty managing your finances? No      Do you have difficulty affording your medications? No                     Social Drivers of Dispensing optician  Resource Strain: Low Risk  (10/07/2023)   Overall Financial Resource Strain (CARDIA)    Difficulty of Paying Living Expenses: Not hard at all  Food Insecurity: No Food Insecurity (10/07/2023)   Hunger Vital Sign    Worried About Running Out of Food in the Last Year: Never true    Ran Out of Food in the Last Year: Never true  Transportation Needs: No Transportation Needs (10/07/2023)   PRAPARE - Administrator, Civil Service (Medical): No    Lack of Transportation (Non-Medical): No  Physical Activity: Insufficiently Active (10/07/2023)   Exercise Vital Sign    Days of Exercise per Week: 3 days    Minutes of Exercise per Session: 20 min  Stress: No Stress Concern Present (10/07/2023)   [employer] of Occupational Health - Occupational Stress Questionnaire    Feeling of Stress : Not at all  Social Connections: Moderately Isolated (10/07/2023)   Social Connection and Isolation Panel    Frequency of Communication with Friends and Family: More than three times a week    Frequency of Social Gatherings with Friends and Family: More than three times a week    Attends Religious Services: More than 4 times per year    Active Member of [employer] or Organizations: No    Attends Banker Meetings: Never    Marital Status: Widowed  Intimate Partner Violence: Not At Risk (10/07/2023)   Humiliation, Afraid, Rape, and Kick questionnaire    Fear of Current or Ex-Partner: No    Emotionally Abused: No    Physically Abused: No    Sexually Abused: No    Family History  Problem Relation Age of Onset   Diabetes Mother    Diabetes Father    Diabetes Other    Hypertension Sister    Hyperlipidemia Other    Heart  disease Sister        defibrillator   Kidney failure Maternal Aunt        dialysis   Diabetes Maternal Aunt    Hypertension Maternal Aunt    Diabetes Maternal Aunt    Heart Problems Maternal Grandmother    Cancer Maternal Grandmother    Heart disease Maternal Grandmother    Hypertension Maternal Grandmother    Diabetes Brother    Colon cancer Neg Hx    Colon polyps Neg Hx    Rectal cancer Neg Hx    Stomach cancer Neg Hx      Review of Systems  Constitutional: Negative.  Negative for chills and fever.  HENT: Negative.  Negative for congestion and sore throat.   Respiratory: Negative.  Negative for cough and shortness of breath.   Cardiovascular: Negative.  Negative for chest pain and palpitations.  Gastrointestinal:  Negative for abdominal pain, diarrhea, nausea and vomiting.  Genitourinary:  Positive for dysuria and vaginal discharge.       Vaginal discharge  Skin: Negative.  Negative for rash.  Neurological: Negative.  Negative for dizziness and headaches.  All other systems reviewed and are negative.   Vitals:   12/07/23 1540  BP: 138/76  Pulse: 96  Temp: 98.3 F (36.8 C)  SpO2: 99%    Physical Exam Vitals reviewed.  Constitutional:      Appearance: Normal appearance.  HENT:     Head: Normocephalic.  Eyes:     Extraocular Movements: Extraocular movements intact.  Cardiovascular:     Rate and Rhythm: Normal rate.  Pulmonary:     Effort: Pulmonary effort is normal.  Skin:    General: Skin is warm and dry.  Neurological:     Mental Status: She is alert and oriented to person, place, and time.  Psychiatric:        Mood and Affect: Mood normal.        Behavior: Behavior normal.    Results for orders placed or performed in visit on 12/07/23 (from the past 24 hours)  POCT Urinalysis Dipstick (Automated)     Status: Abnormal   Collection Time: 12/07/23  4:00 PM  Result Value Ref Range   Color, UA orange    Clarity, UA clear    Glucose, UA Negative  Negative   Bilirubin, UA negative    Ketones, UA negative    Spec Grav, UA >=1.030 (A) 1.010 - 1.025   Blood, UA negative    pH, UA 5.5 5.0 - 8.0   Protein, UA Positive (A) Negative   Urobilinogen, UA 0.2 0.2 or 1.0 E.U./dL   Nitrite, UA postive    Leukocytes, UA Negative Negative      ASSESSMENT & PLAN: A total of 32 minutes was spent with the patient and counseling/coordination of care regarding preparing for this visit, review of most recent office visit notes, review of all medications and chronic medical conditions under management, diagnosis of bacterial vaginosis and treatment, possible urinary tract infection, prognosis, documentation and need for follow-up if no better or worse during the next several days.  Problem List Items Addressed This Visit       Respiratory   Centrilobular emphysema (HCC)   Relevant Medications   Fluticasone -Umeclidin-Vilant (TRELEGY ELLIPTA ) 100-62.5-25 MCG/ACT AEPB     Genitourinary   Bacterial vaginosis - Primary   Clinically stable.  Not sexually active. Recommend to start Flagyl  500 mg twice a day for 7 days May need gynecological evaluation Advised to rest and stay well-hydrated Advised to contact the office if no better or worse during the next several days      Relevant Medications   metroNIDAZOLE  (FLAGYL ) 500 MG tablet     Other   Dysuria   Improved today.  Unremarkable urinalysis but may be partially treated infection.  Urine sent for culture today. Advised to rest and stay well-hydrated      Relevant Orders   POCT Urinalysis Dipstick (Automated) (Completed)   Urine Culture   Patient Instructions  Vaginal Infection (Bacterial Vaginosis): What to Know  Bacterial vaginosis is an infection of the vagina. It happens when the balance of normal germs (bacteria) in the vagina changes. If you don't get treated, it can make it easier for you to get other infections from sex. These are called sexually transmitted infections  (STIs). If you're pregnant, you need to get treated right away. This infection can cause a baby to be born early or at a low birth weight. What are the causes? This infection happens when too many harmful germs grow in the vagina. You can't get this infection from toilet seats, bedsheets, swimming pools, or things that touch your vagina. What increases the risk? Having sex with a new person or more than one person. Having sex without protection. Douching. Having an intrauterine device (IUD). Smoking. Using drugs or drinking alcohol. These can lead you to do risky things. Taking certain antibiotics. Being pregnant. What are the signs or symptoms? Some females have no symptoms. Symptoms may include: A gray or white discharge from your vagina. It can be watery or foamy. A fishy smell. This can happen after sex or during  your menstrual period. Itching in and around your vagina. Burning or pain when you pee. How is this treated? This infection is treated with antibiotics. These may be given to you as: A pill. A cream for your vagina. A medicine that you put into your vagina (suppository). If the infection comes back, you may need more antibiotics. Follow these instructions at home: Medicines Take your medicines as told. Take or use your antibiotics as told. Do not stop using them even if you start to feel better. General instructions If the person you have sex with is a female, tell her that you have this infection. She will need to follow up with her doctor. Female partners don't need to be treated. Do not have sex until you finish treatment. Drink more fluids as told. Keep your vagina and butt clean. Wash these areas with warm water each day. Wipe from front to back after you poop. If you're breastfeeding a baby, talk to your doctor if you should keep doing so during treatment. How is this prevented? Self-care Do not douche. Do not use deodorant sprays on your vagina. Wear cotton  underwear. Do not wear tight pants and pantyhose, especially in the summer. Safe sex Use condoms the correct way and every time you have sex. Use dental dams to protect yourself during oral sex. Limit how many people you have sex with. Get tested for STIs. The person you have sex with should also get tested. Drugs and alcohol Do not smoke, vape, or use nicotine  or tobacco. Do not use drugs. Limit the amount of alcohol you drink because it can lead you to do risky things. Where to find more information To learn more: Go to TonerPromos.no. Click Health Topics A-Z. Type bacterial vaginosis in the search bar. American Sexual Health Association (ASHA): ashasexualhealth.org U.S. Department of Health and [employer], Office on Women's Health: TravelLesson.ca Contact a doctor if: Your symptoms don't get better, even after treatment. You have more discharge or pain when you pee. You have a fever or chills. You have pain in your belly or in the area between your hips. You have pain during sex. You bleed from your vagina between menstrual periods. This information is not intended to replace advice given to you by your health care provider. Make sure you discuss any questions you have with your health care provider. Document Revised: 10/08/2022 Document Reviewed: 10/08/2022 Elsevier Patient Education  2024 Elsevier Inc.    Emil Schaumann, MD [facility]

## 2023-12-09 ENCOUNTER — Ambulatory Visit: Payer: Self-pay | Admitting: Emergency Medicine

## 2023-12-09 DIAGNOSIS — N390 Urinary tract infection, site not specified: Secondary | ICD-10-CM

## 2023-12-09 LAB — URINE CULTURE

## 2023-12-09 MED ORDER — CEFUROXIME AXETIL 250 MG PO TABS
250.0000 mg | ORAL_TABLET | Freq: Two times a day (BID) | ORAL | 0 refills | Status: AC
Start: 2023-12-09 — End: 2023-12-16

## 2024-01-13 ENCOUNTER — Other Ambulatory Visit: Payer: Self-pay | Admitting: Emergency Medicine

## 2024-01-13 DIAGNOSIS — N390 Urinary tract infection, site not specified: Secondary | ICD-10-CM

## 2024-02-17 ENCOUNTER — Ambulatory Visit: Admitting: Emergency Medicine

## 2024-02-17 ENCOUNTER — Encounter: Payer: Self-pay | Admitting: Emergency Medicine

## 2024-02-17 VITALS — BP 128/82 | HR 93 | Temp 97.9°F | Ht 65.0 in | Wt 98.5 lb

## 2024-02-17 DIAGNOSIS — M810 Age-related osteoporosis without current pathological fracture: Secondary | ICD-10-CM

## 2024-02-17 DIAGNOSIS — H6121 Impacted cerumen, right ear: Secondary | ICD-10-CM | POA: Insufficient documentation

## 2024-02-17 DIAGNOSIS — J432 Centrilobular emphysema: Secondary | ICD-10-CM | POA: Diagnosis not present

## 2024-02-17 DIAGNOSIS — I700 Atherosclerosis of aorta: Secondary | ICD-10-CM

## 2024-02-17 DIAGNOSIS — R0981 Nasal congestion: Secondary | ICD-10-CM

## 2024-02-17 DIAGNOSIS — R42 Dizziness and giddiness: Secondary | ICD-10-CM | POA: Diagnosis not present

## 2024-02-17 DIAGNOSIS — F17200 Nicotine dependence, unspecified, uncomplicated: Secondary | ICD-10-CM

## 2024-02-17 DIAGNOSIS — I10 Essential (primary) hypertension: Secondary | ICD-10-CM

## 2024-02-17 DIAGNOSIS — E785 Hyperlipidemia, unspecified: Secondary | ICD-10-CM

## 2024-02-17 NOTE — Assessment & Plan Note (Signed)
 Stable.  Continues to smoke. Continue daily Trelegy

## 2024-02-17 NOTE — Assessment & Plan Note (Signed)
 Clinically stable.  No red flag signs or symptoms. Multifactorial.  No flulike symptoms.  Doubt infectious process. Differential diagnosis discussed Occasional mild episodes.  No syncopal episodes.  No falls. Advised to monitor symptoms and contact the office if worse or clinical picture changes over the next several days.

## 2024-02-17 NOTE — Assessment & Plan Note (Signed)
 Perhaps contributing to right sided facial symptoms Irrigated successfully.  Hearing improved

## 2024-02-17 NOTE — Progress Notes (Signed)
 Terry Armstrong 78 y.o.   Chief Complaint  Patient presents with   Nasal Congestion    Patient here for right facial congestion and dizziness. Patient states she can't hear out of her right ear. Says its been going on for 3 days, no pain, chills. Patient was only taking Claritin D but has not helped     HISTORY OF PRESENT ILLNESS: This is a 78 y.o. female complaining of sinus congestion with occasional dizziness.  States all her symptoms are on the right side of her face.  Cannot hear out of her right ear Symptoms started about 3 days ago.  Denies pain.  Denies fever or chills. No other associated symptoms No other complaints or medical concerns today. BP Readings from Last 3 Encounters:  12/07/23 138/76  10/12/23 132/84  06/04/23 (!) 184/110     HPI   Prior to Admission medications   Medication Sig Start Date End Date Taking? Authorizing Provider  albuterol  (VENTOLIN  HFA) 108 (90 Base) MCG/ACT inhaler INHALE 2 PUFFS BY MOUTH EVERY 6 HOURS AS NEEDED FOR WHEEZING FOR SHORTNESS OF BREATH 11/17/23   Purcell Emil Schanz, MD  alendronate  (FOSAMAX ) 70 MG tablet Take one tablet once weekly. Take with a full glass of water on an empty stomach. 06/19/20   Ngetich, Dinah C, NP  aspirin  EC 81 MG tablet Take 1 tablet (81 mg total) by mouth daily. 06/28/19   Stallings, Zoe A, MD  atorvastatin  (LIPITOR) 40 MG tablet Take 1 tablet by mouth once daily 03/05/23   Gwyndolyn Guilford Jose, MD  Fluticasone -Umeclidin-Vilant (TRELEGY ELLIPTA ) 100-62.5-25 MCG/ACT AEPB Inhale 1 puff into the lungs daily. 12/07/23   Purcell Emil Schanz, MD  losartan  (COZAAR ) 50 MG tablet Take 1 tablet by mouth once daily 12/03/23   Chassidy Layson Jose, MD  metoprolol  succinate (TOPROL -XL) 25 MG 24 hr tablet Take half to whole tablet as needed in the evening for palpitations. 09/19/22   Walker, Caitlin S, NP  multivitamin-iron-minerals-folic acid (CENTRUM) chewable tablet Chew 1 tablet by mouth daily.    [provider]   Omega 3 1000 MG CAPS Take by mouth daily.    [provider]    No Known Allergies  Patient Active Problem List   Diagnosis Date Noted   Atypical lipoma of soft tissue 10/12/2023   Situational mixed anxiety and depressive disorder 01/21/2022   Aortic atherosclerosis 06/19/2020   Hx of adenomatous colonic polyps 06/19/2017   Multinodular goiter 03/15/2017   Centrilobular emphysema (HCC) 11/09/2016   Periodic limb movement sleep disorder 01/11/2015   Upper airway resistance syndrome 01/11/2015   Mild obstructive sleep apnea 01/11/2015   Osteoporosis 01/10/2015   Non-restorative sleep 05/23/2013   Primary snoring    HTN (hypertension) 05/11/2013   Current smoker 06/04/2011   Dyslipidemia 06/04/2011    Past Medical History:  Diagnosis Date   Allergy    Breast abscess    Constipation    if drinks alot of water no issues - uses womens stool softener PRN only    Diabetes mellitus without complication (HCC)    pt has no meds- diet controlled only    Exertional dyspnea 08/18/2019   H/O mammogram 05/06/2019   Per [facility] new patient packet   Hx of adenomatous colonic polyps 06/19/2017   Hypercholesteremia    Per [facility] new patient packet   Hyperlipidemia    Hypertension    Per [facility] new patient packet   Palpitations 08/18/2019   Primary snoring    Thyroid  condition  Per [facility] new patient packet   VT (ventricular tachycardia) (HCC) 09/11/2019    Past Surgical History:  Procedure Laterality Date   ABDOMINAL HYSTERECTOMY  1993   COLONOSCOPY     POLYPECTOMY      Social History   Socioeconomic History   Marital status: Widowed    Spouse name: Not on file   Number of children: 0   Years of education: 14   Highest education level: Not on file  Occupational History    Employer: RETIRED  Tobacco Use   Smoking status: Every Day    Current packs/day: 1.50    Average packs/day: 1.5 packs/day for 49.0 years (73.5 ttl pk-yrs)    Types: Cigarettes   Smokeless tobacco: Never    Tobacco comments:    1/2 ppd 01/16/20        Patient is participating in health coaching for smoking cessation as of 04/13/23  Vaping Use   Vaping status: Never Used  Substance and Sexual Activity   Alcohol use: Yes    Alcohol/week: 1.0 standard drink of alcohol    Types: 1 Glasses of wine per week    Comment: 2-4 per week   Drug use: No   Sexual activity: Not Currently  Other Topics Concern   Not on file  Social History Narrative   Diet       Do you drink/eat things with caffeine Coffee      Marital Status Married What year were you married? 2015      Do you live in a house, apartment, assisted living, condo, trailer, etc.? Private House      Is it one or more stories? Yes 3 stories      How many persons live in your home? 2        Do you have any pets in your home?(please list) No      Highest level of education completed: 2 years college      Current or past profession: [employer]      Do you exercise?: Yes    Type and how often: Daily      Do you have a Living Will? (Form that indicates scenarios where you would not want your life prolonged) No      Do you have a DNR form?         If not, would you like to discuss one? No      Do you have signed POA/HPOA forms? No      Do you have difficulty bathing or dressing yourself? No      Do you have difficulty preparing food or eating? No      Do you have difficulty managing medications? No      Do you have difficulty managing your finances? No      Do you have difficulty affording your medications? No                     Social Drivers of Corporate investment banker Strain: Low Risk  (10/07/2023)   Overall Financial Resource Strain (CARDIA)    Difficulty of Paying Living Expenses: Not hard at all  Food Insecurity: No Food Insecurity (10/07/2023)   Hunger Vital Sign    Worried About Running Out of Food in the Last Year: Never true    Ran Out of Food in the Last Year: Never true  Transportation Needs: No  Transportation Needs (10/07/2023)   PRAPARE - Administrator, Civil Service (  Medical): No    Lack of Transportation (Non-Medical): No  Physical Activity: Insufficiently Active (10/07/2023)   Exercise Vital Sign    Days of Exercise per Week: 3 days    Minutes of Exercise per Session: 20 min  Stress: No Stress Concern Present (10/07/2023)   [employer] of Occupational Health - Occupational Stress Questionnaire    Feeling of Stress : Not at all  Social Connections: Moderately Isolated (10/07/2023)   Social Connection and Isolation Panel    Frequency of Communication with Friends and Family: More than three times a week    Frequency of Social Gatherings with Friends and Family: More than three times a week    Attends Religious Services: More than 4 times per year    Active Member of [employer] or Organizations: No    Attends Banker Meetings: Never    Marital Status: Widowed  Intimate Partner Violence: Not At Risk (10/07/2023)   Humiliation, Afraid, Rape, and Kick questionnaire    Fear of Current or Ex-Partner: No    Emotionally Abused: No    Physically Abused: No    Sexually Abused: No    Family History  Problem Relation Age of Onset   Diabetes Mother    Diabetes Father    Diabetes Other    Hypertension Sister    Hyperlipidemia Other    Heart disease Sister        defibrillator   Kidney failure Maternal Aunt        dialysis   Diabetes Maternal Aunt    Hypertension Maternal Aunt    Diabetes Maternal Aunt    Heart Problems Maternal Grandmother    Cancer Maternal Grandmother    Heart disease Maternal Grandmother    Hypertension Maternal Grandmother    Diabetes Brother    Colon cancer Neg Hx    Colon polyps Neg Hx    Rectal cancer Neg Hx    Stomach cancer Neg Hx      Review of Systems  Constitutional: Negative.  Negative for chills and fever.  HENT:  Positive for congestion and hearing loss.   Eyes:  Negative for blurred vision and double vision.   Respiratory: Negative.  Negative for cough and shortness of breath.   Cardiovascular: Negative.  Negative for chest pain and palpitations.  Gastrointestinal:  Negative for abdominal pain, nausea and vomiting.  Genitourinary: Negative.  Negative for dysuria and hematuria.  Skin: Negative.  Negative for rash.  Neurological:  Positive for dizziness.  All other systems reviewed and are negative.   Today's Vitals   02/17/24 1257  BP: 128/82  Pulse: 93  Temp: 97.9 F (36.6 C)  TempSrc: Oral  SpO2: 98%  Weight: 98 lb 8 oz (44.7 kg)  Height: 5' 5 (1.651 m)   Body mass index is 16.39 kg/m.   Physical Exam Vitals reviewed.  Constitutional:      Appearance: Normal appearance.  HENT:     Right Ear: There is impacted cerumen.     Left Ear: Tympanic membrane, ear canal and external ear normal.     Mouth/Throat:     Mouth: Mucous membranes are moist.     Pharynx: Oropharynx is clear.  Eyes:     Extraocular Movements: Extraocular movements intact.     Pupils: Pupils are equal, round, and reactive to light.  Neck:     Vascular: No carotid bruit.  Cardiovascular:     Rate and Rhythm: Normal rate and regular rhythm.     Pulses: Normal  pulses.     Heart sounds: Normal heart sounds.  Pulmonary:     Effort: Pulmonary effort is normal.  Musculoskeletal:     Cervical back: No tenderness.  Lymphadenopathy:     Cervical: No cervical adenopathy.  Skin:    General: Skin is warm and dry.     Capillary Refill: Capillary refill takes less than 2 seconds.  Neurological:     General: No focal deficit present.     Mental Status: She is alert and oriented to person, place, and time.  Psychiatric:        Mood and Affect: Mood normal.        Behavior: Behavior normal.   After obtaining verbal consent from patient, right ear was irrigated with saline solution with successful removal of impacted earwax.  Tolerated procedure well.  No complications. Post procedure exam shows intact  eardrum.   ASSESSMENT & PLAN: A total of 42 minutes was spent with the patient and counseling/coordination of care regarding preparing for this visit, review of most recent office visit notes, review of multiple chronic medical conditions and their management, differential diagnosis of dizziness and management, review of all medications, review of most recent bloodwork results, review of health maintenance items, education on nutrition, prognosis, documentation, and need for follow up.   Problem List Items Addressed This Visit       Cardiovascular and Mediastinum   HTN (hypertension)   BP Readings from Last 3 Encounters:  02/17/24 128/82  12/07/23 138/76  10/12/23 132/84  Well-controlled hypertension Continue losartan  50 mg daily and metoprolol  succinate 25 mg daily Cardiovascular risk associated with hypertension discussed. Dietary approaches to stop hypertension discussed       Aortic atherosclerosis   Diet and nutrition discussed Continue atorvastatin  40 mg daily          Respiratory   Centrilobular emphysema (HCC)   Stable.  Continues to smoke. Continue daily Trelegy        Nervous and Auditory   Hearing loss due to cerumen impaction, right   Perhaps contributing to right sided facial symptoms Irrigated successfully.  Hearing improved        Musculoskeletal and Integument   Osteoporosis     Other   Current smoker   Cardiovascular and cancer risks associated with smoking discussed Smoking cessation advice given      Dyslipidemia   Presently not taking atorvastatin . Also has history of aortic atherosclerosis Resume atorvastatin  40 mg daily.      Dizziness - Primary   Clinically stable.  No red flag signs or symptoms. Multifactorial.  No flulike symptoms.  Doubt infectious process. Differential diagnosis discussed Occasional mild episodes.  No syncopal episodes.  No falls. Advised to monitor symptoms and contact the office if worse or clinical picture  changes over the next several days.      Patient Instructions  Dizziness Dizziness is a common problem. It makes you feel unsteady or light-headed. You may feel like you're about to faint. Dizziness can lead to getting hurt if you stumble or fall. It's more common to feel dizzy if you're an older adult. Many things can cause you to feel dizzy. These include: Medicines. Dehydration. This is when there's not enough water in your body. Illness. Follow these instructions at home: Eating and drinking  Drink enough fluid to keep your pee (urine) pale yellow. This helps keep you from getting dehydrated. Try to drink more clear fluids, such as water. Do not drink alcohol. Try to limit how  much caffeine you take in. Try to limit how much salt, also called sodium, you take in. Activity Try not to make quick movements. Stand up slowly from sitting in a chair. Steady yourself until you feel okay. In the morning, first sit up on the side of the bed. When you feel okay, hold onto something and slowly stand up. Do this until you know that your balance is okay. If you need to stand in one place for a long time, move your legs often. Tighten and relax the muscles in your legs while you're standing. Do not drive or use machines if you feel dizzy. Avoid bending down if you feel dizzy. Place items in your home so you can reach them without leaning over. Lifestyle Do not smoke, vape, or use products with nicotine  or tobacco in them. If you need help quitting, talk with your health care provider. Try to lower your stress level. You can do this by using methods like yoga or meditation. Talk with your provider if you need help. General instructions Watch your dizziness for any changes. Take your medicines only as told by your provider. Talk with your provider if you think you're dizzy because of a medicine you're taking. Tell a friend or a family member that you're feeling dizzy. If they spot any changes in  your behavior, have them call your provider. Contact a health care provider if: Your dizziness doesn't go away, or you have new symptoms. Your dizziness gets worse. You feel like you may vomit. You have trouble hearing. You have a fever. You have neck pain or a stiff neck. You fall or get hurt. Get help right away if: You vomit each time you eat or drink. You have watery poop and can't eat or drink. You have trouble talking, walking, swallowing, or using your arms, hands, or legs. You feel very weak. You're bleeding. You're not thinking clearly, or you have trouble forming sentences. A friend or family member may spot this. Your vision changes, or you get a very bad headache. These symptoms may be an emergency. Call 911 right away. Do not wait to see if the symptoms will go away. Do not drive yourself to the hospital. This information is not intended to replace advice given to you by your health care provider. Make sure you discuss any questions you have with your health care provider. Document Revised: 01/22/2023 Document Reviewed: 06/05/2022 Elsevier Patient Education  2024 Elsevier Inc.   Emil Schaumann, MD [facility]

## 2024-02-17 NOTE — Patient Instructions (Signed)
 Dizziness Dizziness is a common problem. It makes you feel unsteady or light-headed. You may feel like you're about to faint. Dizziness can lead to getting hurt if you stumble or fall. It's more common to feel dizzy if you're an older adult. Many things can cause you to feel dizzy. These include: Medicines. Dehydration. This is when there's not enough water in your body. Illness. Follow these instructions at home: Eating and drinking  Drink enough fluid to keep your pee (urine) pale yellow. This helps keep you from getting dehydrated. Try to drink more clear fluids, such as water. Do not drink alcohol. Try to limit how much caffeine you take in. Try to limit how much salt, also called sodium, you take in. Activity Try not to make quick movements. Stand up slowly from sitting in a chair. Steady yourself until you feel okay. In the morning, first sit up on the side of the bed. When you feel okay, hold onto something and slowly stand up. Do this until you know that your balance is okay. If you need to stand in one place for a long time, move your legs often. Tighten and relax the muscles in your legs while you're standing. Do not drive or use machines if you feel dizzy. Avoid bending down if you feel dizzy. Place items in your home so you can reach them without leaning over. Lifestyle Do not smoke, vape, or use products with nicotine or tobacco in them. If you need help quitting, talk with your health care provider. Try to lower your stress level. You can do this by using methods like yoga or meditation. Talk with your provider if you need help. General instructions Watch your dizziness for any changes. Take your medicines only as told by your provider. Talk with your provider if you think you're dizzy because of a medicine you're taking. Tell a friend or a family member that you're feeling dizzy. If they spot any changes in your behavior, have them call your provider. Contact a health care  provider if: Your dizziness doesn't go away, or you have new symptoms. Your dizziness gets worse. You feel like you may vomit. You have trouble hearing. You have a fever. You have neck pain or a stiff neck. You fall or get hurt. Get help right away if: You vomit each time you eat or drink. You have watery poop and can't eat or drink. You have trouble talking, walking, swallowing, or using your arms, hands, or legs. You feel very weak. You're bleeding. You're not thinking clearly, or you have trouble forming sentences. A friend or family member may spot this. Your vision changes, or you get a very bad headache. These symptoms may be an emergency. Call 911 right away. Do not wait to see if the symptoms will go away. Do not drive yourself to the hospital. This information is not intended to replace advice given to you by your health care provider. Make sure you discuss any questions you have with your health care provider. Document Revised: 01/22/2023 Document Reviewed: 06/05/2022 Elsevier Patient Education  2024 [employer].

## 2024-02-17 NOTE — Assessment & Plan Note (Signed)
 BP Readings from Last 3 Encounters:  02/17/24 128/82  12/07/23 138/76  10/12/23 132/84  Well-controlled hypertension Continue losartan  50 mg daily and metoprolol  succinate 25 mg daily Cardiovascular risk associated with hypertension discussed. Dietary approaches to stop hypertension discussed

## 2024-02-17 NOTE — Assessment & Plan Note (Signed)
 Diet and nutrition discussed. Continue atorvastatin 40 mg daily.

## 2024-02-17 NOTE — Assessment & Plan Note (Signed)
 Cardiovascular and cancer risks associated with smoking discussed. Smoking cessation advice given.

## 2024-02-17 NOTE — Assessment & Plan Note (Signed)
 Presently not taking atorvastatin . Also has history of aortic atherosclerosis Resume atorvastatin  40 mg daily.

## 2024-04-12 ENCOUNTER — Encounter: Payer: Self-pay | Admitting: Emergency Medicine

## 2024-04-12 ENCOUNTER — Ambulatory Visit: Payer: Self-pay | Admitting: Emergency Medicine

## 2024-04-12 ENCOUNTER — Ambulatory Visit: Admitting: Emergency Medicine

## 2024-04-12 VITALS — BP 124/82 | HR 75 | Temp 97.6°F | Ht 65.0 in | Wt 100.0 lb

## 2024-04-12 DIAGNOSIS — I10 Essential (primary) hypertension: Secondary | ICD-10-CM

## 2024-04-12 DIAGNOSIS — R7303 Prediabetes: Secondary | ICD-10-CM | POA: Insufficient documentation

## 2024-04-12 DIAGNOSIS — R8290 Unspecified abnormal findings in urine: Secondary | ICD-10-CM | POA: Insufficient documentation

## 2024-04-12 DIAGNOSIS — I700 Atherosclerosis of aorta: Secondary | ICD-10-CM

## 2024-04-12 DIAGNOSIS — J432 Centrilobular emphysema: Secondary | ICD-10-CM

## 2024-04-12 DIAGNOSIS — M810 Age-related osteoporosis without current pathological fracture: Secondary | ICD-10-CM

## 2024-04-12 DIAGNOSIS — E785 Hyperlipidemia, unspecified: Secondary | ICD-10-CM

## 2024-04-12 DIAGNOSIS — F17200 Nicotine dependence, unspecified, uncomplicated: Secondary | ICD-10-CM

## 2024-04-12 LAB — URINALYSIS, ROUTINE W REFLEX MICROSCOPIC
Hgb urine dipstick: NEGATIVE
Ketones, ur: NEGATIVE
Nitrite: NEGATIVE
Specific Gravity, Urine: 1.015 (ref 1.000–1.030)
Urine Glucose: NEGATIVE
Urobilinogen, UA: 2 — AB (ref 0.0–1.0)
pH: 8.5 — AB (ref 5.0–8.0)

## 2024-04-12 LAB — COMPREHENSIVE METABOLIC PANEL WITH GFR
ALT: 14 U/L (ref 0–35)
AST: 18 U/L (ref 0–37)
Albumin: 4.3 g/dL (ref 3.5–5.2)
Alkaline Phosphatase: 66 U/L (ref 39–117)
BUN: 10 mg/dL (ref 6–23)
CO2: 28 meq/L (ref 19–32)
Calcium: 9.5 mg/dL (ref 8.4–10.5)
Chloride: 106 meq/L (ref 96–112)
Creatinine, Ser: 0.65 mg/dL (ref 0.40–1.20)
GFR: 83.94 mL/min (ref >60.00)
Glucose, Bld: 104 mg/dL — ABNORMAL HIGH (ref 70–99)
Potassium: 4.3 meq/L (ref 3.5–5.1)
Sodium: 141 meq/L (ref 135–145)
Total Bilirubin: 0.6 mg/dL (ref 0.2–1.2)
Total Protein: 6.9 g/dL (ref 6.0–8.3)

## 2024-04-12 LAB — CBC WITH DIFFERENTIAL/PLATELET
Basophils Absolute: 0.1 K/uL (ref 0.0–0.1)
Basophils Relative: 1.2 % (ref 0.0–3.0)
Eosinophils Absolute: 0.2 K/uL (ref 0.0–0.7)
Eosinophils Relative: 3.5 % (ref 0.0–5.0)
HCT: 42.9 % (ref 36.0–46.0)
Hemoglobin: 14.5 g/dL (ref 12.0–15.0)
Lymphocytes Relative: 24.4 % (ref 12.0–46.0)
Lymphs Abs: 1.3 K/uL (ref 0.7–4.0)
MCHC: 33.8 g/dL (ref 30.0–36.0)
MCV: 98.2 fl (ref 78.0–100.0)
Monocytes Absolute: 0.5 K/uL (ref 0.1–1.0)
Monocytes Relative: 9.9 % (ref 3.0–12.0)
Neutro Abs: 3.3 K/uL (ref 1.4–7.7)
Neutrophils Relative %: 61 % (ref 43.0–77.0)
Platelets: 262 K/uL (ref 150.0–400.0)
RBC: 4.37 Mil/uL (ref 3.87–5.11)
RDW: 14.5 % (ref 11.5–15.5)
WBC: 5.4 K/uL (ref 4.0–10.5)

## 2024-04-12 LAB — LIPID PANEL
Cholesterol: 178 mg/dL (ref 0–200)
HDL: 75.1 mg/dL (ref >39.00)
LDL Cholesterol: 88 mg/dL (ref 0–99)
NonHDL: 102.5
Total CHOL/HDL Ratio: 2
Triglycerides: 73 mg/dL (ref 0.0–149.0)
VLDL: 14.6 mg/dL (ref 0.0–40.0)

## 2024-04-12 LAB — POCT GLYCOSYLATED HEMOGLOBIN (HGB A1C): HbA1c POC (<> result, manual entry): 5.5 % (ref 4.0–5.6)

## 2024-04-12 MED ORDER — ALBUTEROL SULFATE HFA 108 (90 BASE) MCG/ACT IN AERS: INHALATION_SPRAY | RESPIRATORY_TRACT | 0 refills | Status: DC

## 2024-04-12 MED ORDER — ATORVASTATIN CALCIUM 40 MG PO TABS: 40.0000 mg | ORAL_TABLET | Freq: Every day | ORAL | 0 refills | Status: AC

## 2024-04-12 NOTE — Assessment & Plan Note (Signed)
Stable chronic condition.  Continue atorvastatin 40 mg daily.

## 2024-04-12 NOTE — Assessment & Plan Note (Signed)
 BP Readings from Last 3 Encounters:  04/12/24 124/82  02/17/24 128/82  12/07/23 138/76  Well-controlled hypertension Continue losartan  50 mg daily and metoprolol  succinate 25 mg daily Cardiovascular risk associated with hypertension discussed. Dietary approaches to stop hypertension discussed

## 2024-04-12 NOTE — Patient Instructions (Signed)
 Health Maintenance After Age 79 After age 27, you are at a higher risk for certain long-term diseases and infections as well as injuries from falls. Falls are a major cause of broken bones and head injuries in people who are older than age 73. Getting regular preventive care can help to keep you healthy and well. Preventive care includes getting regular testing and making lifestyle changes as recommended by your health care provider. Talk with your health care provider about: Which screenings and tests you should have. A screening is a test that checks for a disease when you have no symptoms. A diet and exercise plan that is right for you. What should I know about screenings and tests to prevent falls? Screening and testing are the best ways to find a health problem early. Early diagnosis and treatment give you the best chance of managing medical conditions that are common after age 90. Certain conditions and lifestyle choices may make you more likely to have a fall. Your health care provider may recommend: Regular vision checks. Poor vision and conditions such as cataracts can make you more likely to have a fall. If you wear glasses, make sure to get your prescription updated if your vision changes. Medicine review. Work with your health care provider to regularly review all of the medicines you are taking, including over-the-counter medicines. Ask your health care provider about any side effects that may make you more likely to have a fall. Tell your health care provider if any medicines that you take make you feel dizzy or sleepy. Strength and balance checks. Your health care provider may recommend certain tests to check your strength and balance while standing, walking, or changing positions. Foot health exam. Foot pain and numbness, as well as not wearing proper footwear, can make you more likely to have a fall. Screenings, including: Osteoporosis screening. Osteoporosis is a condition that causes  the bones to get weaker and break more easily. Blood pressure screening. Blood pressure changes and medicines to control blood pressure can make you feel dizzy. Depression screening. You may be more likely to have a fall if you have a fear of falling, feel depressed, or feel unable to do activities that you used to do. Alcohol  use screening. Using too much alcohol  can affect your balance and may make you more likely to have a fall. Follow these instructions at home: Lifestyle Do not drink alcohol  if: Your health care provider tells you not to drink. If you drink alcohol : Limit how much you have to: 0-1 drink a day for women. 0-2 drinks a day for men. Know how much alcohol  is in your drink. In the [place] one drink equals one 12 oz bottle of beer (355 mL), one 5 oz glass of wine (148 mL), or one 1 oz glass of hard liquor (44 mL). Do not use any products that contain nicotine or tobacco. These products include cigarettes, chewing tobacco, and vaping devices, such as e-cigarettes. If you need help quitting, ask your health care provider. Activity  Follow a regular exercise program to stay fit. This will help you maintain your balance. Ask your health care provider what types of exercise are appropriate for you. If you need a cane or walker, use it as recommended by your health care provider. Wear supportive shoes that have nonskid soles. Safety  Remove any tripping hazards, such as rugs, cords, and clutter. Install safety equipment such as grab bars in bathrooms and safety rails on stairs. Keep rooms and walkways  well-lit. General instructions Talk with your health care provider about your risks for falling. Tell your health care provider if: You fall. Be sure to tell your health care provider about all falls, even ones that seem minor. You feel dizzy, tiredness (fatigue), or off-balance. Take over-the-counter and prescription medicines only as told by your health care provider. These include  supplements. Eat a healthy diet and maintain a healthy weight. A healthy diet includes low-fat dairy products, low-fat (lean) meats, and fiber from whole grains, beans, and lots of fruits and vegetables. Stay current with your vaccines. Schedule regular health, dental, and eye exams. Summary Having a healthy lifestyle and getting preventive care can help to protect your health and wellness after age 15. Screening and testing are the best way to find a health problem early and help you avoid having a fall. Early diagnosis and treatment give you the best chance for managing medical conditions that are more common for people who are older than age 42. Falls are a major cause of broken bones and head injuries in people who are older than age 64. Take precautions to prevent a fall at home. Work with your health care provider to learn what changes you can make to improve your health and wellness and to prevent falls. This information is not intended to replace advice given to you by your health care provider. Make sure you discuss any questions you have with your health care provider. Document Revised: 09/10/2020 Document Reviewed: 09/10/2020 Elsevier Patient Education  2024 [employer].

## 2024-04-12 NOTE — Assessment & Plan Note (Signed)
 Lab Results  Component Value Date   HGBA1C 5.9 10/12/2023  Diet and nutrition discussed Stable chronic condition.

## 2024-04-12 NOTE — Assessment & Plan Note (Signed)
Continues weekly Fosamax 70 mg

## 2024-04-12 NOTE — Assessment & Plan Note (Signed)
 Stable.  Continues to smoke. Continue daily Trelegy

## 2024-04-12 NOTE — Assessment & Plan Note (Signed)
 Diet and nutrition discussed. Continue atorvastatin 40 mg daily.

## 2024-04-12 NOTE — Assessment & Plan Note (Signed)
 Recommend urinalysis and urine culture today. Stable. No signs of pyelonephritis.

## 2024-04-12 NOTE — Progress Notes (Signed)
 Terry Armstrong 78 y.o.   Chief Complaint  Patient presents with   Follow-up    Strong urine smell first thing in the morning and calluses on her hand     HISTORY OF PRESENT ILLNESS: This is a 78 y.o. female A1A here for follow-up of multiple chronic medical conditions Also complaining of foul-smelling urine mostly in the morning No other complaints or medical concerns today.  HPI   Prior to Admission medications   Medication Sig Start Date End Date Taking? Authorizing Provider  albuterol  (VENTOLIN  HFA) 108 (90 Base) MCG/ACT inhaler INHALE 2 PUFFS BY MOUTH EVERY 6 HOURS AS NEEDED FOR WHEEZING FOR SHORTNESS OF BREATH 11/17/23  Yes Kandise Riehle, Emil Schanz, MD  alendronate  (FOSAMAX ) 70 MG tablet Take one tablet once weekly. Take with a full glass of water on an empty stomach. 06/19/20  Yes Ngetich, Dinah C, NP  atorvastatin  (LIPITOR) 40 MG tablet Take 1 tablet by mouth once daily 03/05/23  Yes Kaleb Linquist, Emil Schanz, MD  Fluticasone -Umeclidin-Vilant (TRELEGY ELLIPTA ) 100-62.5-25 MCG/ACT AEPB Inhale 1 puff into the lungs daily. 12/07/23  Yes Louvina Cleary, Emil Schanz, MD  losartan  (COZAAR ) 50 MG tablet Take 1 tablet by mouth once daily 12/03/23  Yes Liley Rake Jose, MD  metoprolol  succinate (TOPROL -XL) 25 MG 24 hr tablet Take half to whole tablet as needed in the evening for palpitations. 09/19/22  Yes Vannie Reche RAMAN, NP  multivitamin-iron-minerals-folic acid (CENTRUM) chewable tablet Chew 1 tablet by mouth daily.   Yes [provider]  Omega 3 1000 MG CAPS Take by mouth daily.   Yes [provider]  aspirin  EC 81 MG tablet Take 1 tablet (81 mg total) by mouth daily. Patient not taking: Reported on 04/12/2024 06/28/19   Bettie Santana LABOR, MD    No Known Allergies  Patient Active Problem List   Diagnosis Date Noted   Situational mixed anxiety and depressive disorder 01/21/2022   Aortic atherosclerosis 06/19/2020   Hx of adenomatous colonic polyps 06/19/2017   Multinodular  goiter 03/15/2017   Centrilobular emphysema (HCC) 11/09/2016   Periodic limb movement sleep disorder 01/11/2015   Upper airway resistance syndrome 01/11/2015   Mild obstructive sleep apnea 01/11/2015   Osteoporosis 01/10/2015   Non-restorative sleep 05/23/2013   Primary snoring    HTN (hypertension) 05/11/2013   Current smoker 06/04/2011   Dyslipidemia 06/04/2011    Past Medical History:  Diagnosis Date   Allergy    Breast abscess    Constipation    if drinks alot of water no issues - uses womens stool softener PRN only    Diabetes mellitus without complication (HCC)    pt has no meds- diet controlled only    Exertional dyspnea 08/18/2019   H/O mammogram 05/06/2019   Per [facility] new patient packet   Hx of adenomatous colonic polyps 06/19/2017   Hypercholesteremia    Per [facility] new patient packet   Hyperlipidemia    Hypertension    Per [facility] new patient packet   Palpitations 08/18/2019   Primary snoring    Thyroid  condition    Per [facility] new patient packet   VT (ventricular tachycardia) (HCC) 09/11/2019    Past Surgical History:  Procedure Laterality Date   ABDOMINAL HYSTERECTOMY  1993   COLONOSCOPY     POLYPECTOMY      Social History   Socioeconomic History   Marital status: Widowed    Spouse name: Not on file   Number of children: 0   Years of education: 14   Highest  education level: GED or equivalent  Occupational History    Employer: RETIRED  Tobacco Use   Smoking status: Every Day    Current packs/day: 1.50    Average packs/day: 1.5 packs/day for 49.0 years (73.5 ttl pk-yrs)    Types: Cigarettes   Smokeless tobacco: Never   Tobacco comments:    1/2 ppd 01/16/20        Patient is participating in health coaching for smoking cessation as of 04/13/23  Vaping Use   Vaping status: Never Used  Substance and Sexual Activity   Alcohol use: Yes    Alcohol/week: 1.0 standard drink of alcohol    Types: 1 Glasses of wine per week    Comment: 2-4 per week   Drug use: No    Sexual activity: Not Currently  Other Topics Concern   Not on file  Social History Narrative   Diet       Do you drink/eat things with caffeine Coffee      Marital Status Married What year were you married? 2015      Do you live in a house, apartment, assisted living, condo, trailer, etc.? Private House      Is it one or more stories? Yes 3 stories      How many persons live in your home? 2        Do you have any pets in your home?(please list) No      Highest level of education completed: 2 years college      Current or past profession: [employer]      Do you exercise?: Yes    Type and how often: Daily      Do you have a Living Will? (Form that indicates scenarios where you would not want your life prolonged) No      Do you have a DNR form?         If not, would you like to discuss one? No      Do you have signed POA/HPOA forms? No      Do you have difficulty bathing or dressing yourself? No      Do you have difficulty preparing food or eating? No      Do you have difficulty managing medications? No      Do you have difficulty managing your finances? No      Do you have difficulty affording your medications? No                     Social Drivers of Corporate Investment Banker Strain: Low Risk  (04/11/2024)   Overall Financial Resource Strain (CARDIA)    Difficulty of Paying Living Expenses: Not hard at all  Food Insecurity: No Food Insecurity (04/11/2024)   Hunger Vital Sign    Worried About Running Out of Food in the Last Year: Never true    Ran Out of Food in the Last Year: Never true  Transportation Needs: No Transportation Needs (04/11/2024)   PRAPARE - Administrator, Civil Service (Medical): No    Lack of Transportation (Non-Medical): No  Physical Activity: Insufficiently Active (10/07/2023)   Exercise Vital Sign    Days of Exercise per Week: 3 days    Minutes of Exercise per Session: 20 min  Stress: No Stress Concern Present (10/07/2023)    [employer] of Occupational Health - Occupational Stress Questionnaire    Feeling of Stress : Not at all  Social Connections: Moderately Integrated (04/11/2024)  Social Connection and Isolation Panel    Frequency of Communication with Friends and Family: More than three times a week    Frequency of Social Gatherings with Friends and Family: More than three times a week    Attends Religious Services: More than 4 times per year    Active Member of [employer] or Organizations: Yes    Attends Banker Meetings: Not on file    Marital Status: Widowed  Intimate Partner Violence: Not At Risk (10/07/2023)   Humiliation, Afraid, Rape, and Kick questionnaire    Fear of Current or Ex-Partner: No    Emotionally Abused: No    Physically Abused: No    Sexually Abused: No    Family History  Problem Relation Age of Onset   Diabetes Mother    Diabetes Father    Diabetes Other    Hypertension Sister    Hyperlipidemia Other    Heart disease Sister        defibrillator   Kidney failure Maternal Aunt        dialysis   Diabetes Maternal Aunt    Hypertension Maternal Aunt    Diabetes Maternal Aunt    Heart Problems Maternal Grandmother    Cancer Maternal Grandmother    Heart disease Maternal Grandmother    Hypertension Maternal Grandmother    Diabetes Brother    Colon cancer Neg Hx    Colon polyps Neg Hx    Rectal cancer Neg Hx    Stomach cancer Neg Hx      Review of Systems  Constitutional: Negative.  Negative for chills and fever.  HENT: Negative.  Negative for congestion and sore throat.   Respiratory: Negative.  Negative for cough and shortness of breath.   Cardiovascular: Negative.  Negative for chest pain and palpitations.  Gastrointestinal:  Negative for abdominal pain, diarrhea, nausea and vomiting.  Genitourinary: Negative.  Negative for dysuria and hematuria.  Skin: Negative.  Negative for rash.  Neurological: Negative.  Negative for dizziness and headaches.  All  other systems reviewed and are negative.   Today's Vitals   04/12/24 0958  BP: 124/82  Pulse: 75  Temp: 97.6 F (36.4 C)  TempSrc: Oral  SpO2: 98%  Weight: 100 lb (45.4 kg)  Height: 5' 5 (1.651 m)   Body mass index is 16.64 kg/m.   Physical Exam Vitals reviewed.  Constitutional:      Appearance: Normal appearance.  HENT:     Head: Normocephalic.     Mouth/Throat:     Mouth: Mucous membranes are moist.     Pharynx: Oropharynx is clear.  Eyes:     Extraocular Movements: Extraocular movements intact.     Pupils: Pupils are equal, round, and reactive to light.  Cardiovascular:     Rate and Rhythm: Normal rate.  Pulmonary:     Effort: Pulmonary effort is normal.  Musculoskeletal:     Cervical back: No tenderness.  Lymphadenopathy:     Cervical: No cervical adenopathy.  Skin:    General: Skin is warm and dry.  Neurological:     General: No focal deficit present.     Mental Status: She is alert and oriented to person, place, and time.  Psychiatric:        Mood and Affect: Mood normal.        Behavior: Behavior normal.    Results for orders placed or performed in visit on 04/12/24 (from the past 24 hours)  POCT HgB A1C  Status: Normal   Collection Time: 04/12/24 12:53 PM  Result Value Ref Range   Hemoglobin A1C     HbA1c POC (<> result, manual entry) 5.5 4.0 - 5.6 %   HbA1c, POC (prediabetic range)     HbA1c, POC (controlled diabetic range)       ASSESSMENT & PLAN: A total of 43 minutes was spent with the patient and counseling/coordination of care regarding preparing for this visit, review of most recent office visit notes, review of multiple chronic medical conditions and their management, review of all medications, review of most recent bloodwork results, review of health maintenance items, education on nutrition, prognosis, documentation, and need for follow up.   Problem List Items Addressed This Visit       Cardiovascular and Mediastinum   HTN  (hypertension) - Primary   BP Readings from Last 3 Encounters:  04/12/24 124/82  02/17/24 128/82  12/07/23 138/76  Well-controlled hypertension Continue losartan  50 mg daily and metoprolol  succinate 25 mg daily Cardiovascular risk associated with hypertension discussed. Dietary approaches to stop hypertension discussed        Relevant Medications   atorvastatin  (LIPITOR) 40 MG tablet   Other Relevant Orders   CBC with Differential/Platelet   Comprehensive metabolic panel with GFR   Lipid panel   Aortic atherosclerosis   Diet and nutrition discussed Continue atorvastatin  40 mg daily        Relevant Medications   atorvastatin  (LIPITOR) 40 MG tablet   Other Relevant Orders   Lipid panel     Respiratory   Centrilobular emphysema (HCC)   Stable.  Continues to smoke. Continue daily Trelegy      Relevant Medications   albuterol  (VENTOLIN  HFA) 108 (90 Base) MCG/ACT inhaler   Other Relevant Orders   CBC with Differential/Platelet     Musculoskeletal and Integument   Osteoporosis   Continues weekly Fosamax  70 mg        Other   Current smoker   Continues to smoke about 10 cigarettes/day Cardiovascular and cancer risks associated with smoking discussed Smoking cessation advice given      Dyslipidemia   Stable chronic condition. Continue atorvastatin  40 mg daily.      Relevant Medications   atorvastatin  (LIPITOR) 40 MG tablet   Other Relevant Orders   Comprehensive metabolic panel with GFR   Lipid panel   Foul smelling urine   Recommend urinalysis and urine culture today. Stable. No signs of pyelonephritis.      Relevant Orders   Urine Culture   Urinalysis   Prediabetes   Lab Results  Component Value Date   HGBA1C 5.9 10/12/2023  Diet and nutrition discussed Stable chronic condition.       Relevant Orders   POCT HgB A1C   Patient Instructions  Health Maintenance After Age 60 After age 41, you are at a higher risk for certain long-term diseases  and infections as well as injuries from falls. Falls are a major cause of broken bones and head injuries in people who are older than age 96. Getting regular preventive care can help to keep you healthy and well. Preventive care includes getting regular testing and making lifestyle changes as recommended by your health care provider. Talk with your health care provider about: Which screenings and tests you should have. A screening is a test that checks for a disease when you have no symptoms. A diet and exercise plan that is right for you. What should I know about screenings and tests to  prevent falls? Screening and testing are the best ways to find a health problem early. Early diagnosis and treatment give you the best chance of managing medical conditions that are common after age 23. Certain conditions and lifestyle choices may make you more likely to have a fall. Your health care provider may recommend: Regular vision checks. Poor vision and conditions such as cataracts can make you more likely to have a fall. If you wear glasses, make sure to get your prescription updated if your vision changes. Medicine review. Work with your health care provider to regularly review all of the medicines you are taking, including over-the-counter medicines. Ask your health care provider about any side effects that may make you more likely to have a fall. Tell your health care provider if any medicines that you take make you feel dizzy or sleepy. Strength and balance checks. Your health care provider may recommend certain tests to check your strength and balance while standing, walking, or changing positions. Foot health exam. Foot pain and numbness, as well as not wearing proper footwear, can make you more likely to have a fall. Screenings, including: Osteoporosis screening. Osteoporosis is a condition that causes the bones to get weaker and break more easily. Blood pressure screening. Blood pressure changes and  medicines to control blood pressure can make you feel dizzy. Depression screening. You may be more likely to have a fall if you have a fear of falling, feel depressed, or feel unable to do activities that you used to do. Alcohol use screening. Using too much alcohol can affect your balance and may make you more likely to have a fall. Follow these instructions at home: Lifestyle Do not drink alcohol if: Your health care provider tells you not to drink. If you drink alcohol: Limit how much you have to: 0-1 drink a day for women. 0-2 drinks a day for men. Know how much alcohol is in your drink. In the [place] one drink equals one 12 oz bottle of beer (355 mL), one 5 oz glass of wine (148 mL), or one 1 oz glass of hard liquor (44 mL). Do not use any products that contain nicotine  or tobacco. These products include cigarettes, chewing tobacco, and vaping devices, such as e-cigarettes. If you need help quitting, ask your health care provider. Activity  Follow a regular exercise program to stay fit. This will help you maintain your balance. Ask your health care provider what types of exercise are appropriate for you. If you need a cane or walker, use it as recommended by your health care provider. Wear supportive shoes that have nonskid soles. Safety  Remove any tripping hazards, such as rugs, cords, and clutter. Install safety equipment such as grab bars in bathrooms and safety rails on stairs. Keep rooms and walkways well-lit. General instructions Talk with your health care provider about your risks for falling. Tell your health care provider if: You fall. Be sure to tell your health care provider about all falls, even ones that seem minor. You feel dizzy, tiredness (fatigue), or off-balance. Take over-the-counter and prescription medicines only as told by your health care provider. These include supplements. Eat a healthy diet and maintain a healthy weight. A healthy diet includes low-fat  dairy products, low-fat (lean) meats, and fiber from whole grains, beans, and lots of fruits and vegetables. Stay current with your vaccines. Schedule regular health, dental, and eye exams. Summary Having a healthy lifestyle and getting preventive care can help to protect your health and wellness  after age 17. Screening and testing are the best way to find a health problem early and help you avoid having a fall. Early diagnosis and treatment give you the best chance for managing medical conditions that are more common for people who are older than age 24. Falls are a major cause of broken bones and head injuries in people who are older than age 38. Take precautions to prevent a fall at home. Work with your health care provider to learn what changes you can make to improve your health and wellness and to prevent falls. This information is not intended to replace advice given to you by your health care provider. Make sure you discuss any questions you have with your health care provider. Document Revised: 09/10/2020 Document Reviewed: 09/10/2020 Elsevier Patient Education  2024 Elsevier Inc[employer]      Emil Schaumann, MD [facility]

## 2024-04-12 NOTE — Assessment & Plan Note (Signed)
 Continues to smoke about 10 cigarettes/day Cardiovascular and cancer risks associated with smoking discussed Smoking cessation advice given

## 2024-04-14 LAB — URINE CULTURE

## 2024-04-14 MED ORDER — CEFUROXIME AXETIL 250 MG PO TABS: 250.0000 mg | ORAL_TABLET | Freq: Two times a day (BID) | ORAL | 0 refills | Status: AC

## 2024-06-06 ENCOUNTER — Other Ambulatory Visit: Payer: Self-pay | Admitting: Emergency Medicine

## 2024-06-06 DIAGNOSIS — J432 Centrilobular emphysema: Secondary | ICD-10-CM

## 2024-10-11 ENCOUNTER — Ambulatory Visit
